# Patient Record
Sex: Female | Born: 1990 | Race: White | Hispanic: No | Marital: Single | Smoking: Former smoker
Health system: Southern US, Community
[De-identification: ages and names within clinical notes are randomized; demographics above are authoritative.]

## PROBLEM LIST (undated history)

## (undated) DIAGNOSIS — J189 Pneumonia, unspecified organism: Secondary | ICD-10-CM

## (undated) DIAGNOSIS — N390 Urinary tract infection, site not specified: Secondary | ICD-10-CM

## (undated) DIAGNOSIS — F419 Anxiety disorder, unspecified: Secondary | ICD-10-CM

## (undated) DIAGNOSIS — N200 Calculus of kidney: Secondary | ICD-10-CM

## (undated) DIAGNOSIS — D649 Anemia, unspecified: Secondary | ICD-10-CM

## (undated) DIAGNOSIS — J45909 Unspecified asthma, uncomplicated: Secondary | ICD-10-CM

## (undated) DIAGNOSIS — F32A Depression, unspecified: Secondary | ICD-10-CM

## (undated) DIAGNOSIS — G4730 Sleep apnea, unspecified: Secondary | ICD-10-CM

## (undated) DIAGNOSIS — F4310 Post-traumatic stress disorder, unspecified: Secondary | ICD-10-CM

## (undated) DIAGNOSIS — F329 Major depressive disorder, single episode, unspecified: Secondary | ICD-10-CM

## (undated) DIAGNOSIS — N189 Chronic kidney disease, unspecified: Secondary | ICD-10-CM

## (undated) DIAGNOSIS — F319 Bipolar disorder, unspecified: Secondary | ICD-10-CM

## (undated) HISTORY — PX: CARPAL TUNNEL RELEASE: SHX101

## (undated) HISTORY — PX: KNEE ARTHROSCOPY: SUR90

## (undated) HISTORY — DX: Calculus of kidney: N20.0

## (undated) HISTORY — DX: Urinary tract infection, site not specified: N39.0

## (undated) HISTORY — PX: BREAST SURGERY: SHX581

## (undated) HISTORY — PX: TONSILLECTOMY: SUR1361

---
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
## 2009-01-11 ENCOUNTER — Emergency Department (HOSPITAL_COMMUNITY): Admission: EM | Admit: 2009-01-11 | Discharge: 2009-01-11 | Payer: Self-pay | Admitting: Family Medicine
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
## 2010-09-26 NOTE — Op Note (Signed)
NAMEANALIZ, TVEDT             ACCOUNT NO.:  [id number]   MEDICAL RECORD NO.:  [id number]          PATIENT TYPE:  OIB   LOCATION:  6114                         FACILITY:  [facility]   PHYSICIAN:  Jefry H. Pollyann Darroch, MD     DATE OF BIRTH:  [birth year]   DATE OF PROCEDURE:  07/18/2004  DATE OF DISCHARGE:  07/18/2004                                 OPERATIVE REPORT   PREOPERATIVE DIAGNOSIS:  Chronic tonsillar and adenoid hypertrophy with  chronic tonsillitis.   POSTOPERATIVE DIAGNOSIS:  Chronic tonsillar and adenoid hypertrophy with  chronic tonsillitis.   PROCEDURES:  Tonsillectomy and adenoidectomy.   SURGEON:  Jefry H. Pollyann Knickerbocker, M.D.   ANESTHESIA:  General endotracheal anesthesia was used.   COMPLICATIONS:  None.   BLOOD LOSS:  Minimal.   FINDINGS:  Severe enlargement of the tonsils with cryptic spaces and  fibrosis and chronic inflammation surrounding the capsule.  Adenoids with  mild to moderate enlargement.   REFERRING PHYSICIAN:  [facility].   HISTORY:  A 20 year old with a history of drooling, difficulty swallowing,  snoring and chronic and recurring tonsillopharyngitis.  The risks, benefits  and complications of the procedure were explained to the mother, who seemed  to understand and agree to surgery.   DESCRIPTION OF PROCEDURE:  The patient was taken to the operating room and  placed on the operating table in the supine position.  Following induction  of general endotracheal anesthesia, the table was turned and the patient was  draped in a standard fashion.  The Crowe-Davis mouth gag was inserted into  the oral cavity and used to retract the tongue and mandible and attached to  the [facility] stand.  Inspection of the palate revealed no evidence of submucous  clots or shortening of the short palate.  A red rubber catheter was inserted  into the right side of the nose, withdrawn through the mouth and used to  retract the soft palate and uvula.  Indirect exam of the  nasopharynx was  performed and a large adenoid curet was used in a single mouth, removing the  majority of the adenoid tissue.  The nasopharynx was packed while the  tonsillectomy was performed.  Tonsillectomy was performed using  electrocautery dissection, carefully dissecting the avascular plane between  the capsular and constrictor muscles.  Spot cautery and suction cautery were  used as needed for completion of hemostasis.  The packing was removed from  the nasopharynx and suction cautery was used to provide hemostasis in the  nasopharynx as well.  The tonsils  and adenoid tissue were sent together for pathologic evaluation.  The  pharynx was suctioned of secretions, irrigated with saline solution and an  oral gastric tube was used to aspirate the contents of the stomach.  The  patient was then awakened, extubated and transferred to recovery in stable  condition.      JHR/MEDQ  D:  07/18/2004  T:  07/19/2004  Job:  [id number]   cc:   [facility]

## 2011-05-12 HISTORY — PX: BREAST SURGERY: SHX581

## 2013-02-01 ENCOUNTER — Encounter (HOSPITAL_BASED_OUTPATIENT_CLINIC_OR_DEPARTMENT_OTHER): Payer: Self-pay | Admitting: *Deleted

## 2013-02-01 ENCOUNTER — Emergency Department (HOSPITAL_BASED_OUTPATIENT_CLINIC_OR_DEPARTMENT_OTHER)
Admission: EM | Admit: 2013-02-01 | Discharge: 2013-02-01 | Disposition: A | Discharge status: Home / Self Care | Payer: Self-pay | Attending: Emergency Medicine | Admitting: Emergency Medicine

## 2013-02-01 DIAGNOSIS — Z3202 Encounter for pregnancy test, result negative: Secondary | ICD-10-CM | POA: Insufficient documentation

## 2013-02-01 DIAGNOSIS — R5381 Other malaise: Secondary | ICD-10-CM | POA: Insufficient documentation

## 2013-02-01 DIAGNOSIS — B349 Viral infection, unspecified: Secondary | ICD-10-CM

## 2013-02-01 DIAGNOSIS — E669 Obesity, unspecified: Secondary | ICD-10-CM | POA: Insufficient documentation

## 2013-02-01 DIAGNOSIS — F3289 Other specified depressive episodes: Secondary | ICD-10-CM | POA: Insufficient documentation

## 2013-02-01 DIAGNOSIS — F411 Generalized anxiety disorder: Secondary | ICD-10-CM | POA: Insufficient documentation

## 2013-02-01 DIAGNOSIS — F329 Major depressive disorder, single episode, unspecified: Secondary | ICD-10-CM | POA: Insufficient documentation

## 2013-02-01 DIAGNOSIS — K529 Noninfective gastroenteritis and colitis, unspecified: Secondary | ICD-10-CM

## 2013-02-01 DIAGNOSIS — A088 Other specified intestinal infections: Secondary | ICD-10-CM | POA: Insufficient documentation

## 2013-02-01 DIAGNOSIS — Z79899 Other long term (current) drug therapy: Secondary | ICD-10-CM | POA: Insufficient documentation

## 2013-02-01 HISTORY — DX: Anxiety disorder, unspecified: F41.9

## 2013-02-01 HISTORY — DX: Depression, unspecified: F32.A

## 2013-02-01 HISTORY — DX: Major depressive disorder, single episode, unspecified: F32.9

## 2013-02-01 LAB — URINE MICROSCOPIC-ADD ON

## 2013-02-01 LAB — CBC WITH DIFFERENTIAL/PLATELET
Eosinophils Absolute: 0.4 10*3/uL (ref 0.0–0.7)
Hemoglobin: 13.4 g/dL (ref 12.0–15.0)
Lymphs Abs: 3 10*3/uL (ref 0.7–4.0)
MCH: 29.5 pg (ref 26.0–34.0)
Monocytes Relative: 10 % (ref 3–12)
Neutro Abs: 5.9 10*3/uL (ref 1.7–7.7)
Neutrophils Relative %: 57 % (ref 43–77)
Platelets: 286 10*3/uL (ref 150–400)
RBC: 4.54 MIL/uL (ref 3.87–5.11)
WBC: 10.3 10*3/uL (ref 4.0–10.5)

## 2013-02-01 LAB — URINALYSIS, ROUTINE W REFLEX MICROSCOPIC
Glucose, UA: NEGATIVE mg/dL
Ketones, ur: NEGATIVE mg/dL
Nitrite: NEGATIVE
Specific Gravity, Urine: 1.015 (ref 1.005–1.030)
pH: 6 (ref 5.0–8.0)

## 2013-02-01 LAB — COMPREHENSIVE METABOLIC PANEL
ALT: 19 U/L (ref 0–35)
Albumin: 3.9 g/dL (ref 3.5–5.2)
Alkaline Phosphatase: 69 U/L (ref 39–117)
Chloride: 104 mEq/L (ref 96–112)
GFR calc Af Amer: >90 mL/min (ref >90)
Glucose, Bld: 106 mg/dL — ABNORMAL HIGH (ref 70–99)
Potassium: 4 mEq/L (ref 3.5–5.1)
Sodium: 137 mEq/L (ref 135–145)
Total Bilirubin: 0.1 mg/dL — ABNORMAL LOW (ref 0.3–1.2)
Total Protein: 6.7 g/dL (ref 6.0–8.3)

## 2013-02-01 LAB — PREGNANCY, URINE: Preg Test, Ur: NEGATIVE

## 2013-02-01 MED ORDER — PROMETHAZINE HCL 25 MG RE SUPP: 25.0000 mg | Freq: Four times a day (QID) | RECTAL | Status: DC | PRN

## 2013-02-01 MED ORDER — ONDANSETRON HCL 4 MG/2ML IJ SOLN
4.0000 mg | Freq: Once | INTRAMUSCULAR | Status: AC
  Administered 2013-02-01: 4 mg via INTRAVENOUS
  Filled 2013-02-01: qty 2

## 2013-02-01 MED ORDER — SODIUM CHLORIDE 0.9 % IV BOLUS (SEPSIS)
1000.0000 mL | Freq: Once | INTRAVENOUS | Status: AC
  Administered 2013-02-01: 1000 mL via INTRAVENOUS

## 2013-02-01 MED ORDER — KETOROLAC TROMETHAMINE 30 MG/ML IJ SOLN
30.0000 mg | Freq: Once | INTRAMUSCULAR | Status: AC
  Administered 2013-02-01: 30 mg via INTRAVENOUS
  Filled 2013-02-01: qty 1

## 2013-02-01 MED ORDER — PROMETHAZINE HCL 25 MG PO TABS: 25.0000 mg | ORAL_TABLET | Freq: Four times a day (QID) | ORAL | Status: DC | PRN

## 2013-02-01 NOTE — ED Notes (Signed)
Pt c/o diffuse abd pain n/v/d x 3 days

## 2013-02-01 NOTE — ED Provider Notes (Signed)
CSN: [id number]     Arrival date & time 02/01/13  1522 History   First MD Initiated Contact with Patient 02/01/13 1538     Chief Complaint  Patient presents with  . Abdominal Pain   (Consider location/radiation/quality/duration/timing/severity/associated sxs/prior Treatment) HPI Comments: Patient is a 22 year old female with past medical history significant for celiac disease. She presents today with a three-day history of worsening abdominal cramping with associated nausea, vomiting, and diarrhea. Does this has been nonbloody and she denies having any fevers. She denies vaginal discharge or bleeding. There is no dysuria she does report some lower back discomfort.  Patient is a 22 y.o. female presenting with abdominal pain. The history is provided by the patient.  Abdominal Pain Pain location:  Generalized Pain quality: cramping   Pain radiates to:  Does not radiate Pain severity:  Moderate Onset quality:  Gradual Duration:  3 days Timing:  Constant Progression:  Worsening Chronicity:  New Relieved by:  Nothing Worsened by:  Nothing tried Ineffective treatments:  None tried Associated symptoms: diarrhea, fatigue, nausea and vomiting   Associated symptoms: no chills, no dysuria and no fever     Past Medical History  Diagnosis Date  . Anxiety   . Depression    Past Surgical History  Procedure Laterality Date  . Breast surgery     History reviewed. No pertinent family history. History  Substance Use Topics  . Smoking status: Never Smoker   . Smokeless tobacco: Not on file  . Alcohol Use: No   OB History   Grav Para Term Preterm Abortions TAB SAB Ect Mult Living                 Review of Systems  Constitutional: Positive for fatigue. Negative for fever and chills.  Gastrointestinal: Positive for nausea, vomiting, abdominal pain and diarrhea.  Genitourinary: Negative for dysuria.  All other systems reviewed and are negative.    Allergies  Review of patient's  allergies indicates no known allergies.  Home Medications   Current Outpatient Rx  Name  Route  Sig  Dispense  Refill  . citalopram (CELEXA) 20 MG tablet   Oral   Take 20 mg by mouth daily.         . tadalafil (CIALIS) 10 MG tablet   Oral   Take 10 mg by mouth daily as needed for erectile dysfunction.         . traZODone (DESYREL) 100 MG tablet   Oral   Take 200 mg by mouth at bedtime.          BP 120/61  Pulse 84  Temp(Src) 98 F (36.7 C) (Oral)  Resp 20  Ht 5\' 6"  (1.676 m)  Wt 295 lb (133.811 kg)  BMI 47.64 kg/m2  SpO2 100% Physical Exam  Nursing note and vitals reviewed. Constitutional: She is oriented to person, place, and time. She appears well-developed and well-nourished.  The patient is an obese 22 year old female in no acute distress.  HENT:  Head: Normocephalic and atraumatic.  Neck: Normal range of motion. Neck supple.  Cardiovascular: Normal rate and regular rhythm.  Exam reveals no gallop and no friction rub.   No murmur heard. Pulmonary/Chest: Effort normal and breath sounds normal. No respiratory distress. She has no wheezes.  Abdominal: Soft. Bowel sounds are normal. She exhibits no distension. There is tenderness.  The abdomen is obese. There is tenderness to palpation across all 4 quadrants with no rebound or guarding. There is no rigidity of the abdominal  wall.  Musculoskeletal: Normal range of motion.  Neurological: She is alert and oriented to person, place, and time.  Skin: Skin is warm and dry. She is not diaphoretic.    ED Course  Procedures (including critical care time) Labs Review Labs Reviewed  PREGNANCY, URINE  URINALYSIS, ROUTINE W REFLEX MICROSCOPIC  CBC WITH DIFFERENTIAL  COMPREHENSIVE METABOLIC PANEL  LIPASE, BLOOD   Imaging Review No results found.  MDM  No diagnosis found. Patient presents here with a three-day history of generalized abdominal pain, nausea, vomiting, diarrhea. Is also complaining of cough and chest  congestion. She was given IV fluids and medication for pain and nausea. She is feeling better. Workup reveals no elevation of white count and electrolytes are otherwise unremarkable. I doubt there is an acute process in the abdomen such as appendicitis or acute cholecystitis and feels that she is stable for discharge with symptomatic treatment and fluids. She was given instructions for return and expresses an understanding of these.    Geoffery Lyons, MD 02/01/13 1800

## 2013-02-02 LAB — URINE CULTURE: Colony Count: 30000

## 2013-04-25 ENCOUNTER — Encounter (HOSPITAL_BASED_OUTPATIENT_CLINIC_OR_DEPARTMENT_OTHER): Payer: Self-pay | Admitting: Emergency Medicine

## 2013-04-25 ENCOUNTER — Emergency Department (HOSPITAL_BASED_OUTPATIENT_CLINIC_OR_DEPARTMENT_OTHER)
Admission: EM | Admit: 2013-04-25 | Discharge: 2013-04-25 | Disposition: A | Discharge status: Home / Self Care | Payer: Medicaid Other | Attending: Emergency Medicine | Admitting: Emergency Medicine

## 2013-04-25 DIAGNOSIS — H6692 Otitis media, unspecified, left ear: Secondary | ICD-10-CM

## 2013-04-25 DIAGNOSIS — J069 Acute upper respiratory infection, unspecified: Secondary | ICD-10-CM | POA: Insufficient documentation

## 2013-04-25 DIAGNOSIS — F329 Major depressive disorder, single episode, unspecified: Secondary | ICD-10-CM | POA: Insufficient documentation

## 2013-04-25 DIAGNOSIS — Z79899 Other long term (current) drug therapy: Secondary | ICD-10-CM | POA: Insufficient documentation

## 2013-04-25 DIAGNOSIS — F411 Generalized anxiety disorder: Secondary | ICD-10-CM | POA: Insufficient documentation

## 2013-04-25 DIAGNOSIS — F3289 Other specified depressive episodes: Secondary | ICD-10-CM | POA: Insufficient documentation

## 2013-04-25 DIAGNOSIS — H6690 Otitis media, unspecified, unspecified ear: Secondary | ICD-10-CM | POA: Insufficient documentation

## 2013-04-25 MED ORDER — GUAIFENESIN-CODEINE 100-10 MG/5ML PO SOLN: 10.0000 mL | Freq: Three times a day (TID) | ORAL | Status: DC | PRN

## 2013-04-25 MED ORDER — AMOXICILLIN 500 MG PO CAPS: 500.0000 mg | ORAL_CAPSULE | Freq: Three times a day (TID) | ORAL | Status: DC

## 2013-04-25 MED ORDER — AZITHROMYCIN 250 MG PO TABS: ORAL_TABLET | ORAL | Status: DC

## 2013-04-25 NOTE — ED Provider Notes (Signed)
CSN: [id number]     Arrival date & time 04/25/13  0815 History   First MD Initiated Contact with Patient 04/25/13 [phone]     Chief Complaint  Patient presents with  . Cough  . Nasal Congestion   (Consider location/radiation/quality/duration/timing/severity/associated sxs/prior Treatment) HPI Comments: Patient is a 22 year old female presents with complaints of a several day history of cough, congestion, and is now having left ear pain. She denies shortness of breath.  Patient is a 22 y.o. female presenting with cough. The history is provided by the patient.  Cough Cough characteristics:  Non-productive Severity:  Moderate Onset quality:  Gradual Duration:  2 days Timing:  Constant Progression:  Worsening Chronicity:  New Smoker: no   Relieved by:  Nothing Worsened by:  Nothing tried   Past Medical History  Diagnosis Date  . Anxiety   . Depression    Past Surgical History  Procedure Laterality Date  . Breast surgery     History reviewed. No pertinent family history. History  Substance Use Topics  . Smoking status: Never Smoker   . Smokeless tobacco: Not on file  . Alcohol Use: No   OB History   Grav Para Term Preterm Abortions TAB SAB Ect Mult Living                 Review of Systems  Respiratory: Positive for cough.   All other systems reviewed and are negative.    Allergies  Review of patient's allergies indicates no known allergies.  Home Medications   Current Outpatient Rx  Name  Route  Sig  Dispense  Refill  . citalopram (CELEXA) 20 MG tablet   Oral   Take 20 mg by mouth daily.         . promethazine (PHENERGAN) 25 MG tablet   Oral   Take 1 tablet (25 mg total) by mouth every 6 (six) hours as needed for nausea.   12 tablet   0   . tadalafil (CIALIS) 10 MG tablet   Oral   Take 10 mg by mouth daily as needed for erectile dysfunction.         . traZODone (DESYREL) 100 MG tablet   Oral   Take 200 mg by mouth at bedtime.          BP  133/64  Pulse 69  Temp(Src) 98.6 F (37 C) (Oral)  Resp 16  Ht 5\' 6"  (1.676 m)  Wt 250 lb (113.399 kg)  BMI 40.37 kg/m2  SpO2 99%  LMP 04/11/2013 Physical Exam  Nursing note and vitals reviewed. Constitutional: She is oriented to person, place, and time. She appears well-developed and well-nourished. No distress.  HENT:  Head: Normocephalic and atraumatic.  Mouth/Throat: Oropharynx is clear and moist.  The left tympanic membrane is erythematous and bulging.  Neck: Normal range of motion. Neck supple.  Cardiovascular: Normal rate and regular rhythm.  Exam reveals no gallop and no friction rub.   No murmur heard. Pulmonary/Chest: Effort normal and breath sounds normal. No respiratory distress. She has no wheezes.  Abdominal: Soft. Bowel sounds are normal. She exhibits no distension. There is no tenderness.  Musculoskeletal: Normal range of motion.  Lymphadenopathy:    She has no cervical adenopathy.  Neurological: She is alert and oriented to person, place, and time.  Skin: Skin is warm and dry. She is not diaphoretic.    ED Course  Procedures (including critical care time) Labs Review Labs Reviewed - No data to display Imaging Review No  results found.    MDM  No diagnosis found. This appears to be a viral upper respiratory infection with a secondary left otitis media. I will prescribe Zithromax and Robitussin with codeine. She is to followup as needed if she worsens.    Geoffery Lyons, MD 04/25/13 [phone]

## 2013-04-25 NOTE — ED Notes (Signed)
Pt amb to room 6 with quick steady gait in nad. Pt reports cough and nasal congestion x Monday.

## 2013-06-06 ENCOUNTER — Encounter (HOSPITAL_COMMUNITY): Payer: Self-pay | Admitting: Emergency Medicine

## 2013-06-06 ENCOUNTER — Emergency Department (HOSPITAL_COMMUNITY)
Admission: EM | Admit: 2013-06-06 | Discharge: 2013-06-06 | Disposition: A | Discharge status: Home / Self Care | Payer: Medicaid Other | Attending: Emergency Medicine | Admitting: Emergency Medicine

## 2013-06-06 ENCOUNTER — Emergency Department (HOSPITAL_COMMUNITY): Payer: Medicaid Other

## 2013-06-06 DIAGNOSIS — IMO0001 Reserved for inherently not codable concepts without codable children: Secondary | ICD-10-CM | POA: Insufficient documentation

## 2013-06-06 DIAGNOSIS — M2550 Pain in unspecified joint: Secondary | ICD-10-CM | POA: Insufficient documentation

## 2013-06-06 DIAGNOSIS — F3289 Other specified depressive episodes: Secondary | ICD-10-CM | POA: Insufficient documentation

## 2013-06-06 DIAGNOSIS — F329 Major depressive disorder, single episode, unspecified: Secondary | ICD-10-CM | POA: Insufficient documentation

## 2013-06-06 DIAGNOSIS — J069 Acute upper respiratory infection, unspecified: Secondary | ICD-10-CM

## 2013-06-06 DIAGNOSIS — F411 Generalized anxiety disorder: Secondary | ICD-10-CM | POA: Insufficient documentation

## 2013-06-06 DIAGNOSIS — Z79899 Other long term (current) drug therapy: Secondary | ICD-10-CM | POA: Insufficient documentation

## 2013-06-06 MED ORDER — PREDNISONE 10 MG PO TABS: ORAL_TABLET | ORAL | Status: DC

## 2013-06-06 MED ORDER — PREDNISONE 50 MG PO TABS
60.0000 mg | ORAL_TABLET | Freq: Once | ORAL | Status: AC
  Administered 2013-06-06: 60 mg via ORAL
  Filled 2013-06-06 (×2): qty 1

## 2013-06-06 MED ORDER — IBUPROFEN 800 MG PO TABS: 800.0000 mg | ORAL_TABLET | Freq: Three times a day (TID) | ORAL | Status: DC

## 2013-06-06 MED ORDER — IBUPROFEN 800 MG PO TABS
800.0000 mg | ORAL_TABLET | Freq: Once | ORAL | Status: AC
  Administered 2013-06-06: 800 mg via ORAL
  Filled 2013-06-06: qty 1

## 2013-06-06 MED ORDER — PROMETHAZINE-CODEINE 6.25-10 MG/5ML PO SYRP: 5.0000 mL | ORAL_SOLUTION | Freq: Four times a day (QID) | ORAL | Status: DC | PRN

## 2013-06-06 MED ORDER — PSEUDOEPHEDRINE HCL ER 120 MG PO TB12: 120.0000 mg | ORAL_TABLET | Freq: Two times a day (BID) | ORAL | Status: DC

## 2013-06-06 MED ORDER — HYDROCOD POLST-CHLORPHEN POLST 10-8 MG/5ML PO LQCR
5.0000 mL | Freq: Once | ORAL | Status: AC
  Administered 2013-06-06: 5 mL via ORAL
  Filled 2013-06-06: qty 5

## 2013-06-06 NOTE — ED Notes (Signed)
Reports seen for URI last month with recurrent infections.  States reports coughing up red mucous with sob and pain with deep breath x 2 wks.

## 2013-06-06 NOTE — Discharge Instructions (Signed)
Chest x-ray is negative for pneumonia or acute problems. Please increase fluids. Please wash hands frequently. Please use prednisone Sudafed and ibuprofen daily area use promethazine cough medication every 6  hours as needed for cough. This medication may cause drowsiness, please use with caution.  Upper Respiratory Infection, Adult An upper respiratory infection (URI) is also sometimes known as the common cold. The upper respiratory tract includes the nose, sinuses, throat, trachea, and bronchi. Bronchi are the airways leading to the lungs. Most people improve within 1 week, but symptoms can last up to 2 weeks. A residual cough may last even longer.  CAUSES Many different viruses can infect the tissues lining the upper respiratory tract. The tissues become irritated and inflamed and often become very moist. Mucus production is also common. A cold is contagious. You can easily spread the virus to others by oral contact. This includes kissing, sharing a glass, coughing, or sneezing. Touching your mouth or nose and then touching a surface, which is then touched by another person, can also spread the virus. SYMPTOMS  Symptoms typically develop 1 to 3 days after you come in contact with a cold virus. Symptoms vary from person to person. They may include:  Runny nose.  Sneezing.  Nasal congestion.  Sinus irritation.  Sore throat.  Loss of voice (laryngitis).  Cough.  Fatigue.  Muscle aches.  Loss of appetite.  Headache.  Low-grade fever. DIAGNOSIS  You might diagnose your own cold based on familiar symptoms, since most people get a cold 2 to 3 times a year. Your caregiver can confirm this based on your exam. Most importantly, your caregiver can check that your symptoms are not due to another disease such as strep throat, sinusitis, pneumonia, asthma, or epiglottitis. Blood tests, throat tests, and X-rays are not necessary to diagnose a common cold, but they may sometimes be helpful in  excluding other more serious diseases. Your caregiver will decide if any further tests are required. RISKS AND COMPLICATIONS  You may be at risk for a more severe case of the common cold if you smoke cigarettes, have chronic heart disease (such as heart failure) or lung disease (such as asthma), or if you have a weakened immune system. The very young and very old are also at risk for more serious infections. Bacterial sinusitis, middle ear infections, and bacterial pneumonia can complicate the common cold. The common cold can worsen asthma and chronic obstructive pulmonary disease (COPD). Sometimes, these complications can require emergency medical care and may be life-threatening. PREVENTION  The best way to protect against getting a cold is to practice good hygiene. Avoid oral or hand contact with people with cold symptoms. Wash your hands often if contact occurs. There is no clear evidence that vitamin C, vitamin E, echinacea, or exercise reduces the chance of developing a cold. However, it is always recommended to get plenty of rest and practice good nutrition. TREATMENT  Treatment is directed at relieving symptoms. There is no cure. Antibiotics are not effective, because the infection is caused by a virus, not by bacteria. Treatment may include:  Increased fluid intake. Sports drinks offer valuable electrolytes, sugars, and fluids.  Breathing heated mist or steam (vaporizer or shower).  Eating chicken soup or other clear broths, and maintaining good nutrition.  Getting plenty of rest.  Using gargles or lozenges for comfort.  Controlling fevers with ibuprofen or acetaminophen as directed by your caregiver.  Increasing usage of your inhaler if you have asthma. Zinc gel and zinc lozenges,  taken in the first 24 hours of the common cold, can shorten the duration and lessen the severity of symptoms. Pain medicines may help with fever, muscle aches, and throat pain. A variety of non-prescription  medicines are available to treat congestion and runny nose. Your caregiver can make recommendations and may suggest nasal or lung inhalers for other symptoms.  HOME CARE INSTRUCTIONS   Only take over-the-counter or prescription medicines for pain, discomfort, or fever as directed by your caregiver.  Use a warm mist humidifier or inhale steam from a shower to increase air moisture. This may keep secretions moist and make it easier to breathe.  Drink enough water and fluids to keep your urine clear or pale yellow.  Rest as needed.  Return to work when your temperature has returned to normal or as your caregiver advises. You may need to stay home longer to avoid infecting others. You can also use a face mask and careful hand washing to prevent spread of the virus. SEEK MEDICAL CARE IF:   After the first few days, you feel you are getting worse rather than better.  You need your caregiver's advice about medicines to control symptoms.  You develop chills, worsening shortness of breath, or brown or red sputum. These may be signs of pneumonia.  You develop yellow or brown nasal discharge or pain in the face, especially when you bend forward. These may be signs of sinusitis.  You develop a fever, swollen neck glands, pain with swallowing, or white areas in the back of your throat. These may be signs of strep throat. SEEK IMMEDIATE MEDICAL CARE IF:   You have a fever.  You develop severe or persistent headache, ear pain, sinus pain, or chest pain.  You develop wheezing, a prolonged cough, cough up blood, or have a change in your usual mucus (if you have chronic lung disease).  You develop sore muscles or a stiff neck. Document Released: 10/21/2000 Document Revised: 07/20/2011 Document Reviewed: 08/29/2010 [facility] Patient Information 2014 [place], [place].

## 2013-06-06 NOTE — ED Provider Notes (Signed)
CSN: [id number]     Arrival date & time 06/06/13  1214 History   First MD Initiated Contact with Patient 06/06/13 1419     Chief Complaint  Patient presents with  . URI   (Consider location/radiation/quality/duration/timing/severity/associated sxs/prior Treatment) Patient is a 23 y.o. female presenting with URI. The history is provided by the patient.  URI Presenting symptoms: congestion and cough   Severity:  Moderate Onset quality:  Gradual Duration:  2 weeks Timing:  Intermittent Progression:  Worsening Chronicity:  New Relieved by:  Nothing Worsened by:  Breathing and movement Associated symptoms: arthralgias, myalgias, sinus pain and wheezing   Associated symptoms: no neck pain   Associated symptoms comment:  Chills Risk factors: recent illness   Risk factors: no diabetes mellitus and no recent travel     Past Medical History  Diagnosis Date  . Anxiety   . Depression    Past Surgical History  Procedure Laterality Date  . Breast surgery     No family history on file. History  Substance Use Topics  . Smoking status: Never Smoker   . Smokeless tobacco: Not on file  . Alcohol Use: No   OB History   Grav Para Term Preterm Abortions TAB SAB Ect Mult Living                 Review of Systems  Constitutional: Negative for activity change.       All ROS Neg except as noted in HPI  HENT: Positive for congestion. Negative for nosebleeds.   Eyes: Negative for photophobia and discharge.  Respiratory: Positive for cough and wheezing. Negative for shortness of breath.   Cardiovascular: Negative for chest pain and palpitations.  Gastrointestinal: Negative for abdominal pain and blood in stool.  Genitourinary: Negative for dysuria, frequency and hematuria.  Musculoskeletal: Positive for arthralgias and myalgias. Negative for back pain and neck pain.  Skin: Negative.   Neurological: Negative for dizziness, seizures and speech difficulty.  Psychiatric/Behavioral: Negative  for hallucinations and confusion. The patient is nervous/anxious.     Allergies  Review of patient's allergies indicates no known allergies.  Home Medications   Current Outpatient Rx  Name  Route  Sig  Dispense  Refill  . albuterol (PROVENTIL HFA;VENTOLIN HFA) 108 (90 BASE) MCG/ACT inhaler   Inhalation   Inhale 2 puffs into the lungs every 6 (six) hours as needed for wheezing or shortness of breath.         . traZODone (DESYREL) 100 MG tablet   Oral   Take 200 mg by mouth at bedtime.         . citalopram (CELEXA) 20 MG tablet   Oral   Take 20 mg by mouth daily.          BP 100/60  Pulse 74  Temp(Src) 98.2 F (36.8 C) (Oral)  Resp 18  Ht 5\' 6"  (1.676 m)  Wt 250 lb (113.399 kg)  BMI 40.37 kg/m2  SpO2 100%  LMP 04/24/2013 Physical Exam  Nursing note and vitals reviewed. Constitutional: She is oriented to person, place, and time. She appears well-developed and well-nourished.  Non-toxic appearance. She appears ill.  HENT:  Head: Normocephalic.  Right Ear: Tympanic membrane and external ear normal.  Left Ear: Tympanic membrane and external ear normal.  Nasal congestion present.  Eyes: EOM and lids are normal. Pupils are equal, round, and reactive to light.  Neck: Normal range of motion. Neck supple. Carotid bruit is not present.  Cardiovascular: Normal rate, regular rhythm,  normal heart sounds, intact distal pulses and normal pulses.   Pulmonary/Chest: Breath sounds normal. No respiratory distress. She has no wheezes. She exhibits tenderness.  Chaperone present during examination.  Abdominal: Soft. Bowel sounds are normal. There is no tenderness. There is no guarding.  Musculoskeletal: Normal range of motion.  Lymphadenopathy:       Head (right side): No submandibular adenopathy present.       Head (left side): No submandibular adenopathy present.    She has no cervical adenopathy.  Neurological: She is alert and oriented to person, place, and time. She has normal  strength. No cranial nerve deficit or sensory deficit. She exhibits normal muscle tone. Coordination normal.  Patient is ambulatory without problem.  Skin: Skin is warm and dry.  Psychiatric: She has a normal mood and affect. Her speech is normal.    ED Course  Procedures (including critical care time) Labs Review Labs Reviewed - No data to display Imaging Review Dg Chest 2 View  06/06/2013   CLINICAL DATA:  Recurrent infection cough  EXAM: CHEST  2 VIEW  COMPARISON:  None.  FINDINGS: Cardiomediastinal silhouette is unremarkable. No acute infiltrate or pleural effusion. No pulmonary edema.  IMPRESSION: No active cardiopulmonary disease.   Electronically Signed   By: Natasha Mead M.D.   On: 06/06/2013 13:17    EKG Interpretation   None       MDM  No diagnosis found. *I have reviewed nursing notes, vital signs, and all appropriate lab and imaging results for this patient.**  Chest x-ray is negative for acute changes. Pulse oximetry is 100% on room air. Within normal limits by my interpretation. Suspect patient has an upper respiratory infection. Patient will be treated with ibuprofen 800 mg, prednisone taper, promethazine-codeine cough medication, and Sudafed. Patient is to see her primary physician if any changes or problems.  Kathie Dike, PA-C 06/06/13 1500

## 2013-06-07 NOTE — ED Provider Notes (Signed)
Medical screening examination/treatment/procedure(s) were performed by non-physician practitioner and as supervising physician I was immediately available for consultation/collaboration.  EKG Interpretation   None         Laray AngerKathleen M Jamareon Shimel, DO 06/07/13 1738

## 2013-07-23 ENCOUNTER — Encounter (HOSPITAL_COMMUNITY): Payer: Self-pay | Admitting: Emergency Medicine

## 2013-07-23 ENCOUNTER — Emergency Department (HOSPITAL_COMMUNITY)
Admission: EM | Admit: 2013-07-23 | Discharge: 2013-07-25 | Disposition: A | Discharge status: Other Acute Inpatient Hospital | Payer: Medicaid Other | Attending: Emergency Medicine | Admitting: Emergency Medicine

## 2013-07-23 DIAGNOSIS — F3289 Other specified depressive episodes: Secondary | ICD-10-CM | POA: Insufficient documentation

## 2013-07-23 DIAGNOSIS — R45851 Suicidal ideations: Secondary | ICD-10-CM

## 2013-07-23 DIAGNOSIS — F4310 Post-traumatic stress disorder, unspecified: Secondary | ICD-10-CM | POA: Insufficient documentation

## 2013-07-23 DIAGNOSIS — Z3202 Encounter for pregnancy test, result negative: Secondary | ICD-10-CM | POA: Insufficient documentation

## 2013-07-23 DIAGNOSIS — F411 Generalized anxiety disorder: Secondary | ICD-10-CM | POA: Insufficient documentation

## 2013-07-23 DIAGNOSIS — Z79899 Other long term (current) drug therapy: Secondary | ICD-10-CM | POA: Insufficient documentation

## 2013-07-23 DIAGNOSIS — F329 Major depressive disorder, single episode, unspecified: Secondary | ICD-10-CM | POA: Insufficient documentation

## 2013-07-23 DIAGNOSIS — F32A Depression, unspecified: Secondary | ICD-10-CM

## 2013-07-23 HISTORY — DX: Post-traumatic stress disorder, unspecified: F43.10

## 2013-07-23 LAB — URINALYSIS, ROUTINE W REFLEX MICROSCOPIC
Bilirubin Urine: NEGATIVE
Glucose, UA: NEGATIVE mg/dL
Ketones, ur: NEGATIVE mg/dL
Leukocytes, UA: NEGATIVE
Nitrite: NEGATIVE
PROTEIN: NEGATIVE mg/dL
Specific Gravity, Urine: <1.005 — ABNORMAL LOW (ref 1.005–1.030)
Urobilinogen, UA: 0.2 mg/dL (ref 0.0–1.0)
pH: 6 (ref 5.0–8.0)

## 2013-07-23 LAB — ACETAMINOPHEN LEVEL: Acetaminophen (Tylenol), Serum: <15 ug/mL (ref 10–30)

## 2013-07-23 LAB — COMPREHENSIVE METABOLIC PANEL
ALT: 19 U/L (ref 0–35)
AST: 16 U/L (ref 0–37)
Albumin: 3.7 g/dL (ref 3.5–5.2)
Alkaline Phosphatase: 85 U/L (ref 39–117)
BUN: 12 mg/dL (ref 6–23)
CALCIUM: 9.3 mg/dL (ref 8.4–10.5)
CO2: 25 mEq/L (ref 19–32)
Chloride: 103 mEq/L (ref 96–112)
Creatinine, Ser: 0.7 mg/dL (ref 0.50–1.10)
GFR calc Af Amer: >90 mL/min (ref >90)
GLUCOSE: 114 mg/dL — AB (ref 70–99)
Potassium: 3.9 mEq/L (ref 3.7–5.3)
SODIUM: 139 meq/L (ref 137–147)
Total Bilirubin: 0.2 mg/dL — ABNORMAL LOW (ref 0.3–1.2)
Total Protein: 7.3 g/dL (ref 6.0–8.3)

## 2013-07-23 LAB — RAPID URINE DRUG SCREEN, HOSP PERFORMED
Amphetamines: NOT DETECTED
Barbiturates: NOT DETECTED
Benzodiazepines: NOT DETECTED
Cocaine: NOT DETECTED
OPIATES: NOT DETECTED
Tetrahydrocannabinol: NOT DETECTED

## 2013-07-23 LAB — CBC WITH DIFFERENTIAL/PLATELET
Basophils Absolute: 0 10*3/uL (ref 0.0–0.1)
Basophils Relative: 0 % (ref 0–1)
EOS PCT: 5 % (ref 0–5)
Eosinophils Absolute: 0.6 10*3/uL (ref 0.0–0.7)
HCT: 38.6 % (ref 36.0–46.0)
HEMOGLOBIN: 13.2 g/dL (ref 12.0–15.0)
LYMPHS ABS: 3 10*3/uL (ref 0.7–4.0)
LYMPHS PCT: 26 % (ref 12–46)
MCH: 30.6 pg (ref 26.0–34.0)
MCHC: 34.2 g/dL (ref 30.0–36.0)
MCV: 89.4 fL (ref 78.0–100.0)
MONOS PCT: 9 % (ref 3–12)
Monocytes Absolute: 1 10*3/uL (ref 0.1–1.0)
Neutro Abs: 6.8 10*3/uL (ref 1.7–7.7)
Neutrophils Relative %: 60 % (ref 43–77)
Platelets: 285 10*3/uL (ref 150–400)
RBC: 4.32 MIL/uL (ref 3.87–5.11)
RDW: 12.5 % (ref 11.5–15.5)
WBC: 11.4 10*3/uL — AB (ref 4.0–10.5)

## 2013-07-23 LAB — ETHANOL: Alcohol, Ethyl (B): <11 mg/dL (ref 0–11)

## 2013-07-23 LAB — POC URINE PREG, ED: PREG TEST UR: NEGATIVE

## 2013-07-23 LAB — URINE MICROSCOPIC-ADD ON

## 2013-07-23 LAB — SALICYLATE LEVEL: SALICYLATE LVL: 0.4 mg/dL — AB (ref 2.8–20.0)

## 2013-07-23 MED ORDER — LORAZEPAM 1 MG PO TABS
1.0000 mg | ORAL_TABLET | Freq: Three times a day (TID) | ORAL | Status: DC | PRN
Start: 2013-07-23 — End: 2013-07-25
  Administered 2013-07-24: 1 mg via ORAL
  Filled 2013-07-23: qty 1

## 2013-07-23 MED ORDER — IBUPROFEN 800 MG PO TABS
800.0000 mg | ORAL_TABLET | Freq: Once | ORAL | Status: AC
  Administered 2013-07-23: 800 mg via ORAL
  Filled 2013-07-23: qty 1
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
## 2013-07-23 NOTE — ED Notes (Signed)
Spoke with Ala DachFord from TSS. States they are recommending inpatient placement, [facility] is full at present but they will send out referrals.
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
## 2013-07-23 NOTE — ED Notes (Signed)
TSS consult underway. 

## 2013-07-23 NOTE — BH Assessment (Signed)
Received call for assessment. Spoke with Kaitlyn Munchobert Lockwood, MD who said Pt is suicidal and appears to have psychotic symptoms. Tele-assessment will be initiated.  Harlin RainFord Ellis Ria CommentWarrick Jr, LPC, [facility] Triage Specialist
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
## 2013-07-24 MED ORDER — IBUPROFEN 800 MG PO TABS
800.0000 mg | ORAL_TABLET | Freq: Once | ORAL | Status: AC
  Administered 2013-07-24: 800 mg via ORAL
  Filled 2013-07-24: qty 1

## 2013-07-24 MED ORDER — CITALOPRAM HYDROBROMIDE 20 MG PO TABS
20.0000 mg | ORAL_TABLET | Freq: Every day | ORAL | Status: DC
  Administered 2013-07-24 – 2013-07-25 (×2): 20 mg via ORAL
  Filled 2013-07-24 (×4): qty 1

## 2013-07-24 MED ORDER — TRAZODONE HCL 50 MG PO TABS
150.0000 mg | ORAL_TABLET | Freq: Every day | ORAL | Status: DC
  Administered 2013-07-24 (×2): 150 mg via ORAL
  Filled 2013-07-24 (×2): qty 3

## 2013-07-24 NOTE — ED Notes (Signed)
Pt to shower with sitter.  Pt cooperative.  nad noted

## 2013-07-24 NOTE — Progress Notes (Signed)
MHT initiated inpatient placement at the following hospitals:  1)[facility]-faxed referral 2)[facility]-no beds 3)[facility]-faxed referral 4)[facility]-no beds 5)[facility]-faxed referral 6)[facility] beds 7)[facility] beds 8)[facility]no beds 9)[facility]-no beds 10)[facility]-no beds 11)[facility]-faxed referral 12)[facility]-faxed referral 13)[facility]-faxed referral 14)[facility]-faxed referral 15)[facility]-faxed referral  MHT spoke with RN to communicate placement search is still taking place.  Donnella ShamMichelle L Chao Blazejewski,MHT/NS

## 2013-07-24 NOTE — ED Notes (Addendum)
Patient states that she has low BP. Patient alert at this time. Sitting up in bed talking and eating peanut butter and graham. crackers.

## 2013-07-25 ENCOUNTER — Encounter (HOSPITAL_COMMUNITY): Payer: Self-pay | Admitting: Intensive Care

## 2013-07-25 ENCOUNTER — Inpatient Hospital Stay (HOSPITAL_COMMUNITY)
Admission: AD | Admit: 2013-07-25 | Discharge: 2013-07-28 | DRG: 885 | Disposition: A | Discharge status: Home / Self Care | Payer: MEDICAID | Source: Intra-hospital | Attending: Psychiatry | Admitting: Psychiatry

## 2013-07-25 DIAGNOSIS — F4310 Post-traumatic stress disorder, unspecified: Secondary | ICD-10-CM | POA: Diagnosis present

## 2013-07-25 DIAGNOSIS — F410 Panic disorder [episodic paroxysmal anxiety] without agoraphobia: Secondary | ICD-10-CM | POA: Diagnosis present

## 2013-07-25 DIAGNOSIS — G4700 Insomnia, unspecified: Secondary | ICD-10-CM | POA: Diagnosis present

## 2013-07-25 DIAGNOSIS — Z5987 Material hardship due to limited financial resources, not elsewhere classified: Secondary | ICD-10-CM

## 2013-07-25 DIAGNOSIS — F319 Bipolar disorder, unspecified: Secondary | ICD-10-CM | POA: Diagnosis present

## 2013-07-25 DIAGNOSIS — Z598 Other problems related to housing and economic circumstances: Secondary | ICD-10-CM

## 2013-07-25 DIAGNOSIS — R45851 Suicidal ideations: Secondary | ICD-10-CM

## 2013-07-25 DIAGNOSIS — R45850 Homicidal ideations: Secondary | ICD-10-CM

## 2013-07-25 DIAGNOSIS — F332 Major depressive disorder, recurrent severe without psychotic features: Principal | ICD-10-CM | POA: Diagnosis present

## 2013-07-25 DIAGNOSIS — F411 Generalized anxiety disorder: Secondary | ICD-10-CM | POA: Diagnosis present

## 2013-07-25 MED ORDER — ALUM & MAG HYDROXIDE-SIMETH 200-200-20 MG/5ML PO SUSP: 30.0000 mL | ORAL | Status: DC | PRN

## 2013-07-25 MED ORDER — ACETAMINOPHEN 325 MG PO TABS: 650.0000 mg | ORAL_TABLET | Freq: Four times a day (QID) | ORAL | Status: DC | PRN

## 2013-07-25 MED ORDER — MAGNESIUM HYDROXIDE 400 MG/5ML PO SUSP: 30.0000 mL | Freq: Every day | ORAL | Status: DC | PRN

## 2013-07-25 MED ORDER — TRAZODONE HCL 150 MG PO TABS
150.0000 mg | ORAL_TABLET | Freq: Every evening | ORAL | Status: DC | PRN
  Administered 2013-07-25 – 2013-07-26 (×2): 150 mg via ORAL
  Filled 2013-07-25 (×2): qty 1

## 2013-07-25 MED ORDER — HYDROXYZINE HCL 25 MG PO TABS
25.0000 mg | ORAL_TABLET | Freq: Three times a day (TID) | ORAL | Status: DC | PRN
  Administered 2013-07-25: 25 mg via ORAL
  Filled 2013-07-25: qty 1

## 2013-07-25 MED ORDER — ACETAMINOPHEN 325 MG PO TABS
650.0000 mg | ORAL_TABLET | Freq: Once | ORAL | Status: DC
Start: 2013-07-25 — End: 2013-07-25
  Filled 2013-07-25: qty 2

## 2013-07-25 MED ORDER — TRAZODONE HCL 50 MG PO TABS
50.0000 mg | ORAL_TABLET | Freq: Every evening | ORAL | Status: DC | PRN
  Filled 2013-07-25: qty 1

## 2013-07-25 NOTE — Tx Team (Signed)
Initial Interdisciplinary Treatment Plan  PATIENT STRENGTHS: (choose at least two) Ability for insight Active sense of humor Average or above average intelligence Capable of independent living Communication skills Motivation for treatment/growth  PATIENT STRESSORS: Financial difficulties Marital or family conflict Medication change or noncompliance Occupational concerns   PROBLEM LIST: Problem List/Patient Goals Date to be addressed Date deferred Reason deferred Estimated date of resolution  Depression 07/25/13                                                      DISCHARGE CRITERIA:  Ability to meet basic life and health needs Adequate post-discharge living arrangements Improved stabilization in mood, thinking, and/or behavior Medical problems require only outpatient monitoring  PRELIMINARY DISCHARGE PLAN: Attend aftercare/continuing care group Outpatient therapy  PATIENT/FAMIILY INVOLVEMENT: This treatment plan has been presented to and reviewed with the patient, Kaitlyn Luna.  Nestor RampDUNCAN, Cosandra Plouffe MCCOLLUM 07/25/2013, 2:50 PM

## 2013-07-25 NOTE — Progress Notes (Signed)
Recreation Therapy Notes  Animal-Assisted Activity/Therapy (AAA/T) Program Checklist/Progress Notes Patient Eligibility Criteria Checklist & Daily Group note for Rec Tx Intervention  Date: 03.17.2015 Time: 2:45am [address]   AAA/T Program Assumption of Risk Form signed by Patient/ or Parent Legal Guardian yes  Patient is free of allergies or sever asthma yes  Patient reports no fear of animals yes  Patient reports no history of cruelty to animals yes   Patient understands his/her participation is voluntary yes  Patient washes hands before animal contact yes  Patient washes hands after animal contact yes  Behavioral Response: Engaged, Appropriate   Education: Charity fundraiserHand Washing, Appropriate Animal Interaction   Education Outcome: Acknowledges understanding   Clinical Observations/Feedback: Patient interacted appropriately with therapy dog team.   Jearl Klinefelterenise L Myrlene Riera, LRT/CTRS  Aarionna Germer L 07/25/2013 4:08 PM

## 2013-07-25 NOTE — ED Notes (Signed)
Spoke with [facility] through conference call and was told she will be  considered for a 500 hall bed if they have any discharges but would be looking at other facilities.

## 2013-07-25 NOTE — ED Notes (Signed)
Pt talking with staff, states she's tired of being here, wants to get admitted. Unrealistic expectations of process. Explained to pt the reasons for delay. "I know, I know. I'm just tired of waiting around". Discussed with pt plan of medication schedule. States she wants something to help her relax now and something for a headache. Will give ativan now and ibuprofen with pm meds. Pt agreeable

## 2013-07-25 NOTE — Progress Notes (Signed)
Patient tried to call her dad to bring clothes to her and could not reach her dad.  Denied SI and HI.  Denied A/V hallucinations.  Patient went to recreation this afternoon and dining room for dinner.  Patient has been cooperative and pleasant.

## 2013-07-25 NOTE — ED Provider Notes (Signed)
BP 104/49  Pulse 66  Temp(Src) 97.8 F (36.6 C) (Oral)  Resp 16  Ht 5\' 6"  (1.676 m)  Wt 293 lb 8 oz (133.131 kg)  BMI 47.39 kg/m2  SpO2 98% Pt currently stable, awaiting placement Vitals/labs reviewed   Joya Gaskinsonald W Saba Gomm, MD 07/25/13 [phone]

## 2013-07-26 DIAGNOSIS — F332 Major depressive disorder, recurrent severe without psychotic features: Secondary | ICD-10-CM | POA: Diagnosis present

## 2013-07-26 DIAGNOSIS — R45850 Homicidal ideations: Secondary | ICD-10-CM

## 2013-07-26 DIAGNOSIS — R45851 Suicidal ideations: Secondary | ICD-10-CM

## 2013-07-26 DIAGNOSIS — F4310 Post-traumatic stress disorder, unspecified: Secondary | ICD-10-CM | POA: Diagnosis present

## 2013-07-26 MED ORDER — ALUM & MAG HYDROXIDE-SIMETH 200-200-20 MG/5ML PO SUSP
30.0000 mL | ORAL | Status: DC | PRN
  Administered 2013-07-27: 30 mL via ORAL

## 2013-07-26 MED ORDER — MAGNESIUM HYDROXIDE 400 MG/5ML PO SUSP: 30.0000 mL | Freq: Every day | ORAL | Status: DC | PRN

## 2013-07-26 MED ORDER — CIPROFLOXACIN HCL 500 MG PO TABS
500.0000 mg | ORAL_TABLET | Freq: Two times a day (BID) | ORAL | Status: DC
  Administered 2013-07-26 – 2013-07-28 (×4): 500 mg via ORAL
  Filled 2013-07-26 (×2): qty 1
  Filled 2013-07-26: qty 5
  Filled 2013-07-26: qty 1
  Filled 2013-07-26: qty 2
  Filled 2013-07-26 (×2): qty 1
  Filled 2013-07-26: qty 5
  Filled 2013-07-26: qty 1

## 2013-07-26 MED ORDER — CIPROFLOXACIN HCL 500 MG PO TABS: 500.0000 mg | ORAL_TABLET | Freq: Two times a day (BID) | ORAL | Status: DC

## 2013-07-26 MED ORDER — ACETAMINOPHEN 325 MG PO TABS
650.0000 mg | ORAL_TABLET | Freq: Four times a day (QID) | ORAL | Status: DC | PRN
  Administered 2013-07-26 – 2013-07-27 (×2): 650 mg via ORAL
  Filled 2013-07-26 (×2): qty 2

## 2013-07-26 MED ORDER — CITALOPRAM HYDROBROMIDE 20 MG PO TABS
20.0000 mg | ORAL_TABLET | Freq: Every day | ORAL | Status: DC
  Administered 2013-07-26 – 2013-07-27 (×2): 20 mg via ORAL
  Filled 2013-07-26: qty 1
  Filled 2013-07-26: qty 3
  Filled 2013-07-26 (×2): qty 1

## 2013-07-26 NOTE — Progress Notes (Signed)
Adult Psychoeducational Group Note  Date:  07/26/2013 Time:  9:37 PM  Group Topic/Focus:  Wrap-Up Group:   The focus of this group is to help patients review their daily goal of treatment and discuss progress on daily workbooks.  Participation Level:  Active  Participation Quality:  Appropriate  Affect:  Appropriate  Cognitive:  Appropriate  Insight: Appropriate  Engagement in Group:  Engaged  Modes of Intervention:  Discussion  Additional Comments:  The patient expressed that she had a mental breakdown today.The patient said she was upset with staff and that made her day bad.  Octavio Mannshigpen, Deangleo Passage Lee 07/26/2013, 9:37 PM

## 2013-07-26 NOTE — [facility] Suicide Risk Assessment (Signed)
Suicide Risk Assessment  Admission Assessment     Nursing information obtained from:    Demographic factors:    Current Mental Status:    Loss Factors:    Historical Factors:    Risk Reduction Factors:    Total Time spent with patient: 45 minutes  CLINICAL FACTORS:   Severe Anxiety and/or Agitation Depression:   Aggression Hopelessness Impulsivity Insomnia Recent sense of peace/wellbeing Severe Unstable or Poor Therapeutic Relationship Previous Psychiatric Diagnoses and Treatments Medical Diagnoses and Treatments/Surgeries  Psychiatric Specialty Exam:     Blood pressure 108/70, pulse 103, temperature 98.2 F (36.8 C), temperature source Oral, resp. rate 18, height 5\' 6"  (1.676 m), weight 132.904 kg (293 lb), SpO2 98.00%.Body mass index is 47.31 kg/(m^2).  General Appearance: Fairly Groomed  Patent attorneyye Contact::  Good  Speech:  Clear and Coherent and Slow  Volume:  Decreased  Mood:  Angry, Anxious, Depressed, Hopeless and Worthless  Affect:  Congruent and Depressed  Thought Process:  Goal Directed and Intact  Orientation:  Full (Time, Place, and Person)  Thought Content:  Rumination  Suicidal Thoughts:  Yes.  with intent/plan  Homicidal Thoughts:  Yes.  with intent/plan  Memory:  Immediate;   Fair  Judgement:  Fair  Insight:  Fair  Psychomotor Activity:  Decreased and Psychomotor Retardation  Concentration:  Fair  Recall:  Fis[employer]Fair  Fund of Knowledge:Fair  Language: Fair  Akathisia:  NA  Handed:  Right  AIMS (if indicated):     Assets:  Communication Skills Desire for Improvement Housing Leisure Time Physical Health Resilience Social Support Transportation  Sleep:  Number of Hours: 6.75   Musculoskeletal: Strength & Muscle Tone: within normal limits Gait & Station: normal Patient leans: N/A  COGNITIVE FEATURES THAT CONTRIBUTE TO RISK:  Closed-mindedness Loss of executive function Polarized thinking Thought constriction (tunnel vision)    SUICIDE RISK:   Moderate:  Frequent suicidal ideation with limited intensity, and duration, some specificity in terms of plans, no associated intent, good self-control, limited dysphoria/symptomatology, some risk factors present, and identifiable protective factors, including available and accessible social support.  PLAN OF CARE: Admit for depression and PTSD with suicidal and homicidal ideation.  I certify that inpatient services furnished can reasonably be expected to improve the patient's condition.   Richrd Kuzniar,JANARDHAHA R. 07/26/2013, 11:31 AM

## 2013-07-26 NOTE — [facility] Group Notes (Signed)
[facility]   07/26/2013 8:45 AM  Participation Quality:  Alert, Appropriate and Oriented  Mood/Affect:  Flat and Depressed  Depression Rating:  "12"  Anxiety Rating:  "12"  Thoughts of Suicide:  Pt endorses SI and HI towards father  Will you contract for safety?   Yes  Current AVH:  Pt denies  Plan for Discharge/Comments:  Pt attended discharge planning group and actively participated in group.  CSW provided pt with today's workbook.  Pt reports feeling very bad today.  Pt states that she is now homeless and doesn't have follow up or discharge plans.  CSW will assess for appropriate referrals. No further needs voiced by pt at this time.    Transportation Means: Pt denies transportation, stating her car was repossessed   Supports: No supports mentioned at this time  Reyes IvanChelsea Horton, LCSW 07/26/2013 10:14 AM

## 2013-07-26 NOTE — Clinical Social Work Note (Signed)
CSW left a voicemail message for Cathleen FearsBrenda Gregory, homeless liaison [phone]), inquiring for help with placement for pt.    Reyes IvanChelsea Horton, LCSW 07/26/2013  3:41 PM

## 2013-07-26 NOTE — Progress Notes (Signed)
D:  Pt +ve HI towards father. Pt denies SI/AVH. Pt is  and cooperative. Pt has no insight, pt blames everyone and everything for her misfortune. Pt obsessed that no one will help her in her situation. every time a possible solution to a question she asks, she already has the answer to why it will not work. Pt concerned that she takes her Celexa at night and got irritated that she was not going to take it tonight and was informed that she already had a dose earlier in the day.   A: Pt was offered support and encouragement. Pt was given scheduled medications. Pt was encourage to attend groups. Q 15 minute checks were done for safety.   R: Pt is taking medication. Pt receptive to treatment and safety maintained on unit.

## 2013-07-26 NOTE — Progress Notes (Signed)
Adult Psychoeducational Group Note  Date:  07/26/2013 Time:  10:00am Group Topic/Focus:  Primary and Secondary Emotions:   The focus of this group is to discuss the difference between primary and secondary emotions.  Participation Level:  Active  Participation Quality:  Appropriate and Attentive  Affect:  Appropriate  Cognitive:  Alert and Appropriate  Insight: Appropriate  Engagement in Group:  Engaged  Modes of Intervention:  Discussion and Education  Additional Comments:  Pt attended and participated in group. Discussion was on personal development. Pt stated personal development means learning more about self doing things properly.  Shelly BombardGarner, Hao Dion D 07/26/2013, 6:39 PM
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
## 2013-07-26 NOTE — Progress Notes (Signed)
Patient ID: Kaitlyn Luna, female   DOB: [birth year], 23 y.o.   MRN: [id number]  D: Pt. Denies SI/HI and A/V Hallucinations. Patient rates her depression at 6/10 and her hopelessness at 10/10 for the day. Patient reports mild pain in her left knee but does not request any intervention.  A: Support and encouragement provided to the patient. Scheduled medications given to patient per physician's orders.   R: Patient is receptive and cooperative but minimal. Patient is seen in the milieu and is attending some groups. Q15 minute checks are maintained for safety.

## 2013-07-26 NOTE — Tx Team (Signed)
Interdisciplinary Treatment Plan Update (Adult)  Date: 07/26/2013  Time Reviewed:  9:45 AM  Progress in Treatment: Attending groups: Yes Participating in groups:  Yes Taking medication as prescribed:  Yes Tolerating medication:  Yes Family/Significant othe contact made: CSW assessing  Patient understands diagnosis:  Yes Discussing patient identified problems/goals with staff:  Yes Medical problems stabilized or resolved:  Yes Denies suicidal/homicidal ideation: Yes Issues/concerns per patient self-inventory:  Yes Other:  New problem(s) identified: N/A  Discharge Plan or Barriers: CSW assessing for appropriate referrals.  Reason for Continuation of Hospitalization: Anxiety Depression Medication Stabilization  Comments: N/A  Estimated length of stay: 3-5 days  For review of initial/current patient goals, please see plan of care.  Attendees: Patient:    Family:     Physician:  Dr. Johnalagadda 07/26/2013 10:41 AM   Nursing:   Christa Dopson, RN 07/26/2013 10:41 AM   Clinical Social Worker:  Delicia Berens Horton, LCSW 07/26/2013 10:41 AM   Other: Conrad Withrow, PA 07/26/2013 10:41 AM   Other:  Valerie Noch, care coordination 07/26/2013 10:41 AM   Other:  Quylle Hodnett, LCSW 07/26/2013 10:41 AM   Other:  Britney Tyson, RN 07/26/2013 10:41 AM   Other:    Other:    Other:    Other:    Other:      Scribe for Treatment Team:   Horton, Soriyah Osberg Nicole, 07/26/2013 , 10:41 AM    

## 2013-07-26 NOTE — [facility] Group Notes (Signed)
[facility] LCSW Group Therapy  07/26/2013  1:15 PM   Type of Therapy:  Group Therapy  Participation Level:  Active  Participation Quality:  Attentive, Sharing and Supportive  Affect:  Depressed and Flat  Cognitive:  Alert and Oriented  Insight:  Developing/Improving and Engaged  Engagement in Therapy:  Developing/Improving and Engaged  Modes of Intervention:  Clarification, Confrontation, Discussion, Education, Exploration, Limit-setting, Orientation, Problem-solving, Rapport Building, Dance movement psychotherapisteality Testing, Socialization and Support  Summary of Progress/Problems: The topic for group today was emotional regulation.  This group focused on both positive and negative emotion identification and allowed group members to process ways to identify feelings, regulate negative emotions, and find healthy ways to manage internal/external emotions. Group members were asked to reflect on a time when their reaction to an emotion led to a negative outcome and explored how alternative responses using emotion regulation would have benefited them. Group members were also asked to discuss a time when emotion regulation was utilized when a negative emotion was experienced.  Pt discussed acting on anger, such as throwing a space heater at her father.  Pt states that she has a hard time expressing her emotions and lashes out after awhile.  Pt states that she likes to take a drive to leave the situation, when she is upset or angry.  Pt actively participated and was engaged in group discussion.    Kaitlyn IvanChelsea Horton, LCSW 07/26/2013  3:26 PM
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
## 2013-07-27 DIAGNOSIS — F332 Major depressive disorder, recurrent severe without psychotic features: Principal | ICD-10-CM

## 2013-07-27 DIAGNOSIS — F4310 Post-traumatic stress disorder, unspecified: Secondary | ICD-10-CM

## 2013-07-27 LAB — TSH: TSH: 1.848 u[IU]/mL (ref 0.350–4.500)

## 2013-07-27 MED ORDER — LOPERAMIDE HCL 2 MG PO CAPS: 2.0000 mg | ORAL_CAPSULE | Freq: Four times a day (QID) | ORAL | Status: DC | PRN

## 2013-07-27 MED ORDER — LOPERAMIDE HCL 2 MG PO CAPS
4.0000 mg | ORAL_CAPSULE | Freq: Once | ORAL | Status: AC
  Administered 2013-07-27: 4 mg via ORAL
  Filled 2013-07-27 (×2): qty 2

## 2013-07-27 MED ORDER — BIOTENE DRY MOUTH MT LIQD
15.0000 mL | OROMUCOSAL | Status: DC | PRN
  Administered 2013-07-27 – 2013-07-28 (×2): 15 mL via OROMUCOSAL
  Filled 2013-07-27: qty 15

## 2013-07-27 MED ORDER — TRAZODONE HCL 50 MG PO TABS
50.0000 mg | ORAL_TABLET | Freq: Every evening | ORAL | Status: DC | PRN
  Administered 2013-07-27 (×2): 50 mg via ORAL
  Filled 2013-07-27 (×2): qty 1
  Filled 2013-07-27 (×2): qty 6
  Filled 2013-07-27 (×2): qty 1

## 2013-07-27 NOTE — [facility] Suicide Risk Assessment (Signed)
[facility] Family/Significant Other Suicide Prevention Education  Suicide Prevention Education:   Patient Refusal for Family/Significant Other Suicide Prevention Education: The patient has refused to provide written consent for family/significant other to be provided Family/Significant Other Suicide Prevention Education during admission and/or prior to discharge.  Physician notified.  CSW provided suicide prevention information with patient.    The suicide prevention education provided includes the following:  Suicide risk factors  Suicide prevention and interventions  National Suicide Hotline telephone number  [facility] assessment telephone number  [facility] Emergency Assistance 911  [facility] and/or Residential Mobile Crisis Unit telephone number   Reyes IvanChelsea Horton, [place]CS[place] 07/27/2013 10:17 AM

## 2013-07-27 NOTE — Progress Notes (Signed)
Patient ID: Kaitlyn Luna, female   DOB: 03/13/1991, 23 y.o.   MRN: [id number]  D: Pt. Denies SI/HI and A/V Hallucinations. Patient does not report any pain or discomfort at this time. Patient reports having a better day today than yesterday. Patient's affect brightens when speaking about her brother visiting. Patient is rating her depression and hopelessness at 3/10 for the day. Patient reports her appetite is improving and so is her ability to pay attention.  A: Support and encouragement provided to the patient to go to groups and speak to writer about any questions or concerns. Scheduled medications are given to patient per physician's orders.   R: Patient is receptive and cooperative. Patient is seen in the milieu and is attending some groups. Q15 minute checks are maintained for safety.
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
## 2013-07-27 NOTE — Progress Notes (Signed)
D: Pt denies SI/HI/AVH. Pt is  cooperative. Pt continues to be sad and very negative about everything mentioned. Pt does interact with other pts in the mileu.   A: Pt was offered support and encouragement. Pt was given scheduled medications. Pt was encourage to attend groups. Q 15 minute checks were done for safety.   R:Pt attends groups and interacts well with peers and staff. Pt is taking medication.Pt receptive to treatment and safety maintained on unit.

## 2013-07-27 NOTE — Progress Notes (Signed)
Recreation Therapy Notes  Animal-Assisted Activity/Therapy (AAA/T) Program Checklist/Progress Notes Patient Eligibility Criteria Checklist & Daily Group note for Rec Tx Intervention  Date: 03.19.2015 Time: 2:45pm Location: [address] Dayroom   AAA/T Program Assumption of Risk Form signed by Patient/ or Parent Legal Guardian yes  Patient is free of allergies or sever asthma yes  Patient reports no fear of animals yes  Patient reports no history of cruelty to animals yes   Patient understands his/her participation is voluntary yes  Patient washes hands before animal contact yes  Patient washes hands after animal contact yes  Behavioral Response: Appropriate   Education: Hand Washing, Appropriate Animal Interaction   Education Outcome: Acknowledges understanding   Clinical Observations/Feedback: Patient pet therapy dog appropriately and interacted with peers appropriately during session.   Driana Dazey L Murle Otting, LRT/CTRS  [employer] 07/27/2013 4:31 PM 

## 2013-07-27 NOTE — Progress Notes (Signed)
Patient ID: Kaitlyn Luna, female   DOB: 26-Sep-1990, 23 y.o.   MRN: [id number]  Morning Wellness Group 9:30 AM  The focus of this group is to educate the patient on the purpose and policies of crisis stabilization and provide a format to answer questions about their admission.  The group details unit policies and expectations of patients while admitted.  Patient did not attend group.

## 2013-07-27 NOTE — Progress Notes (Signed)
Patient on the hallway during this assessment. She appeared anxious and very excited. Patient stated that her brother visited and notified her that she's got a new job with a [employer]. She requested for 72 hour form and said she must be discharged on Monday so she could start the new job. Writer encouraged and supported patient. Gave patient 72hour release form to complete, and witnessed form with patient. Patient receptive to encouragement and support. Q 15 minute check continues as ordered to maintain safety.

## 2013-07-27 NOTE — [facility] Group Notes (Signed)
[facility] LCSW Group Therapy  07/27/2013  1:15 PM   Type of Therapy:  Group Therapy  Participation Level:  Active  Participation Quality:  Attentive, Sharing and Supportive  Affect:  Depressed and Flat  Cognitive:  Alert and Oriented  Insight:  Developing/Improving and Engaged  Engagement in Therapy:  Developing/Improving and Engaged  Modes of Intervention:  Clarification, Confrontation, Discussion, Education, Exploration, Limit-setting, Orientation, Problem-solving, Rapport Building, Dance movement psychotherapisteality Testing, Socialization and Support  Summary of Progress/Problems: The topic for group was balance in life.  Today's group focused on defining balance in one's own words, identifying things that can knock one off balance, and exploring healthy ways to maintain balance in life. Group members were asked to provide an example of a time when they felt off balance, describe how they handled that situation,and process healthier ways to regain balance in the future. Group members were asked to share the most important tool for maintaining balance that they learned [facility] and how they plan to apply this method after discharge.  Pt states that her life became unbalanced when she lost her job. Pt explained that because of losing her job, she lost her support group, her relationship, her car and had to move to her father's house, where she has had conflict.  Pt states that she feels her life has spiraled out of control since the loss of her job.  Pt discussed starting over and getting back on her feet, in order to get balance back in her life.  Pt actively participated and was engaged in group discussion.     Nilson Tabora Horton, LCSW 07/27/2013  3:00 PM

## 2013-07-28 ENCOUNTER — Encounter (HOSPITAL_COMMUNITY): Payer: Self-pay | Admitting: Emergency Medicine

## 2013-07-28 DIAGNOSIS — Z3202 Encounter for pregnancy test, result negative: Secondary | ICD-10-CM | POA: Insufficient documentation

## 2013-07-28 DIAGNOSIS — Z79899 Other long term (current) drug therapy: Secondary | ICD-10-CM | POA: Insufficient documentation

## 2013-07-28 DIAGNOSIS — N289 Disorder of kidney and ureter, unspecified: Secondary | ICD-10-CM | POA: Insufficient documentation

## 2013-07-28 DIAGNOSIS — F3289 Other specified depressive episodes: Secondary | ICD-10-CM | POA: Insufficient documentation

## 2013-07-28 DIAGNOSIS — Z792 Long term (current) use of antibiotics: Secondary | ICD-10-CM | POA: Insufficient documentation

## 2013-07-28 DIAGNOSIS — F329 Major depressive disorder, single episode, unspecified: Secondary | ICD-10-CM | POA: Insufficient documentation

## 2013-07-28 DIAGNOSIS — N83209 Unspecified ovarian cyst, unspecified side: Secondary | ICD-10-CM | POA: Insufficient documentation

## 2013-07-28 DIAGNOSIS — N390 Urinary tract infection, site not specified: Secondary | ICD-10-CM | POA: Insufficient documentation

## 2013-07-28 DIAGNOSIS — F411 Generalized anxiety disorder: Secondary | ICD-10-CM | POA: Insufficient documentation

## 2013-07-28 LAB — CBC WITH DIFFERENTIAL/PLATELET
BASOS ABS: 0 10*3/uL (ref 0.0–0.1)
Basophils Relative: 0 % (ref 0–1)
Eosinophils Absolute: 0.4 10*3/uL (ref 0.0–0.7)
Eosinophils Relative: 3 % (ref 0–5)
HEMATOCRIT: 42.5 % (ref 36.0–46.0)
Hemoglobin: 14.6 g/dL (ref 12.0–15.0)
LYMPHS PCT: 9 % — AB (ref 12–46)
Lymphs Abs: 1.2 10*3/uL (ref 0.7–4.0)
MCH: 30.8 pg (ref 26.0–34.0)
MCHC: 34.4 g/dL (ref 30.0–36.0)
MCV: 89.7 fL (ref 78.0–100.0)
MONO ABS: 0.8 10*3/uL (ref 0.1–1.0)
MONOS PCT: 6 % (ref 3–12)
Neutro Abs: 11.3 10*3/uL — ABNORMAL HIGH (ref 1.7–7.7)
Neutrophils Relative %: 82 % — ABNORMAL HIGH (ref 43–77)
Platelets: 255 10*3/uL (ref 150–400)
RBC: 4.74 MIL/uL (ref 3.87–5.11)
RDW: 12.6 % (ref 11.5–15.5)
WBC: 13.7 10*3/uL — AB (ref 4.0–10.5)

## 2013-07-28 LAB — COMPREHENSIVE METABOLIC PANEL
ALT: 20 U/L (ref 0–35)
AST: 18 U/L (ref 0–37)
Albumin: 4.3 g/dL (ref 3.5–5.2)
Alkaline Phosphatase: 93 U/L (ref 39–117)
BUN: 10 mg/dL (ref 6–23)
CALCIUM: 9.6 mg/dL (ref 8.4–10.5)
CO2: 22 meq/L (ref 19–32)
CREATININE: 0.65 mg/dL (ref 0.50–1.10)
Chloride: 101 mEq/L (ref 96–112)
GFR calc Af Amer: >90 mL/min (ref >90)
GFR calc non Af Amer: >90 mL/min (ref >90)
Glucose, Bld: 95 mg/dL (ref 70–99)
Potassium: 4 mEq/L (ref 3.7–5.3)
Sodium: 139 mEq/L (ref 137–147)
Total Bilirubin: 0.5 mg/dL (ref 0.3–1.2)
Total Protein: 7.9 g/dL (ref 6.0–8.3)

## 2013-07-28 LAB — URINALYSIS, ROUTINE W REFLEX MICROSCOPIC
Glucose, UA: NEGATIVE mg/dL
KETONES UR: 15 mg/dL — AB
Nitrite: POSITIVE — AB
PROTEIN: 100 mg/dL — AB
Specific Gravity, Urine: 1.027 (ref 1.005–1.030)
UROBILINOGEN UA: 1 mg/dL (ref 0.0–1.0)
pH: 6 (ref 5.0–8.0)

## 2013-07-28 LAB — URINE MICROSCOPIC-ADD ON

## 2013-07-28 LAB — POC URINE PREG, ED: PREG TEST UR: NEGATIVE

## 2013-07-28 MED ORDER — CIPROFLOXACIN HCL 500 MG PO TABS: 500.0000 mg | ORAL_TABLET | Freq: Two times a day (BID) | ORAL | Status: DC

## 2013-07-28 MED ORDER — TRAZODONE HCL 50 MG PO TABS: 50.0000 mg | ORAL_TABLET | Freq: Every evening | ORAL | Status: DC | PRN

## 2013-07-28 MED ORDER — CITALOPRAM HYDROBROMIDE 20 MG PO TABS: 20.0000 mg | ORAL_TABLET | Freq: Every day | ORAL | Status: DC
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
## 2013-07-28 NOTE — Progress Notes (Signed)
Adult Psychoeducational Group Note  Date:  07/28/2013 Time:  10:00AM  Group Topic/Focus:  Early Warning Signs:   The focus of this group is to help patients identify signs or symptoms they exhibit before slipping into an unhealthy state or crisis.  Participation Level:  Active  Participation Quality:  Appropriate and Attentive  Affect:  Appropriate  Cognitive:  Appropriate  Insight: Appropriate  Engagement in Group:  Engaged and Supportive  Modes of Intervention:  Discussion, Socialization and Support  Additional Comments:  Pts identified early warning signs that they notice about themselves when they begin to start spiraling down towards relapse. Pts identified which warning sign is most relevant to them and how they can cope or find other ways to deal with stress and triggers before they relapse. Pt identified being sad and crying and her warning sign. Pt stated she needs to stay active to help deal with this.  Caswell CorwinOwen, Azlyn Wingler C 07/28/2013, 2:14 PM

## 2013-07-28 NOTE — ED Notes (Signed)
C/o bilateral flank pain, nausea, and vomiting since 5pm.  States she was seen on Sunday and [facility]  Reports she is taking antibiotic because WBC was elevated.

## 2013-07-28 NOTE — [facility] Suicide Risk Assessment (Signed)
   Demographic Factors:  Adolescent or young adult, Caucasian and Low socioeconomic status  Total Time spent with patient: 30 minutes  Psychiatric Specialty Exam: Physical Exam  ROS  Blood pressure 116/83, pulse 99, temperature 98.1 F (36.7 C), temperature source Oral, resp. rate 18, height 5\' 6"  (1.676 m), weight 132.904 kg (293 lb), SpO2 98.00%.Body mass index is 47.31 kg/(m^2).  General Appearance: Casual and Fairly Groomed  Eye Contact::  Good  Speech:  Clear and Coherent  Volume:  Normal  Mood:  Euthymic  Affect:  Appropriate and Congruent  Thought Process:  Coherent and Goal Directed  Orientation:  Full (Time, Place, and Person)  Thought Content:  WDL  Suicidal Thoughts:  No  Homicidal Thoughts:  No  Memory:  NA  Judgement:  Good  Insight:  Good  Psychomotor Activity:  Normal  Concentration:  Good  Recall:  Good  Fund of Knowledge:Good  Language: Good  Akathisia:  NA  Handed:  Right  AIMS (if indicated):     Assets:  Communication Skills Desire for Improvement Financial Resources/Insurance Housing Intimacy Physical Health Resilience Social Support Talents/Skills Transportation  Sleep:  Number of Hours: 6.75    Musculoskeletal: Strength & Muscle Tone: within normal limits Gait & Station: normal Patient leans: N/A   Mental Status Per Nursing Assessment::   On Admission:     Current Mental Status by Physician: NA  Loss Factors: Financial problems/change in socioeconomic status  Historical Factors: NA  Risk Reduction Factors:   Sense of responsibility to family, Religious beliefs about death, Employed, Living with another person, especially a relative, Positive social support, Positive therapeutic relationship and Positive coping skills or problem solving skills  Continued Clinical Symptoms:  Depression:   Recent sense of peace/wellbeing Unstable or Poor Therapeutic Relationship Previous Psychiatric Diagnoses and Treatments  Cognitive  Features That Contribute To Risk:  Polarized thinking    Suicide Risk:  Minimal: No identifiable suicidal ideation.  Patients presenting with no risk factors but with morbid ruminations; may be classified as minimal risk based on the severity of the depressive symptoms  Discharge Diagnoses:   AXIS I:  Major Depression, Recurrent severe and Post Traumatic Stress Disorder AXIS II:  Deferred AXIS III:   Past Medical History  Diagnosis Date  . Anxiety   . Depression   . PTSD (post-traumatic stress disorder)    AXIS IV:  other psychosocial or environmental problems, problems related to social environment and problems with primary support group AXIS V:  61-70 mild symptoms  Plan Of Care/Follow-up recommendations:  Activity:  As tolerated Diet:  Regular  Is patient on multiple antipsychotic therapies at discharge:  No   Has Patient had three or more failed trials of antipsychotic monotherapy by history:  No  Recommended Plan for Multiple Antipsychotic Therapies: NA    Kaitlyn Luna,JANARDHAHA R. 07/28/2013, 11:48 AM

## 2013-07-28 NOTE — [facility] Group Notes (Signed)
[facility] L[facility] Discharge Planning Group Note   07/28/2013 10:50 AM    Participation Quality:  Appropraite  Mood/Affect:  Appropriate  Depression Rating:    Anxiety Rating:    Thoughts of Suicide:  No  Will you contract for safety?   NA  Current AVH:  No  Plan for Discharge/Comments:  Patient attended discharge planning group and actively participated in group.  Patient advised of wanting to discharge to the shelter as [place].  She will need assistance with medications.  [facility].   CSW provided all participants with daily workbook.   Transportation Means: Patient to be assisted with transportation.   Supports:  Patient does not have support system.   Kaitlyn Luna, Kaitlyn Luna

## 2013-07-28 NOTE — Tx Team (Signed)
Interdisciplinary Treatment Plan Update   Date Reviewed:  07/28/2013  Time Reviewed:  9:54 AM  Progress in Treatment:   Attending groups: Yes Participating in groups: Yes Taking medication as prescribed: Yes  Tolerating medication: Yes Family/Significant other contact made:  No, patient declined collateral contact Patient understands diagnosis: Yes  Discussing patient identified problems/goals with staff: Yes Medical problems stabilized or resolved: Yes Denies suicidal/homicidal ideation: Yes Patient has not harmed self or others: Yes  For review of initial/current patient goals, please see plan of care.  Estimated Length of Stay:  Discharge Today  Reasons for Continued Hospitalization:   New Problems/Goals identified:    Discharge Plan or Barriers:   Home with [facility]   Additional Comments:   Attendees:  Patient:  07/28/2013 9:54 AM   Signature: Mervyn GayJ. Jonnalagadda, MD 07/28/2013 9:54 AM  Signature:   07/28/2013 9:54 AM  Signature:   07/28/2013 9:54 AM  Signature:Beverly Terrilee CroakKnight, RN 07/28/2013 9:54 AM  Signature:   07/28/2013 9:54 AM  Signature:  Juline PatchQuylle Malisha Mabey, LCSW 07/28/2013 9:54 AM  Signature:  Mordecai RasmussenHannah Coble, LCSW - Lead Social Worker 07/28/2013 9:54 AM  Signature:  Leisa LenzValerie Enoch, Care Coordinator [facility] 07/28/2013 9:54 AM  Signature:   07/28/2013 9:54 AM  Signature: Leighton ParodyBritney Tyson, RN 07/28/2013  9:54 AM  Signature:   Onnie BoerJennifer Clark, RN [facility] 07/28/2013  9:54 AM  Signature:  Harold Barbanonecia Byrd, RN 07/28/2013  9:54 AM    Scribe for Treatment Team:   Juline PatchQuylle Davius Goudeau,  07/28/2013 9:54 AM

## 2013-07-28 NOTE — Progress Notes (Signed)
[facility] Discharge Plan :  Will you be returning to the same living situation after discharge: No.  Patient [facility] At discharge, do you have transportation home?:No.  Patient assisted with bus passes. Do you have the ability to pay for your medications:No.  No, patient will be assisted with indigent medications. Patient reports she has no way of paying for medications.  Release of information consent forms completed and in the chart;  Patient's signature needed at discharge.  Patient to Follow [facility] at:  [facility]   [phone]  Please go to [facility]'s walk in clinic on Monday, July 31, 2013 or any weekday between 8AM - 3PM for medication management and counseling.     [facility] is located at [address]     [place], [place]  [id number]    Patient denies SI/HI:  Patient no longer endorsing SI/HI or other thoughts of self harm.   Safety Planning and Suicide Prevention discussed: .Reviewed with all patients during discharge planning group   Abie Cheek, Joesph JulyQuylle Hairston 07/28/2013, 10:45 AM

## 2013-07-29 ENCOUNTER — Emergency Department (HOSPITAL_COMMUNITY): Payer: Medicaid Other

## 2013-07-29 ENCOUNTER — Emergency Department (HOSPITAL_COMMUNITY)
Admission: EM | Admit: 2013-07-29 | Discharge: 2013-07-29 | Disposition: A | Discharge status: Home / Self Care | Payer: Medicaid Other | Attending: Emergency Medicine | Admitting: Emergency Medicine

## 2013-07-29 ENCOUNTER — Encounter (HOSPITAL_COMMUNITY): Payer: Self-pay | Admitting: *Deleted

## 2013-07-29 DIAGNOSIS — N83209 Unspecified ovarian cyst, unspecified side: Secondary | ICD-10-CM

## 2013-07-29 DIAGNOSIS — Q6210 Congenital occlusion of ureter, unspecified: Secondary | ICD-10-CM

## 2013-07-29 DIAGNOSIS — N390 Urinary tract infection, site not specified: Secondary | ICD-10-CM

## 2013-07-29 MED ORDER — DEXTROSE 5 % IV SOLN
1.0000 g | Freq: Once | INTRAVENOUS | Status: AC
  Administered 2013-07-29: 1 g via INTRAVENOUS
  Filled 2013-07-29: qty 10

## 2013-07-29 MED ORDER — TRAMADOL HCL 50 MG PO TABS: 50.0000 mg | ORAL_TABLET | Freq: Four times a day (QID) | ORAL | Status: DC | PRN

## 2013-07-29 MED ORDER — CEFTRIAXONE SODIUM 1 G IJ SOLR: 1.0000 g | Freq: Once | INTRAMUSCULAR | Status: DC

## 2013-07-29 MED ORDER — CEPHALEXIN 500 MG PO CAPS: 500.0000 mg | ORAL_CAPSULE | Freq: Four times a day (QID) | ORAL | Status: DC

## 2013-07-29 MED ORDER — ONDANSETRON HCL 4 MG/2ML IJ SOLN
4.0000 mg | Freq: Once | INTRAMUSCULAR | Status: AC
  Administered 2013-07-29: 4 mg via INTRAVENOUS
  Filled 2013-07-29: qty 2

## 2013-07-29 MED ORDER — ONDANSETRON HCL 4 MG PO TABS: 4.0000 mg | ORAL_TABLET | Freq: Four times a day (QID) | ORAL | Status: DC

## 2013-07-29 MED ORDER — SODIUM CHLORIDE 0.9 % IV BOLUS (SEPSIS)
1000.0000 mL | Freq: Once | INTRAVENOUS | Status: AC
  Administered 2013-07-29: 1000 mL via INTRAVENOUS

## 2013-07-29 MED ORDER — FENTANYL CITRATE 0.05 MG/ML IJ SOLN
50.0000 ug | INTRAMUSCULAR | Status: DC | PRN
  Administered 2013-07-29: 50 ug via INTRAVENOUS
  Filled 2013-07-29: qty 2

## 2013-07-29 NOTE — ED Provider Notes (Signed)
CSN: [id number]     Arrival date & time 07/28/13  2038 History   First MD Initiated Contact with Patient 07/29/13 0013     Chief Complaint  Patient presents with  . Flank Pain     (Consider location/radiation/quality/duration/timing/severity/associated sxs/prior Treatment) HPI History provided by patient. Today was discharged from behavioral health unit to a homeless shelter. Patient states she has not had her Cipro today - this was prescribed for presumed urinary tract infection.  She states that tonight she developed left greater than right flank pain over her "kidneys". She is worried that she has a kidney infection. She complains of chills. She has nausea but no vomiting. Symptoms moderate in severity. No dysuria or hematuria.  Past Medical History  Diagnosis Date  . Anxiety   . Depression   . PTSD (post-traumatic stress disorder)    Past Surgical History  Procedure Laterality Date  . Breast surgery     No family history on file. History  Substance Use Topics  . Smoking status: Never Smoker   . Smokeless tobacco: Not on file  . Alcohol Use: No   OB History   Grav Para Term Preterm Abortions TAB SAB Ect Mult Living                 Review of Systems  Constitutional: Negative for fever and chills.  Respiratory: Negative for shortness of breath.   Cardiovascular: Negative for chest pain.  Gastrointestinal: Positive for nausea. Negative for abdominal pain and blood in stool.  Genitourinary: Positive for flank pain.  Musculoskeletal: Negative for back pain, neck pain and neck stiffness.  Skin: Negative for rash.  Neurological: Negative for headaches.  All other systems reviewed and are negative.      Allergies  Review of patient's allergies indicates no known allergies.  Home Medications   Current Outpatient Rx  Name  Route  Sig  Dispense  Refill  . ciprofloxacin (CIPRO) 500 MG tablet   Oral   Take 1 tablet (500 mg total) by mouth 2 (two) times daily. For  infection.         . citalopram (CELEXA) 20 MG tablet   Oral   Take 1 tablet (20 mg total) by mouth daily. For depression.   30 tablet   0   . traZODone (DESYREL) 50 MG tablet   Oral   Take 1 tablet (50 mg total) by mouth at bedtime and may repeat dose one time if needed. For insomnia.   30 tablet   0    BP 117/51  Pulse 101  Temp(Src) 99.5 F (37.5 C) (Oral)  Resp 20  SpO2 99%  LMP 07/26/2013 Physical Exam  Constitutional: She is oriented to person, place, and time. She appears well-developed and well-nourished.  HENT:  Head: Normocephalic and atraumatic.  Eyes: EOM are normal. Pupils are equal, round, and reactive to light.  Neck: Neck supple.  Cardiovascular: Regular rhythm and intact distal pulses.   Pulmonary/Chest: Effort normal. No respiratory distress.  Abdominal: Soft. Bowel sounds are normal. She exhibits no distension. There is no tenderness. There is no rebound and no guarding.  Bilateral CVA tenderness, patient jumps with even light palpation  Musculoskeletal: Normal range of motion. She exhibits no edema.  Neurological: She is alert and oriented to person, place, and time.  Skin: Skin is warm and dry.    ED Course  Procedures (including critical care time) Labs Review Labs Reviewed  CBC WITH DIFFERENTIAL - Abnormal; Notable for the following:  WBC 13.7 (*)    Neutrophils Relative % 82 (*)    Neutro Abs 11.3 (*)    Lymphocytes Relative 9 (*)    All other components within normal limits  URINALYSIS, ROUTINE W REFLEX MICROSCOPIC - Abnormal; Notable for the following:    Color, Urine RED (*)    APPearance TURBID (*)    Hgb urine dipstick LARGE (*)    Bilirubin Urine MODERATE (*)    Ketones, ur 15 (*)    Protein, ur 100 (*)    Nitrite POSITIVE (*)    Leukocytes, UA MODERATE (*)    All other components within normal limits  URINE MICROSCOPIC-ADD ON - Abnormal; Notable for the following:    Squamous Epithelial / LPF FEW (*)    Bacteria, UA FEW (*)     All other components within normal limits  COMPREHENSIVE METABOLIC PANEL  POC URINE PREG, ED   Imaging Review Ct Abdomen Pelvis Wo Contrast  07/29/2013   CLINICAL DATA:  Bilateral flank pain. Nausea and vomiting. Acute onset approximately 7 hr ago.  EXAM: CT ABDOMEN AND PELVIS WITHOUT CONTRAST  TECHNIQUE: Multidetector CT imaging of the abdomen and pelvis was performed following the standard protocol without intravenous contrast.  COMPARISON:  None.  FINDINGS: No evidence of urinary tract calculi or obstruction on either side. 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 Mild hydronephrosis involving the lower pole collecting system of the left kidney, though the ureter is diminutive. Within the limits of the unenhanced technique, no focal parenchymal abnormality involving either kidney.  Normal unenhanced appearance of the liver, spleen, pancreas, adrenal glands, and gallbladder. No biliary ductal dilation. No visible atherosclerosis. Numerous upper normal size to slightly enlarged lymph nodes throughout the mesentery. Mild enlargement of retroperitoneal lymph nodes, the largest node approximating 3.4 x 1.3 cm (coronal image 47).  Stomach decompressed and unremarkable. Normal-appearing small bowel. Moderate stool burden throughout normal appearing colon. Normal gas-filled appendix in the right mid pelvis. No ascites.  Normal-appearing uterus and left ovary. Approximate 3.0 x 3.3 x 3.7 cm cyst involving the right ovary. No free pelvic fluid to suggest cyst rupture. Urinary bladder decompressed and unremarkable.  Bone window images unremarkable. Visualized lung bases clear. Heart size normal.  IMPRESSION: 1. No evidence of urinary tract calculi or obstruction on either side. 2. 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 3. 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 4. Shotty lymphadenopathy involving the mesenteries and the retroperitoneum. 5. 3-4 cm cyst involving the right ovary. S this appears to be a simple cyst, no further imaging follow-up is felt necessary. This recommendation follows ACR consensus guidelines: White Paper of the ACR Incidental Findings Committee II on Adnexal Findings. J Am Coll Radiol [phone].   Electronically Signed   By: Hulan Saas M.D.   On: 07/29/2013 02:54   IV fluids. IV fentanyl pain control. IV Zofran for nausea.  On recheck pain improved, no emesis in the ER. She tolerates oral fluids.   Plan discharge home with prescription for Keflex. Plan stop Cipro. Urine culture pending. Ultram as needed. Term precautions provided and verbalized as understood. Outpatient referral provided.  MDM   Diagnosis: UTI, incidental finding UPJ stenosis, incidental finding right ovarian cyst  Evaluated with labs, urinalysis and imaging reviewed as above Improved with medications provided Vital signs and nursing  notes reviewed and considered.  Kaitlyn NielsenBrian Amaree Leeper, MD 07/29/13 [phone]

## 2013-07-29 NOTE — ED Notes (Signed)
Gave Pt Ginger ale

## 2013-07-29 NOTE — Discharge Instructions (Signed)
Urinary Tract Infection  Urinary tract infections (UTIs) can develop anywhere along your urinary tract. Your urinary tract is your body's drainage system for removing wastes and extra water. Your urinary tract includes two kidneys, two ureters, a bladder, and a urethra. Your kidneys are a pair of bean-shaped organs. Each kidney is about the size of your fist. They are located below your ribs, one on each side of your spine.  CAUSES  Infections are caused by microbes, which are microscopic organisms, including fungi, viruses, and bacteria. These organisms are so small that they can only be seen through a microscope. Bacteria are the microbes that most commonly cause UTIs.  SYMPTOMS   Symptoms of UTIs may vary by age and gender of the patient and by the location of the infection. Symptoms in young women typically include a frequent and intense urge to urinate and a painful, burning feeling in the bladder or urethra during urination. Older women and men are more likely to be tired, shaky, and weak and have muscle aches and abdominal pain. A fever may mean the infection is in your kidneys. Other symptoms of a kidney infection include pain in your back or sides below the ribs, nausea, and vomiting.  DIAGNOSIS  To diagnose a UTI, your caregiver will ask you about your symptoms. Your caregiver also will ask to provide a urine sample. The urine sample will be tested for bacteria and white blood cells. White blood cells are made by your body to help fight infection.  TREATMENT   Typically, UTIs can be treated with medication. Because most UTIs are caused by a bacterial infection, they usually can be treated with the use of antibiotics. The choice of antibiotic and length of treatment depend on your symptoms and the type of bacteria causing your infection.  HOME CARE INSTRUCTIONS   If you were prescribed antibiotics, take them exactly as your caregiver instructs you. Finish the medication even if you feel better after you  have only taken some of the medication.   Drink enough water and fluids to keep your urine clear or pale yellow.   Avoid caffeine, tea, and carbonated beverages. They tend to irritate your bladder.   Empty your bladder often. Avoid holding urine for long periods of time.   Empty your bladder before and after sexual intercourse.   After a bowel movement, women should cleanse from front to back. Use each tissue only once.  SEEK MEDICAL CARE IF:    You have back pain.   You develop a fever.   Your symptoms do not begin to resolve within 3 days.  SEEK IMMEDIATE MEDICAL CARE IF:    You have severe back pain or lower abdominal pain.   You develop chills.   You have nausea or vomiting.   You have continued burning or discomfort with urination.  MAKE SURE YOU:    Understand these instructions.   Will watch your condition.   Will get help right away if you are not doing well or get worse.  Document Released: 02/04/2005 Document Revised: 10/27/2011 Document Reviewed: 06/05/2011  ExitCare Patient Information 2014 [employer].

## 2013-07-30 LAB — URINE CULTURE

## 2013-08-02 NOTE — Progress Notes (Signed)
Patient Discharge Instructions:  After Visit Summary (AVS):   Faxed to:  08/02/13 Psychiatric Admission Assessment Note:   Faxed to:  08/02/13 Suicide Risk Assessment - Discharge Assessment:   Faxed to:  08/02/13 Faxed/Sent to the Next Level Care provider:  08/02/13 [facility]axed to [facility] @ [phone]  Jerelene ReddenSheena E [place], 08/02/2013, 3:38 PM

## 2013-08-24 ENCOUNTER — Other Ambulatory Visit: Payer: Self-pay | Admitting: Obstetrics and Gynecology
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
## 2013-12-27 ENCOUNTER — Encounter (HOSPITAL_COMMUNITY): Payer: Self-pay | Admitting: Emergency Medicine

## 2013-12-27 ENCOUNTER — Emergency Department (INDEPENDENT_AMBULATORY_CARE_PROVIDER_SITE_OTHER)
Admission: EM | Admit: 2013-12-27 | Discharge: 2013-12-27 | Disposition: A | Discharge status: Home / Self Care | Payer: Medicaid Other | Source: Home / Self Care | Attending: Emergency Medicine | Admitting: Emergency Medicine

## 2013-12-27 DIAGNOSIS — S99929A Unspecified injury of unspecified foot, initial encounter: Secondary | ICD-10-CM

## 2013-12-27 DIAGNOSIS — S8992XA Unspecified injury of left lower leg, initial encounter: Secondary | ICD-10-CM

## 2013-12-27 DIAGNOSIS — S8990XA Unspecified injury of unspecified lower leg, initial encounter: Secondary | ICD-10-CM

## 2013-12-27 DIAGNOSIS — S99919A Unspecified injury of unspecified ankle, initial encounter: Secondary | ICD-10-CM

## 2013-12-27 MED ORDER — HYDROCODONE-ACETAMINOPHEN 5-325 MG PO TABS: 1.0000 | ORAL_TABLET | Freq: Four times a day (QID) | ORAL | Status: DC | PRN

## 2013-12-27 NOTE — Discharge Instructions (Signed)

## 2013-12-27 NOTE — ED Notes (Signed)
Left knee pain one week.  Reports a fall 2 weeks ago and a fall today, twisting of knee.  History of knee surgery involving this knee.  Patient reports pain with movement and weightbearing.  Patient stepped out of a truck into a gulley area, ground softer and gulley deeper than thought.

## 2013-12-27 NOTE — ED Provider Notes (Signed)
CSN: [id number]     Arrival date & time 12/27/13  1823 History   First MD Initiated Contact with Patient 12/27/13 1931     Chief Complaint  Patient presents with  . Knee Pain   (Consider location/radiation/quality/duration/timing/severity/associated sxs/prior Treatment) HPI Comments: 23 year old female with history of left medial meniscal tear requiring arthroscopic partial meniscectomy presents for evaluation of left knee pain. She has had continued pain since her surgery one year ago but it has never been that bad, more like a nagging pain 2 weeks ago, she twisted her knee and reinjured it she believes, this led to moderately increased pain. Today she was getting out of the car when she stepped into a hole and twisted her knee again, now she is having difficulty walking on it. She tried to call her orthopedic surgeon, Dr. Eulah Pont, but she was told that she needs a new referral from her primary care provider so she came here. She did not know that a primary care provider is her normal doctor. No numbness in the leg. No swelling distal to the knee. No effusion or swelling in the knee itself, only pain. She is able to walk without a limp but she says it hurts. Over-the-counter medications are not helping significantly with the pain.   Past Medical History  Diagnosis Date  . Anxiety   . Depression   . PTSD (post-traumatic stress disorder)    Past Surgical History  Procedure Laterality Date  . Breast surgery     No family history on file. History  Substance Use Topics  . Smoking status: Never Smoker   . Smokeless tobacco: Not on file  . Alcohol Use: No   OB History   Grav Para Term Preterm Abortions TAB SAB Ect Mult Living                 Review of Systems  Musculoskeletal: Positive for arthralgias (see history of present illness).  All other systems reviewed and are negative.   Allergies  Review of patient's allergies indicates no known allergies.  Home Medications   Prior to  Admission medications   Medication Sig Start Date End Date Taking? Authorizing Provider  cephALEXin (KEFLEX) 500 MG capsule Take 1 capsule (500 mg total) by mouth 4 (four) times daily. 07/29/13   Sunnie Nielsen, MD  ciprofloxacin (CIPRO) 500 MG tablet Take 1 tablet (500 mg total) by mouth 2 (two) times daily. For infection. 07/28/13   Verne Spurr, PA-C  citalopram (CELEXA) 20 MG tablet Take 1 tablet (20 mg total) by mouth daily. For depression. 07/28/13   Verne Spurr, PA-C  HYDROcodone-acetaminophen (NORCO) 5-325 MG per tablet Take 1 tablet by mouth every 6 (six) hours as needed for moderate pain. 12/27/13   Adrian Blackwater Yitzchak Kothari, PA-C  ondansetron (ZOFRAN) 4 MG tablet Take 1 tablet (4 mg total) by mouth every 6 (six) hours. 07/29/13   Sunnie Nielsen, MD  traMADol (ULTRAM) 50 MG tablet Take 1 tablet (50 mg total) by mouth every 6 (six) hours as needed. 07/29/13   Sunnie Nielsen, MD  traZODone (DESYREL) 50 MG tablet Take 1 tablet (50 mg total) by mouth at bedtime and may repeat dose one time if needed. For insomnia. 07/28/13   Verne Spurr, PA-C   BP 112/69  Pulse 73  Temp(Src) 98.6 F (37 C) (Oral)  Resp 20  SpO2 97%  LMP 12/27/2013 Physical Exam  Nursing note and vitals reviewed. Constitutional: She is oriented to person, place, and time. Vital signs are normal.  She appears well-developed and well-nourished. No distress.  Obese body habitus  HENT:  Head: Normocephalic and atraumatic.  Pulmonary/Chest: Effort normal. No respiratory distress.  Musculoskeletal:       Left knee: She exhibits decreased range of motion. She exhibits no swelling, no effusion, no deformity, no erythema, normal alignment, no LCL laxity, normal patellar mobility, no bony tenderness and no MCL laxity. Abnormal meniscus: difficult to assessdue to the patient's level of pain currently. Tenderness found. Medial joint line and MCL tenderness noted. No lateral joint line, no LCL and no patellar tendon tenderness noted.  Neurological: She  is alert and oriented to person, place, and time. She has normal strength. Coordination normal.  Skin: Skin is warm and dry. No rash noted. She is not diaphoretic.  Psychiatric: She has a normal mood and affect. Judgment normal.    ED Course  Procedures (including critical care time) Labs Review Labs Reviewed - No data to display  Imaging Review No results found.   MDM   1. Knee injury, left, initial encounter    She has been instructed to followup with her primary care provider for orthopedic referral, she has Medicaid so this will be her only recourse. We'll provide pain medication to use as needed until then.  Also advised ice and elevation   Meds ordered this encounter  Medications  . HYDROcodone-acetaminophen (NORCO) 5-325 MG per tablet    Sig: Take 1 tablet by mouth every 6 (six) hours as needed for moderate pain.    Dispense:  30 tablet    Refill:  0    Order Specific Question:  Supervising Provider    Answer:  Lorenz CoasterKELLER, DAVID C [[id number]]     Graylon GoodZachary H Chloe Miyoshi, PA-C 12/28/13 1214

## 2013-12-27 NOTE — ED Notes (Signed)
When asked if a workers comp case, responded no, not workers comp

## 2013-12-29 NOTE — ED Provider Notes (Signed)
Medical screening examination/treatment/procedure(s) were performed by resident physician or non-physician practitioner and as supervising physician I was immediately available for consultation/collaboration.  Randal BubaErin Alexie Lanni, MD     Charm RingsErin J Trever Streater, MD 12/29/13 [phone]

## 2014-11-02 ENCOUNTER — Encounter (HOSPITAL_COMMUNITY): Payer: Self-pay

## 2014-11-02 ENCOUNTER — Emergency Department (HOSPITAL_COMMUNITY)
Admission: EM | Admit: 2014-11-02 | Discharge: 2014-11-02 | Disposition: A | Discharge status: Home / Self Care | Payer: Medicaid Other | Attending: Emergency Medicine | Admitting: Emergency Medicine

## 2014-11-02 DIAGNOSIS — N390 Urinary tract infection, site not specified: Secondary | ICD-10-CM | POA: Diagnosis not present

## 2014-11-02 DIAGNOSIS — F3130 Bipolar disorder, current episode depressed, mild or moderate severity, unspecified: Secondary | ICD-10-CM | POA: Diagnosis not present

## 2014-11-02 DIAGNOSIS — Z79899 Other long term (current) drug therapy: Secondary | ICD-10-CM | POA: Diagnosis not present

## 2014-11-02 DIAGNOSIS — R109 Unspecified abdominal pain: Secondary | ICD-10-CM | POA: Diagnosis present

## 2014-11-02 DIAGNOSIS — F419 Anxiety disorder, unspecified: Secondary | ICD-10-CM | POA: Insufficient documentation

## 2014-11-02 DIAGNOSIS — E669 Obesity, unspecified: Secondary | ICD-10-CM | POA: Diagnosis not present

## 2014-11-02 DIAGNOSIS — J45909 Unspecified asthma, uncomplicated: Secondary | ICD-10-CM | POA: Diagnosis not present

## 2014-11-02 DIAGNOSIS — Z3202 Encounter for pregnancy test, result negative: Secondary | ICD-10-CM | POA: Diagnosis not present

## 2014-11-02 DIAGNOSIS — R110 Nausea: Secondary | ICD-10-CM

## 2014-11-02 HISTORY — DX: Bipolar disorder, unspecified: F31.9

## 2014-11-02 HISTORY — DX: Unspecified asthma, uncomplicated: J45.909

## 2014-11-02 LAB — URINALYSIS, ROUTINE W REFLEX MICROSCOPIC
Bilirubin Urine: NEGATIVE
Glucose, UA: NEGATIVE mg/dL
Hgb urine dipstick: NEGATIVE
Ketones, ur: NEGATIVE mg/dL
Nitrite: NEGATIVE
Protein, ur: NEGATIVE mg/dL
Specific Gravity, Urine: 1.027 (ref 1.005–1.030)
Urobilinogen, UA: 0.2 mg/dL (ref 0.0–1.0)
pH: 6 (ref 5.0–8.0)

## 2014-11-02 LAB — I-STAT CHEM 8, ED
BUN: 7 mg/dL (ref 6–20)
Calcium, Ion: 1.17 mmol/L (ref 1.12–1.23)
Chloride: 108 mmol/L (ref 101–111)
Creatinine, Ser: 0.6 mg/dL (ref 0.44–1.00)
Glucose, Bld: 112 mg/dL — ABNORMAL HIGH (ref 65–99)
HCT: 40 % (ref 36.0–46.0)
Hemoglobin: 13.6 g/dL (ref 12.0–15.0)
Potassium: 3.6 mmol/L (ref 3.5–5.1)
Sodium: 138 mmol/L (ref 135–145)
TCO2: 20 mmol/L (ref 0–100)

## 2014-11-02 LAB — URINE MICROSCOPIC-ADD ON

## 2014-11-02 LAB — PREGNANCY, URINE: Preg Test, Ur: NEGATIVE

## 2014-11-02 MED ORDER — CEPHALEXIN 500 MG PO CAPS: 500.0000 mg | ORAL_CAPSULE | Freq: Two times a day (BID) | ORAL | Status: DC

## 2014-11-02 MED ORDER — PHENAZOPYRIDINE HCL 200 MG PO TABS: 200.0000 mg | ORAL_TABLET | Freq: Three times a day (TID) | ORAL | Status: DC

## 2014-11-02 MED ORDER — SODIUM CHLORIDE 0.9 % IV BOLUS (SEPSIS)
1000.0000 mL | Freq: Once | INTRAVENOUS | Status: AC
  Administered 2014-11-02: 1000 mL via INTRAVENOUS

## 2014-11-02 MED ORDER — KETOROLAC TROMETHAMINE 30 MG/ML IJ SOLN
30.0000 mg | Freq: Once | INTRAMUSCULAR | Status: AC
  Administered 2014-11-02: 30 mg via INTRAVENOUS
  Filled 2014-11-02: qty 1

## 2014-11-02 MED ORDER — ONDANSETRON HCL 4 MG PO TABS: 4.0000 mg | ORAL_TABLET | Freq: Four times a day (QID) | ORAL | Status: DC

## 2014-11-02 MED ORDER — ONDANSETRON HCL 4 MG/2ML IJ SOLN
4.0000 mg | Freq: Once | INTRAMUSCULAR | Status: AC
  Administered 2014-11-02: 4 mg via INTRAVENOUS
  Filled 2014-11-02: qty 2

## 2014-11-02 NOTE — ED Notes (Signed)
Pt c/o dysuria x 2-3 days and bilateral flank pain x 1 days.  Pain score 6/10.  Pt reports frequent UTIs.  Sts "I was drinking cranberry juice, but it isn't helping."

## 2014-11-02 NOTE — ED Provider Notes (Signed)
CSN: [id number]     Arrival date & time 11/02/14  1123 History   First MD Initiated Contact with Patient 11/02/14 1129     Chief Complaint  Patient presents with  . Flank Pain  . Dysuria     (Consider location/radiation/quality/duration/timing/severity/associated sxs/prior Treatment) HPI Pt is a 24yo female with hx of anxiety, depression, PTSD, bipolar depression, and asthma, presenting to ED with c/o 1 week hx of UTI symptoms. Pt reports urinary frequency, urgency, malodorous cloudy urine for 1 week, then developed burning with urination 2-3 days ago with bilateral flank pain. Pain is constant, aching and sore, waxes and wanes, 6/10 at worst. No medications tried at home.  Pt states she has tried drinking cranberry juice w/o relief in symptoms.  Pt states he has had several UTIs in the past and symptoms feel similar to prior UTIs.  Last UTI was 1 year ago.  Reports swimming in "stangant water" prior to symptom onset. Pt also reports vaginal itching and burning but states she is not concerned for yeast infection or UTI. Does not want a pelvic exam. Pt does report hx of renal stones but states current symptoms are more similar to her last UTI. Denies fever or chills. Reports nausea and vomiting yesterday but just nausea today. No diarrhea. LMP: 09/12/14. Not concerned for pregnancy.   Past Medical History  Diagnosis Date  . Anxiety   . Depression   . PTSD (post-traumatic stress disorder)   . Bipolar depression   . Asthma    Past Surgical History  Procedure Laterality Date  . Breast surgery    . Knee arthroscopy Left   . Tonsillectomy     History reviewed. No pertinent family history. History  Substance Use Topics  . Smoking status: Never Smoker   . Smokeless tobacco: Not on file  . Alcohol Use: No   OB History    No data available     Review of Systems  Constitutional: Negative for fever, chills, diaphoresis, appetite change and fatigue.  Gastrointestinal: Positive for nausea  and vomiting. Negative for abdominal pain and diarrhea.  Genitourinary: Positive for dysuria, urgency, frequency, hematuria ( "dark and cloudy"), flank pain ( bilateral) and vaginal pain ( itching and burning). Negative for decreased urine volume, vaginal bleeding, vaginal discharge and pelvic pain.  Musculoskeletal: Positive for back pain ( bilateral lower). Negative for myalgias, neck pain and neck stiffness.  Skin: Negative for rash and wound.  All other systems reviewed and are negative.     Allergies  Review of patient's allergies indicates no known allergies.  Home Medications   Prior to Admission medications   Medication Sig Start Date End Date Taking? Authorizing Provider  albuterol (PROVENTIL HFA;VENTOLIN HFA) 108 (90 BASE) MCG/ACT inhaler Inhale 2 puffs into the lungs every 6 (six) hours as needed for wheezing or shortness of breath.   Yes Historical Provider, MD  citalopram (CELEXA) 40 MG tablet Take 40 mg by mouth daily.   Yes Historical Provider, MD  Etonogestrel (IMPLANON [place]) Inject into the skin once.   Yes Historical Provider, MD  hydrOXYzine (ATARAX/VISTARIL) 50 MG tablet Take 100 mg by mouth at bedtime.   Yes Historical Provider, MD  cephALEXin (KEFLEX) 500 MG capsule Take 1 capsule (500 mg total) by mouth 4 (four) times daily. Patient not taking: Reported on 11/02/2014 07/29/13   Sunnie Nielsen, MD  cephALEXin (KEFLEX) 500 MG capsule Take 1 capsule (500 mg total) by mouth 2 (two) times daily. For 7 days 11/02/14  Junius Finner, PA-C  ciprofloxacin (CIPRO) 500 MG tablet Take 1 tablet (500 mg total) by mouth 2 (two) times daily. For infection. Patient not taking: Reported on 11/02/2014 07/28/13   Tamala Julian, PA-C  citalopram (CELEXA) 20 MG tablet Take 1 tablet (20 mg total) by mouth daily. For depression. Patient not taking: Reported on 11/02/2014 07/28/13   Tamala Julian, PA-C  HYDROcodone-acetaminophen (NORCO) 5-325 MG per tablet Take 1 tablet by mouth every 6 (six) hours  as needed for moderate pain. Patient not taking: Reported on 11/02/2014 12/27/13   Graylon Good, PA-C  ondansetron (ZOFRAN) 4 MG tablet Take 1 tablet (4 mg total) by mouth every 6 (six) hours. Patient not taking: Reported on 11/02/2014 07/29/13   Sunnie Nielsen, MD  ondansetron (ZOFRAN) 4 MG tablet Take 1 tablet (4 mg total) by mouth every 6 (six) hours. 11/02/14   Junius Finner, PA-C  phenazopyridine (PYRIDIUM) 200 MG tablet Take 1 tablet (200 mg total) by mouth 3 (three) times daily. 11/02/14   Junius Finner, PA-C  traMADol (ULTRAM) 50 MG tablet Take 1 tablet (50 mg total) by mouth every 6 (six) hours as needed. Patient not taking: Reported on 11/02/2014 07/29/13   Sunnie Nielsen, MD  traZODone (DESYREL) 50 MG tablet Take 1 tablet (50 mg total) by mouth at bedtime and may repeat dose one time if needed. For insomnia. Patient not taking: Reported on 11/02/2014 07/28/13   Rona Ravens Mashburn, PA-C   BP 94/51 mmHg  Pulse 72  Temp(Src) 98 F (36.7 C) (Oral)  Resp 20  SpO2 99% Physical Exam  Constitutional: She appears well-developed and well-nourished. No distress.  Obese female sitting with legs in criss-cross position on exam bed, NAD. Non-toxic appearing.  HENT:  Head: Normocephalic and atraumatic.  Eyes: Conjunctivae are normal. No scleral icterus.  Neck: Normal range of motion. Neck supple.  Cardiovascular: Normal rate, regular rhythm and normal heart sounds.   Pulmonary/Chest: Effort normal and breath sounds normal. No respiratory distress. She has no wheezes. She has no rales. She exhibits no tenderness.  Abdominal: Soft. Bowel sounds are normal. She exhibits no distension and no mass. There is tenderness ( suprapubic). There is no rebound, no guarding and no CVA tenderness.  Obese abdomen, soft, tenderness in suprapubic region, no rebound or guarding. No CVAT  Genitourinary:  Pt declined  Musculoskeletal: Normal range of motion.  Neurological: She is alert.  Skin: Skin is warm and dry. She is  not diaphoretic.  Nursing note and vitals reviewed.   ED Course  Procedures (including critical care time) Labs Review Labs Reviewed  URINALYSIS, ROUTINE W REFLEX MICROSCOPIC (NOT AT [facility]) - Abnormal; Notable for the following:    APPearance CLOUDY (*)    Leukocytes, UA SMALL (*)    All other components within normal limits  URINE MICROSCOPIC-ADD ON - Abnormal; Notable for the following:    Squamous Epithelial / LPF MANY (*)    Bacteria, UA MANY (*)    All other components within normal limits  I-STAT CHEM 8, ED - Abnormal; Notable for the following:    Glucose, Bld 112 (*)    All other components within normal limits  PREGNANCY, URINE    Imaging Review No results found.   EKG Interpretation None      MDM   Final diagnoses:  UTI (lower urinary tract infection)  Nausea    Pt is 23yo female with hx of UTIs presenting to ED with c/o symptoms c/w prior UTIs. Pt appears  well, non-toxic. Pt did mention vaginal itching and burning but declined pelvic exam to check of yeast infection or STDs.   i-stat chem 8: unremarkable.  UA: many bacteria with 7-10 WBC.  Pt is symptomatic, will tx with pyridium and keflex. Home care instructions provided. F/u with PCP next week if symptoms not improving. Return precautions provided. Pt verbalized understanding and agreement with tx plan.   Junius Finner, PA-C 11/02/14 1557  Junius Finner, PA-C 11/02/14 1557  Kristen N Ward, DO 11/02/14 1610

## 2015-01-24 ENCOUNTER — Encounter (HOSPITAL_COMMUNITY): Payer: Self-pay | Admitting: *Deleted

## 2015-01-24 ENCOUNTER — Emergency Department (INDEPENDENT_AMBULATORY_CARE_PROVIDER_SITE_OTHER)
Admission: EM | Admit: 2015-01-24 | Discharge: 2015-01-24 | Disposition: A | Discharge status: Home / Self Care | Payer: Self-pay | Source: Home / Self Care | Attending: Family Medicine | Admitting: Family Medicine

## 2015-01-24 DIAGNOSIS — J0101 Acute recurrent maxillary sinusitis: Secondary | ICD-10-CM

## 2015-01-24 LAB — POCT RAPID STREP A: Streptococcus, Group A Screen (Direct): NEGATIVE

## 2015-01-24 MED ORDER — DOXYCYCLINE HYCLATE 100 MG PO CAPS: 100.0000 mg | ORAL_CAPSULE | Freq: Two times a day (BID) | ORAL | Status: DC

## 2015-01-24 MED ORDER — IPRATROPIUM BROMIDE 0.06 % NA SOLN
2.0000 | Freq: Four times a day (QID) | NASAL | Status: DC
Start: 2015-01-24 — End: 2017-05-14

## 2015-01-24 NOTE — ED Provider Notes (Signed)
CSN: [id number]     Arrival date & time 01/24/15  4098 History   First MD Initiated Contact with Patient 01/24/15 2020     Chief Complaint  Patient presents with  . Sore Throat   (Consider location/radiation/quality/duration/timing/severity/associated sxs/prior Treatment) Patient is a 24 y.o. female presenting with URI. The history is provided by the patient.  URI Presenting symptoms: congestion, cough, rhinorrhea and sore throat   Presenting symptoms: no fever   Severity:  Moderate Onset quality:  Gradual Duration:  10 days Progression:  Worsening Chronicity:  New Relieved by:  None tried Worsened by:  Nothing tried Ineffective treatments:  None tried   Past Medical History  Diagnosis Date  . Anxiety   . Depression   . PTSD (post-traumatic stress disorder)   . Bipolar depression   . Asthma    Past Surgical History  Procedure Laterality Date  . Breast surgery    . Knee arthroscopy Left   . Tonsillectomy     History reviewed. No pertinent family history. Social History  Substance Use Topics  . Smoking status: Never Smoker   . Smokeless tobacco: None  . Alcohol Use: No   OB History    No data available     Review of Systems  Constitutional: Negative for fever.  HENT: Positive for congestion, postnasal drip, rhinorrhea and sore throat.   Respiratory: Positive for cough.   Cardiovascular: Negative.   Gastrointestinal: Negative.     Allergies  Review of patient's allergies indicates no known allergies.  Home Medications   Prior to Admission medications   Medication Sig Start Date End Date Taking? Authorizing Provider  albuterol (PROVENTIL HFA;VENTOLIN HFA) 108 (90 BASE) MCG/ACT inhaler Inhale 2 puffs into the lungs every 6 (six) hours as needed for wheezing or shortness of breath.    Historical Provider, MD  cephALEXin (KEFLEX) 500 MG capsule Take 1 capsule (500 mg total) by mouth 4 (four) times daily. Patient not taking: Reported on 11/02/2014 07/29/13    Sunnie Nielsen, MD  cephALEXin (KEFLEX) 500 MG capsule Take 1 capsule (500 mg total) by mouth 2 (two) times daily. For 7 days 11/02/14   Junius Finner, PA-C  ciprofloxacin (CIPRO) 500 MG tablet Take 1 tablet (500 mg total) by mouth 2 (two) times daily. For infection. Patient not taking: Reported on 11/02/2014 07/28/13   Tamala Julian, PA-C  citalopram (CELEXA) 20 MG tablet Take 1 tablet (20 mg total) by mouth daily. For depression. Patient not taking: Reported on 11/02/2014 07/28/13   Tamala Julian, PA-C  citalopram (CELEXA) 40 MG tablet Take 40 mg by mouth daily.    Historical Provider, MD  doxycycline (VIBRAMYCIN) 100 MG capsule Take 1 capsule (100 mg total) by mouth 2 (two) times daily. 01/24/15   Linna Hoff, MD  Etonogestrel [facility]) Inject into the skin once.    Historical Provider, MD  HYDROcodone-acetaminophen (NORCO) 5-325 MG per tablet Take 1 tablet by mouth every 6 (six) hours as needed for moderate pain. Patient not taking: Reported on 11/02/2014 12/27/13   Graylon Good, PA-C  hydrOXYzine (ATARAX/VISTARIL) 50 MG tablet Take 100 mg by mouth at bedtime.    Historical Provider, MD  ipratropium (ATROVENT) 0.06 % nasal spray Place 2 sprays into both nostrils 4 (four) times daily. 01/24/15   Linna Hoff, MD  ondansetron (ZOFRAN) 4 MG tablet Take 1 tablet (4 mg total) by mouth every 6 (six) hours. Patient not taking: Reported on 11/02/2014 07/29/13   Sunnie Nielsen, MD  ondansetron (ZOFRAN) 4 MG tablet Take 1 tablet (4 mg total) by mouth every 6 (six) hours. 11/02/14   Junius Finner, PA-C  phenazopyridine (PYRIDIUM) 200 MG tablet Take 1 tablet (200 mg total) by mouth 3 (three) times daily. 11/02/14   Junius Finner, PA-C  traMADol (ULTRAM) 50 MG tablet Take 1 tablet (50 mg total) by mouth every 6 (six) hours as needed. Patient not taking: Reported on 11/02/2014 07/29/13   Sunnie Nielsen, MD  traZODone (DESYREL) 50 MG tablet Take 1 tablet (50 mg total) by mouth at bedtime and may repeat dose one time  if needed. For insomnia. Patient not taking: Reported on 11/02/2014 07/28/13   Tamala Julian, PA-C   Meds Ordered and Administered this Visit  Medications - No data to display  BP 101/76 mmHg  Pulse 59  Temp(Src) 98.5 F (36.9 C) (Oral)  Resp 16  SpO2 95% No data found.   Physical Exam  Constitutional: She is oriented to person, place, and time. She appears well-developed and well-nourished. No distress.  HENT:  Right Ear: External ear normal.  Left Ear: External ear normal.  Mouth/Throat: Oropharyngeal exudate present.  Eyes: Pupils are equal, round, and reactive to light.  Cardiovascular: Regular rhythm, normal heart sounds and intact distal pulses.   Pulmonary/Chest: Effort normal and breath sounds normal.  Lymphadenopathy:    She has no cervical adenopathy.  Neurological: She is alert and oriented to person, place, and time.  Skin: Skin is warm and dry.  Nursing note and vitals reviewed.   ED Course  Procedures (including critical care time)  Labs Review Labs Reviewed  CULTURE, GROUP A STREP  POCT RAPID STREP A   Strep neg. Imaging Review No results found.   Visual Acuity Review  Right Eye Distance:   Left Eye Distance:   Bilateral Distance:    Right Eye Near:   Left Eye Near:    Bilateral Near:         MDM   1. Acute recurrent maxillary sinusitis        Linna Hoff, MD 01/25/15 2006

## 2015-01-24 NOTE — ED Notes (Signed)
Pt  Reports symptoms  Of a  sorethroat  As   Well  As   Some  Cough  /  cngetion  With  Body  Aches  As   Well        Onset of  Symptoms      X   1  Week     Pt  Sitting upright on  The  Exam table  Speaking in   Complete  sentances  Appearing in no  Acute  Severe  Distress

## 2015-01-27 LAB — CULTURE, GROUP A STREP: Strep A Culture: NEGATIVE

## 2015-02-27 ENCOUNTER — Emergency Department (HOSPITAL_COMMUNITY)
Admission: EM | Admit: 2015-02-27 | Discharge: 2015-02-27 | Disposition: A | Discharge status: Home / Self Care | Payer: Medicaid Other | Attending: Emergency Medicine | Admitting: Emergency Medicine

## 2015-02-27 ENCOUNTER — Encounter (HOSPITAL_COMMUNITY): Payer: Self-pay

## 2015-02-27 DIAGNOSIS — H6121 Impacted cerumen, right ear: Secondary | ICD-10-CM | POA: Insufficient documentation

## 2015-02-27 DIAGNOSIS — R42 Dizziness and giddiness: Secondary | ICD-10-CM | POA: Insufficient documentation

## 2015-02-27 DIAGNOSIS — J45909 Unspecified asthma, uncomplicated: Secondary | ICD-10-CM | POA: Insufficient documentation

## 2015-02-27 DIAGNOSIS — R51 Headache: Secondary | ICD-10-CM | POA: Insufficient documentation

## 2015-02-27 DIAGNOSIS — H9201 Otalgia, right ear: Secondary | ICD-10-CM

## 2015-02-27 DIAGNOSIS — F319 Bipolar disorder, unspecified: Secondary | ICD-10-CM | POA: Insufficient documentation

## 2015-02-27 DIAGNOSIS — Z79899 Other long term (current) drug therapy: Secondary | ICD-10-CM | POA: Insufficient documentation

## 2015-02-27 DIAGNOSIS — Z792 Long term (current) use of antibiotics: Secondary | ICD-10-CM | POA: Insufficient documentation

## 2015-02-27 MED ORDER — CIPROFLOXACIN-DEXAMETHASONE 0.3-0.1 % OT SUSP: 4.0000 [drp] | Freq: Two times a day (BID) | OTIC | Status: DC

## 2015-02-27 MED ORDER — PSEUDOEPHEDRINE HCL 60 MG PO TABS: 60.0000 mg | ORAL_TABLET | Freq: Four times a day (QID) | ORAL | Status: DC | PRN

## 2015-02-27 MED ORDER — NAPROXEN 500 MG PO TABS: 500.0000 mg | ORAL_TABLET | Freq: Two times a day (BID) | ORAL | Status: DC

## 2015-02-27 NOTE — ED Notes (Signed)
Per pt, was seen 2 weeks ago with earache and tested for strep.  Pt has had continued pain. Was given a script for antibiotic but could not afford med.  Has not done any treatment since then.  Pain continues in ear and radiating down face

## 2015-02-27 NOTE — Discharge Instructions (Signed)
Please read and follow all provided instructions.  Your diagnoses today include:  1. Ear pain, right   2. Cerumen impaction, right     Tests performed today include:  Vital signs. See below for your results today.   Medications prescribed:  Ciprodex - ear drops for ear canal infection Naproxen - anti-inflammatory pain medication Do not exceed 500mg  naproxen every 12 hours, take with food  You have been prescribed an anti-inflammatory medication or NSAID. Take with food. Take smallest effective dose for the shortest duration needed for your pain. Stop taking if you experience stomach pain or vomiting.   Take any prescribed medications only as directed.  Home care instructions:  Follow any educational materials contained in this packet.  BE VERY CAREFUL not to take multiple medicines containing Tylenol (also called acetaminophen). Doing so can lead to an overdose which can damage your liver and cause liver failure and possibly death.   Follow-up instructions: Please follow-up with your primary care provider in the next 3 days for further evaluation of your symptoms.   Return instructions:   Please return to the Emergency Department if you experience worsening symptoms.   Please return if you have any other emergent concerns.  Additional Information:  Your vital signs today were: BP 113/91 mmHg   Pulse 78   Temp(Src) 98.7 F (37.1 C) (Oral)   Resp 18   SpO2 98% If your blood pressure (BP) was elevated above 135/85 this visit, please have this repeated by your doctor within one month. --------------

## 2015-02-27 NOTE — ED Provider Notes (Signed)
CSN: [id number]     Arrival date & time 02/27/15  1052 History   First MD Initiated Contact with Patient 02/27/15 1101     Chief Complaint  Patient presents with  . Otalgia     (Consider location/radiation/quality/duration/timing/severity/associated sxs/prior Treatment) HPI Comments: Patient presents with complaints of right ear pain, muffled hearing, headache, dizziness after recent URI infection. Patient states that she was tested for strep but did not hear back about results. She took doxycycline which she had left over from a previous illness. Pain gradually worsened after the URI symptoms improved. Pain radiates from her ear down her neck. She denies any recent fevers. No other treatments. She has some residual nasal congestion. Onset of symptoms acute. Course is constant.  Patient is a 24 y.o. female presenting with ear pain. The history is provided by the patient.  Otalgia Associated symptoms: congestion, headaches and hearing loss   Associated symptoms: no abdominal pain, no cough, no diarrhea, no fever, no rash, no rhinorrhea, no sore throat and no vomiting     Past Medical History  Diagnosis Date  . Anxiety   . Depression   . PTSD (post-traumatic stress disorder)   . Bipolar depression (HCC)   . Asthma    Past Surgical History  Procedure Laterality Date  . Breast surgery    . Knee arthroscopy Left   . Tonsillectomy     History reviewed. No pertinent family history. Social History  Substance Use Topics  . Smoking status: Never Smoker   . Smokeless tobacco: None  . Alcohol Use: No   OB History    No data available     Review of Systems  Constitutional: Negative for fever.  HENT: Positive for congestion, ear pain and hearing loss. Negative for rhinorrhea and sore throat.   Eyes: Negative for redness.  Respiratory: Negative for cough.   Cardiovascular: Negative for chest pain.  Gastrointestinal: Negative for nausea, vomiting, abdominal pain and diarrhea.   Genitourinary: Negative for dysuria.  Musculoskeletal: Negative for myalgias.  Skin: Negative for rash.  Neurological: Positive for dizziness and headaches.      Allergies  Review of patient's allergies indicates no known allergies.  Home Medications   Prior to Admission medications   Medication Sig Start Date End Date Taking? Authorizing Provider  albuterol (PROVENTIL HFA;VENTOLIN HFA) 108 (90 BASE) MCG/ACT inhaler Inhale 2 puffs into the lungs every 6 (six) hours as needed for wheezing or shortness of breath.    Historical Provider, MD  cephALEXin (KEFLEX) 500 MG capsule Take 1 capsule (500 mg total) by mouth 4 (four) times daily. Patient not taking: Reported on 11/02/2014 07/29/13   Sunnie NielsenBrian Opitz, MD  cephALEXin (KEFLEX) 500 MG capsule Take 1 capsule (500 mg total) by mouth 2 (two) times daily. For 7 days 11/02/14   Junius FinnerErin O'Malley, PA-C  ciprofloxacin (CIPRO) 500 MG tablet Take 1 tablet (500 mg total) by mouth 2 (two) times daily. For infection. Patient not taking: Reported on 11/02/2014 07/28/13   Tamala JulianNeil T Mashburn, PA-C  citalopram (CELEXA) 20 MG tablet Take 1 tablet (20 mg total) by mouth daily. For depression. Patient not taking: Reported on 11/02/2014 07/28/13   Tamala JulianNeil T Mashburn, PA-C  citalopram (CELEXA) 40 MG tablet Take 40 mg by mouth daily.    Historical Provider, MD  doxycycline (VIBRAMYCIN) 100 MG capsule Take 1 capsule (100 mg total) by mouth 2 (two) times daily. 01/24/15   Linna HoffJames D Kindl, MD  Etonogestrel [facility](IM[facility] [place]) Inject into the skin once.  Historical Provider, MD  HYDROcodone-acetaminophen (NORCO) 5-325 MG per tablet Take 1 tablet by mouth every 6 (six) hours as needed for moderate pain. Patient not taking: Reported on 11/02/2014 12/27/13   Graylon Good, PA-C  hydrOXYzine (ATARAX/VISTARIL) 50 MG tablet Take 100 mg by mouth at bedtime.    Historical Provider, MD  ipratropium (ATROVENT) 0.06 % nasal spray Place 2 sprays into both nostrils 4 (four) times daily. 01/24/15   Linna Hoff, MD  ondansetron (ZOFRAN) 4 MG tablet Take 1 tablet (4 mg total) by mouth every 6 (six) hours. Patient not taking: Reported on 11/02/2014 07/29/13   Sunnie Nielsen, MD  ondansetron (ZOFRAN) 4 MG tablet Take 1 tablet (4 mg total) by mouth every 6 (six) hours. 11/02/14   Junius Finner, PA-C  phenazopyridine (PYRIDIUM) 200 MG tablet Take 1 tablet (200 mg total) by mouth 3 (three) times daily. 11/02/14   Junius Finner, PA-C  traMADol (ULTRAM) 50 MG tablet Take 1 tablet (50 mg total) by mouth every 6 (six) hours as needed. Patient not taking: Reported on 11/02/2014 07/29/13   Sunnie Nielsen, MD  traZODone (DESYREL) 50 MG tablet Take 1 tablet (50 mg total) by mouth at bedtime and may repeat dose one time if needed. For insomnia. Patient not taking: Reported on 11/02/2014 07/28/13   Rona Ravens Mashburn, PA-C   BP 113/91 mmHg  Pulse 78  Temp(Src) 98.7 F (37.1 C) (Oral)  Resp 18  SpO2 98%   Physical Exam  Constitutional: She appears well-developed and well-nourished.  HENT:  Head: Normocephalic and atraumatic.  Right Ear: Tympanic membrane, external ear and ear canal normal. No drainage, swelling or tenderness. No foreign bodies. No mastoid tenderness. Tympanic membrane is not injected, not scarred, not perforated, not erythematous and not retracted. No hemotympanum. Decreased hearing is noted.  Left Ear: Tympanic membrane, external ear and ear canal normal.  Nose: Nose normal. No mucosal edema or rhinorrhea.  Mouth/Throat: Uvula is midline, oropharynx is clear and moist and mucous membranes are normal. Mucous membranes are not dry. No oral lesions. No trismus in the jaw. No uvula swelling. No oropharyngeal exudate, posterior oropharyngeal edema, posterior oropharyngeal erythema or tonsillar abscesses.  Eyes: Conjunctivae are normal. Right eye exhibits no discharge. Left eye exhibits no discharge.  Neck: Normal range of motion. Neck supple.  Cardiovascular: Normal rate, regular rhythm and normal heart  sounds.   Pulmonary/Chest: Effort normal and breath sounds normal. No respiratory distress. She has no wheezes. She has no rales.  Abdominal: Soft. There is no tenderness.  Lymphadenopathy:    She has no cervical adenopathy.  Neurological: She is alert.  Skin: Skin is warm and dry.  Psychiatric: She has a normal mood and affect.  Nursing note and vitals reviewed.   ED Course  Procedures (including critical care time) Labs Review Labs Reviewed - No data to display  Imaging Review No results found. I have personally reviewed and evaluated these images and lab results as part of my medical decision-making.   EKG Interpretation None       11:24 AM Patient seen and examined. Mild cerumen impaction. Suspect eustachian tube dysfunction given recent URI. Patient unable to tolerate cerumen removal with loop. Will need irrigation with normal saline.    Vital signs reviewed and are as follows: BP 113/91 mmHg  Pulse 78  Temp(Src) 98.7 F (37.1 C) (Oral)  Resp 18  SpO2 98%  12:10 PM irrigation performed by nurse. There is some remaining wax over TM appears normal.  Canal appears inflamed.  Will discharge to home with prescription for Ciprodex eardrops, NSAID, decongestant.  Referral for ENT if not improved in 5-7 days.  Encouraged to return to emergency department with worsening pain, swelling, redness, or fever.   MDM   Final diagnoses:  Ear pain, right  Cerumen impaction, right   Patient with ear pain after URI. Patient has cerumen infection which was cleared. Also irritation of the canal. Will treat for otitis externa. Eustachian tube dysfunction is also a concern so patient given decongestants as well as NSAIDs for pain. She appears well, nontoxic. No fever. No systemic symptoms of illness or neuro compromise. Follow-up as above.    Renne Crigler, PA-C 02/27/15 1212  Arby Barrette, MD 03/02/15 [phone]

## 2015-03-08 ENCOUNTER — Inpatient Hospital Stay: Payer: Self-pay | Admitting: Family Medicine

## 2015-04-02 ENCOUNTER — Encounter (HOSPITAL_COMMUNITY): Payer: Self-pay | Admitting: Emergency Medicine

## 2015-04-02 ENCOUNTER — Emergency Department (HOSPITAL_COMMUNITY)
Admission: EM | Admit: 2015-04-02 | Discharge: 2015-04-02 | Disposition: A | Discharge status: Home / Self Care | Payer: Medicaid Other | Attending: Emergency Medicine | Admitting: Emergency Medicine

## 2015-04-02 DIAGNOSIS — R059 Cough, unspecified: Secondary | ICD-10-CM

## 2015-04-02 DIAGNOSIS — J45909 Unspecified asthma, uncomplicated: Secondary | ICD-10-CM | POA: Insufficient documentation

## 2015-04-02 DIAGNOSIS — Z9889 Other specified postprocedural states: Secondary | ICD-10-CM | POA: Insufficient documentation

## 2015-04-02 DIAGNOSIS — J029 Acute pharyngitis, unspecified: Secondary | ICD-10-CM | POA: Insufficient documentation

## 2015-04-02 DIAGNOSIS — R05 Cough: Secondary | ICD-10-CM

## 2015-04-02 DIAGNOSIS — J069 Acute upper respiratory infection, unspecified: Secondary | ICD-10-CM | POA: Insufficient documentation

## 2015-04-02 DIAGNOSIS — F419 Anxiety disorder, unspecified: Secondary | ICD-10-CM | POA: Insufficient documentation

## 2015-04-02 DIAGNOSIS — Z792 Long term (current) use of antibiotics: Secondary | ICD-10-CM | POA: Insufficient documentation

## 2015-04-02 DIAGNOSIS — Z79899 Other long term (current) drug therapy: Secondary | ICD-10-CM | POA: Insufficient documentation

## 2015-04-02 DIAGNOSIS — Z791 Long term (current) use of non-steroidal anti-inflammatories (NSAID): Secondary | ICD-10-CM | POA: Insufficient documentation

## 2015-04-02 DIAGNOSIS — J3489 Other specified disorders of nose and nasal sinuses: Secondary | ICD-10-CM

## 2015-04-02 DIAGNOSIS — H9203 Otalgia, bilateral: Secondary | ICD-10-CM | POA: Insufficient documentation

## 2015-04-02 LAB — RAPID STREP SCREEN (MED CTR MEBANE ONLY): Streptococcus, Group A Screen (Direct): NEGATIVE

## 2015-04-02 MED ORDER — ACETAMINOPHEN 325 MG PO TABS
ORAL_TABLET | ORAL | Status: AC
  Filled 2015-04-02: qty 2

## 2015-04-02 MED ORDER — ACETAMINOPHEN 325 MG PO TABS
650.0000 mg | ORAL_TABLET | Freq: Once | ORAL | Status: AC | PRN
  Administered 2015-04-02: 650 mg via ORAL

## 2015-04-02 NOTE — Discharge Instructions (Signed)
Continue to stay well-hydrated. Gargle warm salt water and spit it out. Use chloraseptic spray and throat lozenges as needed for sore throat. Continue to alternate between Tylenol and Ibuprofen for pain or fever. Use Mucinex for cough suppression/expectoration of mucus. Use netipot and flonase to help with nasal congestion. May consider over-the-counter Benadryl or other antihistamine to decrease secretions and for watery itchy eyes. Followup with [facility] in 5-7 days for recheck of ongoing symptoms. Return to emergency department for emergent changing or worsening of symptoms.   Cough, Adult A cough helps to clear your throat and lungs. A cough may last only 2-3 weeks (acute), or it may last longer than 8 weeks (chronic). Many different things can cause a cough. A cough may be a sign of an illness or another medical condition. HOME CARE  Pay attention to any changes in your cough.  Take medicines only as told by your doctor.  If you were prescribed an antibiotic medicine, take it as told by your doctor. Do not stop taking it even if you start to feel better.  Talk with your doctor before you try using a cough medicine.  Drink enough fluid to keep your pee (urine) clear or pale yellow.  If the air is dry, use a cold steam vaporizer or humidifier in your home.  Stay away from things that make you cough at work or at home.  If your cough is worse at night, try using extra pillows to raise your head up higher while you sleep.  Do not smoke, and try not to be around smoke. If you need help quitting, ask your doctor.  Do not have caffeine.  Do not drink alcohol.  Rest as needed. GET HELP IF:  You have new problems (symptoms).  You cough up yellow fluid (pus).  Your cough does not get better after 2-3 weeks, or your cough gets worse.  Medicine does not help your cough and you are not sleeping well.  You have pain that gets worse or pain that is not helped with  medicine.  You have a fever.  You are losing weight and you do not know why.  You have night sweats. GET HELP RIGHT AWAY IF:  You cough up blood.  You have trouble breathing.  Your heartbeat is very fast.   This information is not intended to replace advice given to you by your health care provider. Make sure you discuss any questions you have with your health care provider.   Document Released: 01/08/2011 Document Revised: 01/16/2015 Document Reviewed: 07/04/2014 Elsevier Interactive Patient Education 2016 [employer]  Sore Throat A sore throat is a painful, burning, sore, or scratchy feeling of the throat. There may be pain or tenderness when swallowing or talking. You may have other symptoms with a sore throat. These include coughing, sneezing, fever, or a swollen neck. A sore throat is often the first sign of another sickness. These sicknesses may include a cold, flu, strep throat, or an infection called mono. Most sore throats go away without medical treatment.  HOME CARE   Only take medicine as told by your doctor.  Drink enough fluids to keep your pee (urine) clear or pale yellow.  Rest as needed.  Try using throat sprays, lozenges, or suck on hard candy (if older than 4 years or as told).  Sip warm liquids, such as broth, herbal tea, or warm water with honey. Try sucking on frozen ice pops or drinking cold liquids.  Rinse  the mouth (gargle) with salt water. Mix 1 teaspoon salt with 8 ounces of water.  Do not smoke. Avoid being around others when they are smoking.  Put a humidifier in your bedroom at night to moisten the air. You can also turn on a hot shower and sit in the bathroom for 5-10 minutes. Be sure the bathroom door is closed. GET HELP RIGHT AWAY IF:   You have trouble breathing.  You cannot swallow fluids, soft foods, or your spit (saliva).  You have more puffiness (swelling) in the throat.  Your sore throat does not get better in 7 days.  You  feel sick to your stomach (nauseous) and throw up (vomit).  You have a fever or lasting symptoms for more than 2-3 days.  You have a fever and your symptoms suddenly get worse. MAKE SURE YOU:   Understand these instructions.  Will watch your condition.  Will get help right away if you are not doing well or get worse.   This information is not intended to replace advice given to you by your health care provider. Make sure you discuss any questions you have with your health care provider.   Document Released: 02/04/2008 Document Revised: 01/20/2012 Document Reviewed: 01/03/2012 Elsevier Interactive Patient Education 2016 [employer] Inc[employer]  Viral Infections A virus is a type of germ. Viruses can cause:  Minor sore throats.  Aches and pains.  Headaches.  Runny nose.  Rashes.  Watery eyes.  Tiredness.  Coughs.  Loss of appetite.  Feeling sick to your stomach (nausea).  Throwing up (vomiting).  Watery poop (diarrhea). HOME CARE   Only take medicines as told by your doctor.  Drink enough water and fluids to keep your pee (urine) clear or pale yellow. Sports drinks are a good choice.  Get plenty of rest and eat healthy. Soups and broths with crackers or rice are fine. GET HELP RIGHT AWAY IF:   You have a very bad headache.  You have shortness of breath.  You have chest pain or neck pain.  You have an unusual rash.  You cannot stop throwing up.  You have watery poop that does not stop.  You cannot keep fluids down.  You or your child has a temperature by mouth above 102 F (38.9 C), not controlled by medicine.  Your baby is older than 3 months with a rectal temperature of 102 F (38.9 C) or higher.  Your baby is 783 months old or younger with a rectal temperature of 100.4 F (38 C) or higher. MAKE SURE YOU:   Understand these instructions.  Will watch this condition.  Will get help right away if you are not doing well or get worse.   This  information is not intended to replace advice given to you by your health care provider. Make sure you discuss any questions you have with your health care provider.   Document Released: 04/09/2008 Document Revised: 07/20/2011 Document Reviewed: 10/03/2014 Elsevier Interactive Patient Education [employer]2016 [employer]

## 2015-04-02 NOTE — ED Provider Notes (Signed)
CSN: [id number]     Arrival date & time 04/02/15  1502 History  By signing my name below, I, Lyndel Safe, attest that this documentation has been prepared under the direction and in the presence of Roda Lauture Camprubi-Soms, PA-C. Electronically Signed: Lyndel Safe, ED Scribe. 04/02/2015. 5:05 PM.  Chief Complaint  Patient presents with  . Generalized Body Aches  . Sore Throat   Patient is a 24 y.o. female presenting with pharyngitis. The history is provided by the patient. No language interpreter was used.  Sore Throat This is a new problem. The current episode started yesterday. The problem occurs constantly. The problem has been rapidly worsening. Pertinent negatives include no chest pain, no abdominal pain and no shortness of breath. The symptoms are aggravated by swallowing. Nothing relieves the symptoms. Treatments tried: Mucinex. The treatment provided no relief.   HPI Comments: Kaitlyn Luna is a 24 y.o. female, with a PMHx of asthma and PSHx of tonsillectomy, who presents to the Emergency Department complaining of a worsening, constant, sharp, 8/10 sore throat onset 1 day ago that radiates to bilateral ears, and that is worse with swallowing but pt is able to tolerate secretions, and unrelieved by Mucinex. She associates a fever with a Tmax of 100.55F yesterday, chills, a productive cough with yellow and occasional blood streaked sputum, nasal and chest congestion, clear rhinorrhea, nausea, and generalized myalgias. Pt is positive for sick contacts. She denies drooling, trismus, bilateral ear drainage, wheezing, CP, SOB,  vomiting, diarrhea, constipation, abdominal pain, arthralgias, hematuria, dysuria, numbness, tingling or weakness. No recent travel abroad. She is not followed by a PCP.    Past Medical History  Diagnosis Date  . Anxiety   . Depression   . PTSD (post-traumatic stress disorder)   . Bipolar depression (HCC)   . Asthma    Past Surgical History  Procedure  Laterality Date  . Breast surgery    . Knee arthroscopy Left   . Tonsillectomy     No family history on file. Social History  Substance Use Topics  . Smoking status: Never Smoker   . Smokeless tobacco: None  . Alcohol Use: No   OB History    No data available     Review of Systems  Constitutional: Positive for fever (Tmax 100.1) and chills.  HENT: Positive for congestion ( nasal and chest), ear pain (bilateral), rhinorrhea ( clear) and sore throat. Negative for drooling, ear discharge, hearing loss and trouble swallowing.   Respiratory: Positive for cough. Negative for shortness of breath, wheezing and stridor.   Cardiovascular: Negative for chest pain.  Gastrointestinal: Negative for nausea, vomiting, abdominal pain, diarrhea and constipation.  Genitourinary: Negative for dysuria and hematuria.  Musculoskeletal: Positive for myalgias ( generalized). Negative for arthralgias.  Skin: Negative for rash.  Allergic/Immunologic: Negative for immunocompromised state.  Neurological: Negative for syncope, weakness and numbness.  A complete 10 system review of systems was obtained and is otherwise negative except at noted in the HPI and PMH.   Allergies  Review of patient's allergies indicates no known allergies.  Home Medications   Prior to Admission medications   Medication Sig Start Date End Date Taking? Authorizing Provider  albuterol (PROVENTIL HFA;VENTOLIN HFA) 108 (90 BASE) MCG/ACT inhaler Inhale 2 puffs into the lungs every 6 (six) hours as needed for wheezing or shortness of breath.    Historical Provider, MD  ciprofloxacin-dexamethasone (CIPRODEX) otic suspension Place 4 drops into the right ear 2 (two) times daily. 02/27/15   Renne Crigler, PA-C  doxycycline (VIBRAMYCIN) 100 MG capsule Take 1 capsule (100 mg total) by mouth 2 (two) times daily. 01/24/15   Linna HoffJames D Kindl, MD  Etonogestrel [facility](IMPLANON [place]) Inject into the skin once.    Historical Provider, MD  hydrOXYzine  (ATARAX/VISTARIL) 50 MG tablet Take 100 mg by mouth at bedtime.    Historical Provider, MD  ipratropium (ATROVENT) 0.06 % nasal spray Place 2 sprays into both nostrils 4 (four) times daily. 01/24/15   Linna HoffJames D Kindl, MD  naproxen (NAPROSYN) 500 MG tablet Take 1 tablet (500 mg total) by mouth 2 (two) times daily. 02/27/15   Renne CriglerJoshua Geiple, PA-C  phenazopyridine (PYRIDIUM) 200 MG tablet Take 1 tablet (200 mg total) by mouth 3 (three) times daily. 11/02/14   Junius FinnerErin O'Malley, PA-C  pseudoephedrine (SUDAFED) 60 MG tablet Take 1 tablet (60 mg total) by mouth every 6 (six) hours as needed for congestion. 02/27/15   Renne CriglerJoshua Geiple, PA-C   BP 120/58 mmHg  Pulse 111  Temp(Src) 98.7 F (37.1 C) (Oral)  Resp 22  SpO2 98% Physical Exam  Constitutional: She is oriented to person, place, and time. Vital signs are normal. She appears well-developed and well-nourished.  Non-toxic appearance. No distress.  Afebrile, nontoxic, NAD  HENT:  Head: Normocephalic and atraumatic.  Right Ear: Hearing, external ear and ear canal normal.  Left Ear: Hearing, external ear and ear canal normal.  Nose: Mucosal edema and rhinorrhea present.  Mouth/Throat: Uvula is midline and mucous membranes are normal. No trismus in the jaw. No uvula swelling. Posterior oropharyngeal erythema present. No oropharyngeal exudate, posterior oropharyngeal edema or tonsillar abscesses.  Ears with cerumen impaction bilaterally. Nose with mucosal edema and rhinorrhea. Oropharynx mildly injected, without uvular swelling or deviation, no trismus or drooling, no tonsils present, no exudates.    Eyes: Conjunctivae and EOM are normal. Right eye exhibits no discharge. Left eye exhibits no discharge.  Neck: Normal range of motion. Neck supple.  Cardiovascular: Normal rate, regular rhythm, normal heart sounds and intact distal pulses.  Exam reveals no gallop and no friction rub.   No murmur heard. Initially tachycardic which resolved during exam   Pulmonary/Chest: Effort normal and breath sounds normal. No respiratory distress. She has no decreased breath sounds. She has no wheezes. She has no rhonchi. She has no rales.  CTAB in all lung fields, no w/r/r, no hypoxia or increased WOB, speaking in full sentences, SpO2 98% on RA.    Abdominal: Soft. Normal appearance and bowel sounds are normal. She exhibits no distension. There is no tenderness. There is no rigidity, no rebound, no guarding, no CVA tenderness, no tenderness at McBurney's point and negative Murphy's sign.  Musculoskeletal: Normal range of motion.  Lymphadenopathy:    She has cervical adenopathy.  Shotty cervical LAD bilaterally which is non TTP.   Neurological: She is alert and oriented to person, place, and time. She has normal strength. No sensory deficit.  Skin: Skin is warm, dry and intact. No rash noted.  Psychiatric: She has a normal mood and affect.  Nursing note and vitals reviewed.   ED Course  Procedures  DIAGNOSTIC STUDIES: Oxygen Saturation is 98% on RA, normal by my interpretation.    COORDINATION OF CARE: 4:58 PM Discussed treatment plan with pt at bedside which includes to advise pt she can use Chloraseptic spray and provide pt with a work note. Pt agreed to plan.  Labs Review Labs Reviewed  RAPID STREP SCREEN (NOT AT [facility])  CULTURE, GROUP A STREP  I have  personally reviewed and evaluated these lab results as part of my medical decision-making.  MDM   Final diagnoses:  URI (upper respiratory infection)  Cough  Pharyngitis  Rhinorrhea    24 y.o. female here with URI symptoms. Pt is afebrile with a clear lung exam. Mild rhinorrhea. No tonsils but RST obtained in triage was neg. Likely viral URI. Pt is agreeable to symptomatic treatment with close follow up with [facility] to establish care and f/up as needed but spoke at length about emergent changing or worsening of symptoms that should prompt return to ER. Pt voices understanding and is agreeable  to plan. Stable at time of discharge.   I personally performed the services described in this documentation, which was scribed in my presence. The recorded information has been reviewed and is accurate.  BP 124/76 mmHg  Pulse 96  Temp(Src) 99.5 F (37.5 C) (Oral)  Resp 22  SpO2 97%  Meds ordered this encounter  Medications  . acetaminophen (TYLENOL) tablet 650 mg    Sig:   . acetaminophen (TYLENOL) 325 MG tablet    Sig:     Spinks, Emelie   : cabinet override      [employer], PA-C 04/02/15 1708  Mirian Mo, MD 04/05/15 [id number]

## 2015-04-02 NOTE — ED Notes (Signed)
Pt reports body aches, sore throat, productive cough since yesterday. Pt reports fever of 101 yesterday. sts she has been unable to drink fluids d/t sore throat.

## 2015-04-05 LAB — CULTURE, GROUP A STREP

## 2015-05-12 DIAGNOSIS — J189 Pneumonia, unspecified organism: Secondary | ICD-10-CM

## 2015-05-12 HISTORY — DX: Pneumonia, unspecified organism: J18.9

## 2015-05-27 ENCOUNTER — Emergency Department (INDEPENDENT_AMBULATORY_CARE_PROVIDER_SITE_OTHER)
Admission: EM | Admit: 2015-05-27 | Discharge: 2015-05-27 | Disposition: A | Discharge status: Home / Self Care | Payer: Self-pay | Source: Home / Self Care | Attending: Family Medicine | Admitting: Family Medicine

## 2015-05-27 ENCOUNTER — Encounter (HOSPITAL_COMMUNITY): Payer: Self-pay | Admitting: Emergency Medicine

## 2015-05-27 DIAGNOSIS — IMO0002 Reserved for concepts with insufficient information to code with codable children: Secondary | ICD-10-CM

## 2015-05-27 DIAGNOSIS — M7711 Lateral epicondylitis, right elbow: Secondary | ICD-10-CM

## 2015-05-27 DIAGNOSIS — G5601 Carpal tunnel syndrome, right upper limb: Secondary | ICD-10-CM

## 2015-05-27 DIAGNOSIS — M659 Synovitis and tenosynovitis, unspecified: Secondary | ICD-10-CM

## 2015-05-27 MED ORDER — NAPROXEN 375 MG PO TABS: 375.0000 mg | ORAL_TABLET | Freq: Two times a day (BID) | ORAL | Status: DC

## 2015-05-27 NOTE — ED Notes (Signed)
Patient reports a two week history of swelling in right hand and pain radiating from mid-forearm and into right hand.  Pain and numbness/tingling is intermittent.  Patient has had carpel tunnel in left hand.  Patient able to move right hand, brisk cap refill, palpable pulses.  Pain wakes patient at night.  Patient is left handed.  Patient works in a Surveyor, miningkitchen.  Patient had no injury at onset of these complaints 2 weeks ago, but had a fall 1 week ago on ice striking right elbow per patient.

## 2015-05-27 NOTE — Discharge Instructions (Signed)
Carpal Tunnel Syndrome Carpal tunnel syndrome is a condition that causes pain in your hand and arm. The carpal tunnel is a narrow area located on the palm side of your wrist. Repeated wrist motion or certain diseases may cause swelling within the tunnel. This swelling pinches the main nerve in the wrist (median nerve). CAUSES  This condition may be caused by:   Repeated wrist motions.  Wrist injuries.  Arthritis.  A cyst or tumor in the carpal tunnel.  Fluid buildup during pregnancy. Sometimes the cause of this condition is not known.  RISK FACTORS This condition is more likely to develop in:   People who have jobs that cause them to repeatedly move their wrists in the same motion, such as butchers and cashiers.  Women.  People with certain conditions, such as:  Diabetes.  Obesity.  An underactive thyroid (hypothyroidism).  Kidney failure. SYMPTOMS  Symptoms of this condition include:   A tingling feeling in your fingers, especially in your thumb, index, and middle fingers.  Tingling or numbness in your hand.  An aching feeling in your entire arm, especially when your wrist and elbow are bent for long periods of time.  Wrist pain that goes up your arm to your shoulder.  Pain that goes down into your palm or fingers.  A weak feeling in your hands. You may have trouble grabbing and holding items. Your symptoms may feel worse during the night.  DIAGNOSIS  This condition is diagnosed with a medical history and physical exam. You may also have tests, including:   An electromyogram (EMG). This test measures electrical signals sent by your nerves into the muscles.  X-rays. TREATMENT  Treatment for this condition includes:  Lifestyle changes. It is important to stop doing or modify the activity that caused your condition.  Physical or occupational therapy.  Medicines for pain and inflammation. This may include medicine that is injected into your wrist.  A wrist  splint.  Surgery. HOME CARE INSTRUCTIONS  If You Have a Splint:  Wear it as told by your health care provider. Remove it only as told by your health care provider.  Loosen the splint if your fingers become numb and tingle, or if they turn cold and blue.  Keep the splint clean and dry. General Instructions  Take over-the-counter and prescription medicines only as told by your health care provider.  Rest your wrist from any activity that may be causing your pain. If your condition is work related, talk to your employer about changes that can be made, such as getting a wrist pad to use while typing.  If directed, apply ice to the painful area:  Put ice in a plastic bag.  Place a towel between your skin and the bag.  Leave the ice on for 20 minutes, 2-3 times per day.  Keep all follow-up visits as told by your health care provider. This is important.  Do any exercises as told by your health care provider, physical therapist, or occupational therapist. SEEK MEDICAL CARE IF:   You have new symptoms.  Your pain is not controlled with medicines.  Your symptoms get worse.   This information is not intended to replace advice given to you by your health care provider. Make sure you discuss any questions you have with your health care provider.   Document Released: 04/24/2000 Document Revised: 01/16/2015 Document Reviewed: 09/12/2014 Elsevier Interactive Patient Education 2016 Elsevier Inc.  Tendinitis Tendinitis is swelling and inflammation of the tendons. Tendons are band-like  tissues that connect muscle to bone. Tendinitis commonly occurs in the:   Shoulders (rotator cuff).  Heels (Achilles tendon).  Elbows (triceps tendon). CAUSES Tendinitis is usually caused by overusing the tendon, muscles, and joints involved. When the tissue surrounding a tendon (synovium) becomes inflamed, it is called tenosynovitis. Tendinitis commonly develops in people whose jobs require repetitive  motions. SYMPTOMS  Pain.  Tenderness.  Mild swelling. DIAGNOSIS Tendinitis is usually diagnosed by physical exam. Your health care provider may also order X-rays or other imaging tests. TREATMENT Your health care provider may recommend certain medicines or exercises for your treatment. HOME CARE INSTRUCTIONS   Use a sling or splint for as long as directed by your health care provider until the pain decreases.  Put ice on the injured area.  Put ice in a plastic bag.  Place a towel between your skin and the bag.  Leave the ice on for 15-20 minutes, 3-4 times a day, or as directed by your health care provider.  Avoid using the limb while the tendon is painful. Perform gentle range of motion exercises only as directed by your health care provider. Stop exercises if pain or discomfort increase, unless directed otherwise by your health care provider.  Only take over-the-counter or prescription medicines for pain, discomfort, or fever as directed by your health care provider. SEEK MEDICAL CARE IF:   Your pain and swelling increase.  You develop new, unexplained symptoms, especially increased numbness in the hands. MAKE SURE YOU:   Understand these instructions.  Will watch your condition.  Will get help right away if you are not doing well or get worse.   This information is not intended to replace advice given to you by your health care provider. Make sure you discuss any questions you have with your health care provider.   Document Released: 04/24/2000 Document Revised: 05/18/2014 Document Reviewed: 07/14/2010 Elsevier Interactive Patient Education 2016 [employer].  Tennis Elbow Tennis elbow (lateral epicondylitis) is inflammation of the outer tendons of your forearm close to your elbow. Your tendons attach your muscles to your bones. The outer tendons of your forearm are used to extend your wrist, and they attach on the outside part of your elbow. Tennis elbow is often  found in people who play tennis, but anyone may get the condition from repeatedly extending the wrist or turning the forearm. CAUSES This condition is caused by repeatedly extending your wrist and using your hands. It can result from sports or work that requires repetitive forearm movements. Tennis elbow may also be caused by an injury. RISK FACTORS You have a higher risk of developing tennis elbow if you play tennis or another racquet sport. You also have a higher risk if you frequently use your hands for work. This condition is also more likely to develop in:  Musicians.  Carpenters, painters, and plumbers.  Cooks.  Cashiers.  People who work in [employer].  Holiday representative workers.  Butchers.  People who use computers. SYMPTOMS Symptoms of this condition include:  Pain and tenderness in your forearm and the outer part of your elbow. You may only feel the pain when you use your arm, or you may feel it even when you are not using your arm.  A burning feeling that runs from your elbow through your arm.  Weak grip in your hands. DIAGNOSIS  This condition may be diagnosed by medical history and physical exam. You may also have other tests, including:  X-rays.  MRI. TREATMENT Your health care provider  will recommend lifestyle adjustments, such as resting and icing your arm. Treatment may also include:  Medicines for inflammation. This may include shots of cortisone if your pain continues.  Physical therapy. This may include massage or exercises.  An elbow brace. Surgery may eventually be recommended if your pain does not go away with treatment. HOME CARE INSTRUCTIONS Activity  Rest your elbow and wrist as directed by your health care provider. Try to avoid any activities that caused the problem until your health care provider says that you can do them again.  If a physical therapist teaches you exercises, do all of them as directed.  If you lift an object, lift it with  your palm facing upward. This lowers the stress on your elbow. Lifestyle  If your tennis elbow is caused by sports, check your equipment and make sure that:  You are using it correctly.  It is the best fit for you.  If your tennis elbow is caused by work, take breaks frequently, if you are able. Talk with your manager about how to best perform tasks in a way that is safe.  If your tennis elbow is caused by computer use, talk with your manager about any changes that can be made to your work environment. General Instructions  If directed, apply ice to the painful area:  Put ice in a plastic bag.  Place a towel between your skin and the bag.  Leave the ice on for 20 minutes, 2-3 times per day.  Take medicines only as directed by your health care provider.  If you were given a brace, wear it as directed by your health care provider.  Keep all follow-up visits as directed by your health care provider. This is important. SEEK MEDICAL CARE IF:  Your pain does not get better with treatment.  Your pain gets worse.  You have numbness or weakness in your forearm, hand, or fingers.   This information is not intended to replace advice given to you by your health care provider. Make sure you discuss any questions you have with your health care provider.   Document Released: 04/27/2005 Document Revised: 09/11/2014 Document Reviewed: 04/23/2014 Elsevier Interactive Patient Education [employer]2016 Elsevier Inc.

## 2015-05-27 NOTE — ED Provider Notes (Signed)
CSN: [id number]     Arrival date & time 05/27/15  1302 History   First MD Initiated Contact with Patient 05/27/15 1326     Chief Complaint  Patient presents with  . Arm Pain   (Consider location/radiation/quality/duration/timing/severity/associated sxs/prior Treatment) HPI Comments: 25 year old female complaining of pain, numbness and weakness to the right wrist and thumb occurring intermittently for approximately one week. She uses her right hand predominantly due to previous left wrist carpal tunnel syndrome and surgery. She is now experiencing some of the same symptoms to her right forearm and wrist. She states her grasp is weak. Pain with flexion and extension of the wrist and intermittent tingling and numbness to the digits. No known injury. No blunt trauma. 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   Past Medical History  Diagnosis Date  . Anxiety   . Depression   . PTSD (post-traumatic stress disorder)   . Bipolar depression (HCC)   . Asthma    Past Surgical History  Procedure Laterality Date  . Breast surgery    . Knee arthroscopy Left   . Tonsillectomy     No family history on file. Social History  Substance Use Topics  . Smoking status: Never Smoker   . Smokeless tobacco: None  . Alcohol Use: No   OB History    No data available     Review of Systems  Constitutional: Positive for activity change. Negative for fever, chills and fatigue.  HENT: Negative.   Respiratory: Negative.   Cardiovascular: Negative.   Gastrointestinal: Negative.   Musculoskeletal: Negative for back pain.       As per HPI  Skin: Negative for color change, pallor and rash.  Neurological: Positive for numbness.    Allergies  Review of patient's allergies indicates no known allergies.  Home Medications   Prior to Admission medications   Medication Sig Start Date End Date Taking? Authorizing Provider  albuterol (PROVENTIL HFA;VENTOLIN HFA)  108 (90 BASE) MCG/ACT inhaler Inhale 2 puffs into the lungs every 6 (six) hours as needed for wheezing or shortness of breath.    Historical Provider, MD  ciprofloxacin-dexamethasone (CIPRODEX) otic suspension Place 4 drops into the right ear 2 (two) times daily. 02/27/15   Renne Crigler, PA-C  doxycycline (VIBRAMYCIN) 100 MG capsule Take 1 capsule (100 mg total) by mouth 2 (two) times daily. 01/24/15   Linna Hoff, MD  Etonogestrel [facility]) Inject into the skin once.    Historical Provider, MD  hydrOXYzine (ATARAX/VISTARIL) 50 MG tablet Take 100 mg by mouth at bedtime.    Historical Provider, MD  ipratropium (ATROVENT) 0.06 % nasal spray Place 2 sprays into both nostrils 4 (four) times daily. 01/24/15   Linna Hoff, MD  naproxen (NAPROSYN) 375 MG tablet Take 1 tablet (375 mg total) by mouth 2 (two) times daily. 05/27/15   Hayden Rasmussen, NP  phenazopyridine (PYRIDIUM) 200 MG tablet Take 1 tablet (200 mg total) by mouth 3 (three) times daily. 11/02/14   Junius Finner, PA-C  pseudoephedrine (SUDAFED) 60 MG tablet Take 1 tablet (60 mg total) by mouth every 6 (six) hours as needed for congestion. 02/27/15   Renne Crigler, PA-C   Meds Ordered and Administered this Visit  Medications - No data to display  BP 109/79 mmHg  Pulse 79  Temp(Src) 97.7 F (36.5 C) (Oral)  Resp 16  SpO2 98% No data found.   Physical Exam  Constitutional: She appears well-developed and  well-nourished. No distress.  Eyes: EOM are normal.  Neck: Normal range of motion. Neck supple.  Cardiovascular: Normal rate.   Pulmonary/Chest: Effort normal. No respiratory distress.  Musculoskeletal: She exhibits tenderness. She exhibits no edema.  There is tenderness over the volar wrist. Positive Tinel's and positive Phalen's. Grasp is weak. Distal vascular and motor is intact. Tenderness over the right lateral epicondyles. Radial pulse 2+. Capillary refill brisk.  Neurological: She is alert. No cranial nerve deficit. She  exhibits normal muscle tone.  Skin: Skin is warm and dry.  Psychiatric: She has a normal mood and affect.  Nursing note and vitals reviewed.   ED Course  Procedures (including critical care time)  Labs Review Labs Reviewed - No data to display  Imaging Review No results found.   Visual Acuity Review  Right Eye Distance:   Left Eye Distance:   Bilateral Distance:    Right Eye Near:   Left Eye Near:    Bilateral Near:         MDM   1. Carpal tunnel syndrome of right wrist   2. Tendinitis of elbow or forearm   3. Lateral epicondylitis (tennis elbow), right    Right wrist splint Ice, rest right arm, wrist Naprosyn for pain F/u with ortho    Hayden Rasmussenavid Lamere Lightner, NP 05/27/15 1351  Hayden Rasmussenavid Shirley Bolle, NP 05/27/15 1352

## 2015-06-05 ENCOUNTER — Ambulatory Visit: Payer: Self-pay

## 2015-06-17 ENCOUNTER — Institutional Professional Consult (permissible substitution): Payer: Self-pay | Admitting: Internal Medicine

## 2015-07-23 ENCOUNTER — Inpatient Hospital Stay (HOSPITAL_COMMUNITY)
Admission: AD | Admit: 2015-07-23 | Discharge: 2015-07-23 | Disposition: A | Discharge status: Home / Self Care | Payer: Self-pay | Source: Ambulatory Visit | Attending: Obstetrics & Gynecology | Admitting: Obstetrics & Gynecology

## 2015-07-23 ENCOUNTER — Encounter (HOSPITAL_COMMUNITY): Payer: Self-pay | Admitting: *Deleted

## 2015-07-23 DIAGNOSIS — J45909 Unspecified asthma, uncomplicated: Secondary | ICD-10-CM | POA: Insufficient documentation

## 2015-07-23 DIAGNOSIS — N939 Abnormal uterine and vaginal bleeding, unspecified: Secondary | ICD-10-CM | POA: Insufficient documentation

## 2015-07-23 DIAGNOSIS — F4310 Post-traumatic stress disorder, unspecified: Secondary | ICD-10-CM | POA: Insufficient documentation

## 2015-07-23 DIAGNOSIS — N921 Excessive and frequent menstruation with irregular cycle: Secondary | ICD-10-CM

## 2015-07-23 DIAGNOSIS — Z87891 Personal history of nicotine dependence: Secondary | ICD-10-CM | POA: Insufficient documentation

## 2015-07-23 DIAGNOSIS — F419 Anxiety disorder, unspecified: Secondary | ICD-10-CM | POA: Insufficient documentation

## 2015-07-23 DIAGNOSIS — M549 Dorsalgia, unspecified: Secondary | ICD-10-CM | POA: Insufficient documentation

## 2015-07-23 DIAGNOSIS — F319 Bipolar disorder, unspecified: Secondary | ICD-10-CM | POA: Insufficient documentation

## 2015-07-23 LAB — URINALYSIS, ROUTINE W REFLEX MICROSCOPIC
Bilirubin Urine: NEGATIVE
GLUCOSE, UA: NEGATIVE mg/dL
KETONES UR: NEGATIVE mg/dL
Leukocytes, UA: NEGATIVE
Nitrite: NEGATIVE
PH: 5.5 (ref 5.0–8.0)
Protein, ur: NEGATIVE mg/dL
Specific Gravity, Urine: 1.025 (ref 1.005–1.030)

## 2015-07-23 LAB — WET PREP, GENITAL
SPERM: NONE SEEN
TRICH WET PREP: NONE SEEN
YEAST WET PREP: NONE SEEN

## 2015-07-23 LAB — URINE MICROSCOPIC-ADD ON: WBC UA: NONE SEEN WBC/hpf (ref 0–5)

## 2015-07-23 LAB — POCT PREGNANCY, URINE: Preg Test, Ur: NEGATIVE

## 2015-07-23 MED ORDER — IBUPROFEN 800 MG PO TABS
800.0000 mg | ORAL_TABLET | Freq: Once | ORAL | Status: AC
  Administered 2015-07-23: 800 mg via ORAL
  Filled 2015-07-23: qty 1

## 2015-07-23 MED ORDER — IBUPROFEN 800 MG PO TABS: 800.0000 mg | ORAL_TABLET | Freq: Four times a day (QID) | ORAL | Status: AC | PRN

## 2015-07-23 NOTE — Discharge Instructions (Signed)
Abnormal Uterine Bleeding °Abnormal uterine bleeding means bleeding from the vagina that is not your normal menstrual period. This can be: °· Bleeding or spotting between periods. °· Bleeding after sex (sexual intercourse). °· Bleeding that is heavier or more than normal. °· Periods that last longer than usual. °· Bleeding after menopause. °There are many problems that may cause this. Treatment will depend on the cause of the bleeding. Any kind of bleeding that is not normal should be reviewed by your doctor.  °HOME CARE °Watch your condition for any changes. These actions may lessen any discomfort you are having: °· Do not use tampons or douches as told by your doctor. °· Change your pads often. °You should get regular pelvic exams and Pap tests. Keep all appointments for tests as told by your doctor. °GET HELP IF: °· You are bleeding for more than 1 week. °· You feel dizzy at times. °GET HELP RIGHT AWAY IF:  °· You pass out. °· You have to change pads every 15 to 30 minutes. °· You have belly pain. °· You have a fever. °· You become sweaty or weak. °· You are passing large blood clots from the vagina. °· You feel sick to your stomach (nauseous) and throw up (vomit). °MAKE SURE YOU: °· Understand these instructions. °· Will watch your condition. °· Will get help right away if you are not doing well or get worse. °  °This information is not intended to replace advice given to you by your health care provider. Make sure you discuss any questions you have with your health care provider. °  °Document Released: 02/22/2009 Document Revised: 05/02/2013 Document Reviewed: 11/24/2012 °Elsevier Interactive Patient Education ©2016 [employer] ° °

## 2015-07-23 NOTE — MAU Provider Note (Signed)
History    CSN: [id number] Arrival date and time: 07/23/15 1015 First Provider Initiated Contact with Patient 07/23/15 1110    Chief Complaint  Patient presents with  . Vaginal Bleeding  . Back Pain   HPI Maxwell Martorano is a 25 y.o. presenting with prolonged vaginal bleeding and back pain.  Reports normal menses 3/3-3/8 but then starting bleeding heavily at 1200 yesterday 3/13, reports using 2 pads per hour since that time. Reports HA for 1 week and dizziness/lightheadedness today. She has a nexplanon for birth control. This is her 3rd Nexplanon One partner, sexually active  Reports associated vaginal odor- "very bad" Reports pain in her back, tried pamparin last night which did not help.   Pertinent Gynecological History: Menses: every 3 months typically  Bleeding: intermenstrual bleeding Contraception: Nexplanon DES exposure: denies Blood transfusions: none Sexually transmitted diseases:  PID 7 years ago Previous GYN Procedures: none  Last pap: abnormal: unsure of what they said Date: July 2016   Past Medical History  Diagnosis Date  . Anxiety   . Depression   . PTSD (post-traumatic stress disorder)   . Bipolar depression (HCC)   . Asthma     Past Surgical History  Procedure Laterality Date  . Breast surgery    . Knee arthroscopy Left   . Tonsillectomy      History reviewed. No pertinent family history.  Social History  Substance Use Topics  . Smoking status: Former Smoker    Types: Cigarettes  . Smokeless tobacco: None  . Alcohol Use: Yes     Comment: occasional beer    Allergies: No Known Allergies  Prescriptions prior to admission  Medication Sig Dispense Refill Last Dose  . albuterol (PROVENTIL HFA;VENTOLIN HFA) 108 (90 BASE) MCG/ACT inhaler Inhale 2 puffs into the lungs every 6 (six) hours as needed for wheezing or shortness of breath.   rescue  . Etonogestrel (IMPLANON [place]) Inject into the skin once.   continuous  . Melatonin 5 MG TABS Take 1  tablet by mouth at bedtime.   07/22/2015 at Unknown time  . naproxen sodium (ALEVE) 220 MG tablet Take 660 mg by mouth daily as needed (for pain).   07/22/2015 at Unknown time  . ipratropium (ATROVENT) 0.06 % nasal spray Place 2 sprays into both nostrils 4 (four) times daily. (Patient not taking: Reported on 07/23/2015) 15 mL 12 Completed Course at Unknown time  . pseudoephedrine (SUDAFED) 60 MG tablet Take 1 tablet (60 mg total) by mouth every 6 (six) hours as needed for congestion. (Patient not taking: Reported on 07/23/2015) 30 tablet 0 Completed Course at Unknown time    Review of Systems  Constitutional: Negative for fever and chills.  Eyes: Negative for blurred vision and double vision.  Respiratory: Negative for cough and shortness of breath.   Cardiovascular: Negative for chest pain and orthopnea.  Gastrointestinal: Negative for nausea and vomiting.  Genitourinary: Negative for dysuria, frequency and flank pain.  Musculoskeletal: Negative for myalgias.  Skin: Negative for rash.  Neurological: Negative for dizziness, tingling, weakness and headaches.  Endo/Heme/Allergies: Does not bruise/bleed easily.  Psychiatric/Behavioral: Negative for depression and suicidal ideas. The patient is not nervous/anxious.    Physical Exam   Blood pressure 108/54, pulse 71, temperature 100.1 F (37.8 C), temperature source Oral, resp. rate 18, height  (1.702 m), weight 314 lb (142.429 kg), last menstrual period 07/12/2015, SpO2 95 %.  Physical Exam  Nursing note and vitals reviewed. Constitutional: She is oriented to person, place, and time.  She appears well-developed and well-nourished. No distress.  HENT:  Head: Normocephalic and atraumatic.  Eyes: Conjunctivae are normal. No scleral icterus.  Neck: Normal range of motion. Neck supple.  Cardiovascular: Normal rate and intact distal pulses.   Respiratory: Effort normal. She exhibits no tenderness.  GI: Soft. There is tenderness (TTP  suprapubic). There is no rebound and no guarding.  Genitourinary: Vagina normal.  Min to mod blood in the vaginal vault. No purulent discharge from cervix. No CMT but TTP over suprapubic region.   Musculoskeletal: Normal range of motion. She exhibits no edema.  Neurological: She is alert and oriented to person, place, and time.  Skin: Skin is warm and dry. No rash noted.  Psychiatric: She has a normal mood and affect.    MAU Course  Procedures Pelvic exam MDM  Recent Results (from the past 2160 hour(s))  Urinalysis, Routine w reflex microscopic (not [facility])     Status: Abnormal   Collection Time: 07/23/15 10:25 AM  Result Value Ref Range   Color, Urine YELLOW YELLOW   APPearance CLEAR CLEAR   Specific Gravity, Urine 1.025 1.005 - 1.030   pH 5.5 5.0 - 8.0   Glucose, UA NEGATIVE NEGATIVE mg/dL   Hgb urine dipstick LARGE (A) NEGATIVE   Bilirubin Urine NEGATIVE NEGATIVE   Ketones, ur NEGATIVE NEGATIVE mg/dL   Protein, ur NEGATIVE NEGATIVE mg/dL   Nitrite NEGATIVE NEGATIVE   Leukocytes, UA NEGATIVE NEGATIVE  Urine microscopic-add on     Status: Abnormal   Collection Time: 07/23/15 10:25 AM  Result Value Ref Range   Squamous Epithelial / LPF 0-5 (A) NONE SEEN   WBC, UA NONE SEEN 0 - 5 WBC/hpf   RBC / HPF 0-5 0 - 5 RBC/hpf   Bacteria, UA RARE (A) NONE SEEN  Pregnancy, urine POC     Status: None   Collection Time: 07/23/15 10:44 AM  Result Value Ref Range   Preg Test, Ur NEGATIVE NEGATIVE    Comment:        THE SENSITIVITY OF THIS METHODOLOGY IS >24 mIU/mL   Wet prep, genital     Status: Abnormal   Collection Time: 07/23/15 11:40 AM  Result Value Ref Range   Yeast Wet Prep HPF POC NONE SEEN NONE SEEN   Trich, Wet Prep NONE SEEN NONE SEEN   Clue Cells Wet Prep HPF POC FEW (A) NONE SEEN   WBC, Wet Prep HPF POC FEW (A) NONE SEEN    Comment: FEW BACTERIA SEEN   Sperm NONE SEEN    Assessment and Plan   #AUB - likely normal variant - trial of scheduled NSAIDS - return to  her PCP if not improved  Federico FlakeKimberly Niles Newton 07/23/2015, 11:10 AM

## 2015-07-23 NOTE — MAU Note (Signed)
Patient states her LMP was 3/6 finished on 3/11, but started having bleeding again yesterday and increased abdominal and back pain.  Reports foul smelling blood, but no discharge.  Patient has implant for birth control that was placed in July.

## 2015-07-24 LAB — GC/CHLAMYDIA PROBE AMP (~~LOC~~) NOT AT ARMC
Chlamydia: NEGATIVE
NEISSERIA GONORRHEA: NEGATIVE

## 2015-09-24 ENCOUNTER — Ambulatory Visit (HOSPITAL_COMMUNITY)
Admission: EM | Admit: 2015-09-24 | Discharge: 2015-09-24 | Disposition: A | Discharge status: Home / Self Care | Payer: Self-pay | Attending: Family Medicine | Admitting: Family Medicine

## 2015-09-24 ENCOUNTER — Encounter (HOSPITAL_COMMUNITY): Payer: Self-pay | Admitting: *Deleted

## 2015-09-24 DIAGNOSIS — M7652 Patellar tendinitis, left knee: Secondary | ICD-10-CM

## 2015-09-24 MED ORDER — MELOXICAM 7.5 MG PO TABS: 7.5000 mg | ORAL_TABLET | Freq: Two times a day (BID) | ORAL | Status: DC

## 2015-09-24 NOTE — ED Provider Notes (Signed)
CSN: [id number]     Arrival date & time 09/24/15  1306 History   First MD Initiated Contact with Patient 09/24/15 1321     Chief Complaint  Patient presents with  . Knee Pain   (Consider location/radiation/quality/duration/timing/severity/associated sxs/prior Treatment) Patient is a 25 y.o. female presenting with knee pain. The history is provided by the patient.  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   Past Medical History  Diagnosis Date  . Anxiety   . Depression   . PTSD (post-traumatic stress disorder)   . Bipolar depression (HCC)   . Asthma    Past Surgical History  Procedure Laterality Date  . Breast surgery    . Knee arthroscopy Left   . Tonsillectomy     History reviewed. No pertinent family history. Social History  Substance Use Topics  . Smoking status: Former Smoker    Types: Cigarettes  . Smokeless tobacco: None  . Alcohol Use: Yes     Comment: occasional beer   OB History    Gravida Para Term Preterm AB TAB SAB Ectopic Multiple Living       Review of Systems  Constitutional: Negative.  Negative for fever.  Musculoskeletal: Positive for gait problem and stiffness. Negative for myalgias, back pain and joint swelling.  Skin: Negative.   All other systems reviewed and are negative.   Allergies  Review of patient's allergies indicates  no known allergies.  Home Medications   Prior to Admission medications   Medication Sig Start Date End Date Taking? Authorizing Provider  albuterol (PROVENTIL HFA;VENTOLIN HFA) 108 (90 BASE) MCG/ACT inhaler Inhale 2 puffs into the lungs every 6 (six) hours as needed for wheezing or shortness of breath.    Historical Provider, MD  Etonogestrel (IMPLANON [place]) Inject into the skin once.    Historical Provider, MD  ipratropium (ATROVENT) 0.06 % nasal spray Place 2 sprays into both nostrils 4 (four) times daily. Patient not taking: Reported on 07/23/2015 01/24/15   Linna Hoff, MD  Melatonin 5 MG TABS Take 1 tablet by mouth at bedtime.    Historical Provider, MD  meloxicam (MOBIC) 7.5 MG tablet Take 1 tablet (7.5 mg total) by mouth 2 (two) times daily after a meal. 09/24/15   Linna Hoff, MD  pseudoephedrine (SUDAFED) 60 MG tablet Take 1 tablet (60 mg total) by mouth every 6 (six) hours as needed for congestion. Patient not taking: Reported on 07/23/2015 02/27/15   Renne Crigler, PA-C   Meds Ordered and Administered this Visit  Medications - No data to display  BP   Pulse 69  Temp(Src) 98.3 F (36.8 C) (Oral)  Resp 18  SpO2 98%  LMP 08/26/2015 No data found.   Physical Exam  Constitutional:  She is oriented to person, place, and time. She appears well-developed and well-nourished. She appears distressed.  Musculoskeletal: Normal range of motion. She exhibits tenderness.       Left knee: She exhibits abnormal patellar mobility. She exhibits normal range of motion, no swelling, no effusion, no deformity, normal alignment, no LCL laxity, no bony tenderness, normal meniscus and no MCL laxity. Tenderness found. Lateral joint line and patellar tendon tenderness noted.  Neurological: She is alert and oriented to person, place, and time.  Skin: Skin is warm and dry.  Nursing note and vitals reviewed.   ED Course  Procedures (including critical care time)  Labs Review Labs Reviewed - No data  to display  Imaging Review No results found.   Visual Acuity Review  Right Eye Distance:   Left Eye Distance:   Bilateral Distance:    Right Eye Near:   Left Eye Near:    Bilateral Near:         MDM   1. Patellar tendonitis of left knee        Toy Eisemann D KindlLinna Hoff, MD 09/24/15 [phone]

## 2015-09-24 NOTE — Discharge Instructions (Signed)
Ice, ace and medicine and see orthopedist if further problems.

## 2015-09-24 NOTE — ED Notes (Signed)
Pt      Reports       Pain    l  Knee            X  4  Days      denys   Any  specefic  Injury         X    3  Days         Pt reports  The  Symptoms  Not  releived  By  Aspirin         Rest       pt  Reports  The pain is  Burning in  Nature     And  radiates  Up the      l  Leg       Pt  denys  Any   specefic  Injury

## 2015-11-01 ENCOUNTER — Inpatient Hospital Stay (HOSPITAL_COMMUNITY): Payer: Self-pay

## 2015-11-01 ENCOUNTER — Encounter (HOSPITAL_COMMUNITY): Payer: Self-pay | Admitting: *Deleted

## 2015-11-01 ENCOUNTER — Inpatient Hospital Stay (HOSPITAL_COMMUNITY)
Admission: AD | Admit: 2015-11-01 | Discharge: 2015-11-01 | Disposition: A | Discharge status: Home / Self Care | Payer: Self-pay | Source: Ambulatory Visit | Attending: Family Medicine | Admitting: Family Medicine

## 2015-11-01 DIAGNOSIS — N3000 Acute cystitis without hematuria: Secondary | ICD-10-CM

## 2015-11-01 DIAGNOSIS — N939 Abnormal uterine and vaginal bleeding, unspecified: Secondary | ICD-10-CM

## 2015-11-01 DIAGNOSIS — F319 Bipolar disorder, unspecified: Secondary | ICD-10-CM | POA: Insufficient documentation

## 2015-11-01 DIAGNOSIS — Z87891 Personal history of nicotine dependence: Secondary | ICD-10-CM | POA: Insufficient documentation

## 2015-11-01 DIAGNOSIS — R102 Pelvic and perineal pain unspecified side: Secondary | ICD-10-CM

## 2015-11-01 DIAGNOSIS — J45909 Unspecified asthma, uncomplicated: Secondary | ICD-10-CM | POA: Insufficient documentation

## 2015-11-01 DIAGNOSIS — Z79899 Other long term (current) drug therapy: Secondary | ICD-10-CM | POA: Insufficient documentation

## 2015-11-01 DIAGNOSIS — R109 Unspecified abdominal pain: Secondary | ICD-10-CM | POA: Insufficient documentation

## 2015-11-01 DIAGNOSIS — F4310 Post-traumatic stress disorder, unspecified: Secondary | ICD-10-CM | POA: Insufficient documentation

## 2015-11-01 LAB — URINALYSIS, ROUTINE W REFLEX MICROSCOPIC
BILIRUBIN URINE: NEGATIVE
GLUCOSE, UA: NEGATIVE mg/dL
KETONES UR: NEGATIVE mg/dL
Nitrite: NEGATIVE
PH: 6.5 (ref 5.0–8.0)
Protein, ur: 30 mg/dL — AB
Specific Gravity, Urine: 1.02 (ref 1.005–1.030)

## 2015-11-01 LAB — CBC
HCT: 37.9 % (ref 36.0–46.0)
Hemoglobin: 12.8 g/dL (ref 12.0–15.0)
MCH: 29.3 pg (ref 26.0–34.0)
MCHC: 33.8 g/dL (ref 30.0–36.0)
MCV: 86.7 fL (ref 78.0–100.0)
PLATELETS: 290 10*3/uL (ref 150–400)
RBC: 4.37 MIL/uL (ref 3.87–5.11)
RDW: 12.7 % (ref 11.5–15.5)
WBC: 8 10*3/uL (ref 4.0–10.5)

## 2015-11-01 LAB — URINE MICROSCOPIC-ADD ON

## 2015-11-01 LAB — WET PREP, GENITAL
Sperm: NONE SEEN
TRICH WET PREP: NONE SEEN
Yeast Wet Prep HPF POC: NONE SEEN

## 2015-11-01 LAB — POCT PREGNANCY, URINE: Preg Test, Ur: NEGATIVE

## 2015-11-01 MED ORDER — PHENAZOPYRIDINE HCL 200 MG PO TABS: 200.0000 mg | ORAL_TABLET | Freq: Three times a day (TID) | ORAL | Status: DC

## 2015-11-01 MED ORDER — KETOROLAC TROMETHAMINE 60 MG/2ML IM SOLN
60.0000 mg | Freq: Once | INTRAMUSCULAR | Status: AC
  Administered 2015-11-01: 60 mg via INTRAMUSCULAR
  Filled 2015-11-01: qty 2

## 2015-11-01 MED ORDER — SULFAMETHOXAZOLE-TRIMETHOPRIM 800-160 MG PO TABS: 1.0000 | ORAL_TABLET | Freq: Two times a day (BID) | ORAL | Status: AC

## 2015-11-01 NOTE — MAU Note (Signed)
Patient presents with having her period for 2 weeks, was seen in May for the same and told to take Ibuprofen, went away for a while, tired, doesn't feel good. Stopped bleeding on 6/13, last night started back.

## 2015-11-01 NOTE — MAU Provider Note (Signed)
CSN: [id number]     Arrival date & time 11/01/15  1547 History   None    Chief Complaint  Patient presents with  . Amenorrhea     (Consider location/radiation/quality/duration/timing/severity/associated sxs/prior Treatment) Abdominal Pain This is a new problem. The current episode started yesterday. The onset quality is gradual. The problem occurs constantly. The problem has been gradually worsening. The pain is located in the suprapubic region. The pain is at a severity of 7/10. The quality of the pain is sharp. The abdominal pain radiates to the back. Associated symptoms include dysuria, frequency, myalgias, nausea and vomiting. Pertinent negatives include no fever. The pain is relieved by nothing. Treatments tried: Aleve. The treatment provided mild relief.   Kaitlyn Luna is a 25 y.o. G0 who presents to the MAU with abdominal pain and vaginal bleeding. LNMP unsure. Patient reports that she had bleeding 6/11- 6/14 and then stopped and started back today. She is using implant for birth control and has had x 1 year. Sexually active with one partner x 1 year. Hx of PID 2012. Patient reports feeling really tired and not feeling well. Patient unsure if she has had fever.  Past Medical History  Diagnosis Date  . Anxiety   . Depression   . PTSD (post-traumatic stress disorder)   . Bipolar depression (HCC)   . Asthma    Past Surgical History  Procedure Laterality Date  . Breast surgery    . Knee arthroscopy Left   . Tonsillectomy     History reviewed. No pertinent family history. Social History  Substance Use Topics  . Smoking status: Former Smoker    Types: Cigarettes  . Smokeless tobacco: None  . Alcohol Use: Yes     Comment: occasional beer   OB History    Gravida Para Term Preterm AB TAB SAB Ectopic Multiple Living       Review of Systems  Constitutional: Negative for fever.  Gastrointestinal: Positive for nausea, vomiting and abdominal pain.   Genitourinary: Positive for dysuria and frequency.  Musculoskeletal: Positive for myalgias.  all other systems negative    Allergies  Review of patient's allergies indicates no known allergies.  Home Medications   Prior to Admission medications   Medication Sig Start Date End Date Taking? Authorizing Provider  ALPRAZolam Prudy Feeler) 0.5 MG tablet Take 0.5 mg by mouth at bedtime as needed for anxiety.   Yes Historical Provider, MD  amphetamine-dextroamphetamine (ADDERALL) 5 MG tablet Take 5 mg by mouth daily.   Yes Historical Provider, MD  Etonogestrel (IMPLANON [place]) Inject into the skin once.   Yes Historical Provider, MD  ibuprofen (ADVIL,MOTRIN) 200 MG tablet Take 400 mg by mouth every 6 (six) hours as needed.   Yes Historical Provider, MD  albuterol (PROVENTIL HFA;VENTOLIN HFA) 108 (90 BASE) MCG/ACT inhaler Inhale 2 puffs into the lungs every 6 (six) hours as needed for wheezing or shortness of breath.    Historical Provider, MD  ipratropium (ATROVENT) 0.06 % nasal spray Place 2 sprays into both nostrils 4 (four) times daily. Patient not taking: Reported on 07/23/2015 01/24/15   Linna Hoff, MD  meloxicam (MOBIC) 7.5 MG tablet Take 1 tablet (7.5 mg total) by mouth 2 (two) times daily after a meal. Patient not taking: Reported on 11/01/2015 09/24/15   Linna Hoff, MD  phenazopyridine (PYRIDIUM) 200 MG tablet Take 1 tablet (200 mg total) by mouth 3 (three) times daily. 11/01/15   Elveria Lauderbaugh  Orlene OchM Kendelle Schweers, NP  pseudoephedrine (SUDAFED) 60 MG tablet Take 1 tablet (60 mg total) by mouth every 6 (six) hours as needed for congestion. Patient not taking: Reported on 07/23/2015 02/27/15   Renne CriglerJoshua Geiple, PA-C  sulfamethoxazole-trimethoprim (BACTRIM DS,SEPTRA DS) 800-160 MG tablet Take 1 tablet by mouth 2 (two) times daily. 11/01/15 11/08/15  Ares Tegtmeyer Orlene OchM Aryanna Shaver, NP   BP 105/47 mmHg  Pulse 69  Temp(Src) 98.4 F (36.9 C) (Oral)  Resp 16  Ht 5\' 6"  (1.676 m)  Wt 310 lb 1.9 oz (140.67 kg)  BMI 50.08 kg/m2  LMP  10/16/2015 Physical Exam  Constitutional: She is oriented to person, place, and time. She appears well-developed and well-nourished. No distress.  HENT:  Head: Normocephalic and atraumatic.  Eyes: Conjunctivae and EOM are normal.  Neck: Neck supple.  Cardiovascular: Normal rate.   Pulmonary/Chest: Effort normal.  Abdominal: Soft. There is tenderness in the suprapubic area. There is no rigidity, no rebound, no guarding and no CVA tenderness.  Genitourinary:  External genitalia without lesions, small blood vaginal vault. Positive CMT, bilateral adnexal tenderness, uterus without palpable enlargement.   Musculoskeletal: Normal range of motion.  Neurological: She is alert and oriented to person, place, and time. No cranial nerve deficit.  Skin: Skin is warm and dry.  Psychiatric: She has a normal mood and affect. Her behavior is normal.  Nursing note and vitals reviewed.   ED Course  Procedures (including critical care time) Labs Review Results for orders placed or performed during the hospital encounter of 11/01/15 (from the past 24 hour(s))  Urinalysis, Routine w reflex microscopic (not at [facility])     Status: Abnormal   Collection Time: 11/01/15  4:00 PM  Result Value Ref Range   Color, Urine AMBER (A) YELLOW   APPearance HAZY (A) CLEAR   Specific Gravity, Urine 1.020 1.005 - 1.030   pH 6.5 5.0 - 8.0   Glucose, UA NEGATIVE NEGATIVE mg/dL   Hgb urine dipstick LARGE (A) NEGATIVE   Bilirubin Urine NEGATIVE NEGATIVE   Ketones, ur NEGATIVE NEGATIVE mg/dL   Protein, ur 30 (A) NEGATIVE mg/dL   Nitrite NEGATIVE NEGATIVE   Leukocytes, UA SMALL (A) NEGATIVE  Urine microscopic-add on     Status: Abnormal   Collection Time: 11/01/15  4:00 PM  Result Value Ref Range   Squamous Epithelial / LPF 0-5 (A) NONE SEEN   WBC, UA 0-5 0 - 5 WBC/hpf   RBC / HPF 0-5 0 - 5 RBC/hpf   Bacteria, UA FEW (A) NONE SEEN  Pregnancy, urine POC     Status: None   Collection Time: 11/01/15  4:05 PM  Result  Value Ref Range   Preg Test, Ur NEGATIVE NEGATIVE  CBC     Status: None   Collection Time: 11/01/15  4:27 PM  Result Value Ref Range   WBC 8.0 4.0 - 10.5 K/uL   RBC 4.37 3.87 - 5.11 MIL/uL   Hemoglobin 12.8 12.0 - 15.0 g/dL   HCT 16.137.9 09.636.0 - 04.546.0 %   MCV 86.7 78.0 - 100.0 fL   MCH 29.3 26.0 - 34.0 pg   MCHC 33.8 30.0 - 36.0 g/dL   RDW 40.912.7 81.111.5 - 91.415.5 %   Platelets 290 150 - 400 K/uL  Wet prep, genital     Status: Abnormal   Collection Time: 11/01/15  5:43 PM  Result Value Ref Range   Yeast Wet Prep HPF POC NONE SEEN NONE SEEN   Trich, Wet Prep NONE SEEN NONE SEEN  Clue Cells Wet Prep HPF POC PRESENT (A) NONE SEEN   WBC, Wet Prep HPF POC MODERATE (A) NONE SEEN   Sperm NONE SEEN     Imaging Review [place] Transvaginal Non-ob  11/01/2015  CLINICAL DATA:  Acute onset of abnormal vaginal bleeding. Initial encounter. EXAM: TRANSABDOMINAL AND TRANSVAGINAL ULTRASOUND OF PELVIS TECHNIQUE: Both transabdominal and transvaginal ultrasound examinations of the pelvis were performed. Transabdominal technique was performed for global imaging of the pelvis including uterus, adnexal regions, and pelvic cul-de-sac. It was necessary to proceed with endovaginal exam following the transabdominal exam to visualize the uterus and adnexa in greater detail. COMPARISON:  CT of the abdomen and pelvis from 07/29/2013 FINDINGS: Uterus Measurements: 7.3 x 3.8 x 3.9 cm. No fibroids or other mass visualized. Endometrium Thickness: 0.6 cm.  Not well characterized. Right ovary Not visualized. Left ovary Not visualized. Other findings No abnormal free fluid. IMPRESSION: Unremarkable pelvic ultrasound. The uterus is grossly unremarkable in appearance. The ovaries are not visualized on this study. Evaluation is suboptimal due to the patient's habitus. Electronically Signed   By: Roanna RaiderJeffery  Chang M.D.   On: 11/01/2015 19:41   [place] Pelvis Complete  11/01/2015  CLINICAL DATA:  Acute onset of abnormal vaginal bleeding. Initial  encounter. EXAM: TRANSABDOMINAL AND TRANSVAGINAL ULTRASOUND OF PELVIS TECHNIQUE: Both transabdominal and transvaginal ultrasound examinations of the pelvis were performed. Transabdominal technique was performed for global imaging of the pelvis including uterus, adnexal regions, and pelvic cul-de-sac. It was necessary to proceed with endovaginal exam following the transabdominal exam to visualize the uterus and adnexa in greater detail. COMPARISON:  CT of the abdomen and pelvis from 07/29/2013 FINDINGS: Uterus Measurements: 7.3 x 3.8 x 3.9 cm. No fibroids or other mass visualized. Endometrium Thickness: 0.6 cm.  Not well characterized. Right ovary Not visualized. Left ovary Not visualized. Other findings No abnormal free fluid. IMPRESSION: Unremarkable pelvic ultrasound. The uterus is grossly unremarkable in appearance. The ovaries are not visualized on this study. Evaluation is suboptimal due to the patient's habitus. Electronically Signed   By: Roanna RaiderJeffery  Chang M.D.   On: 11/01/2015 19:41   I have personally reviewed and evaluated these images and lab results as part of my medical decision-making.  Blood in urine most likely due to vaginal bleeding.  MDM  25 y.o. female with pelvic pain stable for d/c without fever and does not appear toxic. Will treat for UTI symptoms and she will f/u in the GYN Clinic. Discussed with the patient plan of care and all questioned fully answered. She will return here if any problems arise.   Final diagnoses:  Abnormal vaginal bleeding  Acute cystitis without hematuria

## 2015-11-01 NOTE — Discharge Instructions (Signed)
Your bleeding may be due to the implant you have for birth control. Follow up with the GYN Clinic.

## 2015-11-01 NOTE — MAU Note (Signed)
Pt. Here for back pain. LMP 10/16/15 and this lasted 5 days. Stopped bleeding around 6/13 and bleeding started again last night 6/22. This happened last month also. Feels more tired and has back pain. Denies clots. Pt. States she is nauseated and appetite has decreased. Unsure if pregnant. States she uses protection and has intercourse once a month. Has not taken a pregnancy test at home.

## 2015-11-04 LAB — GC/CHLAMYDIA PROBE AMP (~~LOC~~) NOT AT ARMC
Chlamydia: POSITIVE — AB
NEISSERIA GONORRHEA: NEGATIVE

## 2015-11-07 ENCOUNTER — Telehealth (HOSPITAL_COMMUNITY): Payer: Self-pay | Admitting: *Deleted

## 2015-11-07 DIAGNOSIS — A749 Chlamydial infection, unspecified: Secondary | ICD-10-CM

## 2015-11-07 MED ORDER — AZITHROMYCIN 500 MG PO TABS: ORAL_TABLET | ORAL | Status: DC

## 2015-11-07 NOTE — Telephone Encounter (Signed)
Telephone call to patient regarding positive chlamydia culture, patient notified.  Rx routed to pharmacy per protocol.  Instructed patient to notify her partner for treatment and to abstain from sex for seven days post treatment.  Report faxed to health department.  

## 2016-01-07 ENCOUNTER — Emergency Department (HOSPITAL_COMMUNITY)
Admission: EM | Admit: 2016-01-07 | Discharge: 2016-01-07 | Disposition: A | Discharge status: Home / Self Care | Payer: Self-pay | Attending: Emergency Medicine | Admitting: Emergency Medicine

## 2016-01-07 ENCOUNTER — Encounter (HOSPITAL_COMMUNITY): Payer: Self-pay | Admitting: Emergency Medicine

## 2016-01-07 ENCOUNTER — Emergency Department (HOSPITAL_COMMUNITY): Payer: Self-pay

## 2016-01-07 DIAGNOSIS — J45909 Unspecified asthma, uncomplicated: Secondary | ICD-10-CM | POA: Insufficient documentation

## 2016-01-07 DIAGNOSIS — M2540 Effusion, unspecified joint: Secondary | ICD-10-CM | POA: Insufficient documentation

## 2016-01-07 DIAGNOSIS — Z791 Long term (current) use of non-steroidal anti-inflammatories (NSAID): Secondary | ICD-10-CM | POA: Insufficient documentation

## 2016-01-07 DIAGNOSIS — Z79899 Other long term (current) drug therapy: Secondary | ICD-10-CM | POA: Insufficient documentation

## 2016-01-07 DIAGNOSIS — Z87891 Personal history of nicotine dependence: Secondary | ICD-10-CM | POA: Insufficient documentation

## 2016-01-07 DIAGNOSIS — M25562 Pain in left knee: Secondary | ICD-10-CM | POA: Insufficient documentation
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
## 2016-01-11 ENCOUNTER — Ambulatory Visit (HOSPITAL_COMMUNITY)
Admission: EM | Admit: 2016-01-11 | Discharge: 2016-01-11 | Disposition: A | Discharge status: Home / Self Care | Payer: Self-pay | Attending: Family Medicine | Admitting: Family Medicine

## 2016-01-11 ENCOUNTER — Encounter (HOSPITAL_COMMUNITY): Payer: Self-pay | Admitting: *Deleted

## 2016-01-11 DIAGNOSIS — S8000XA Contusion of unspecified knee, initial encounter: Secondary | ICD-10-CM

## 2016-01-11 MED ORDER — HYDROCODONE-ACETAMINOPHEN 5-325 MG PO TABS: 1.0000 | ORAL_TABLET | Freq: Four times a day (QID) | ORAL | 0 refills | Status: DC | PRN
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
## 2016-01-11 NOTE — Discharge Instructions (Signed)
See dr Eulah Pontmurphy  if continued knee problems
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
## 2016-05-28 ENCOUNTER — Emergency Department (HOSPITAL_COMMUNITY)
Admission: EM | Admit: 2016-05-28 | Discharge: 2016-05-28 | Disposition: A | Discharge status: Home / Self Care | Payer: Self-pay | Attending: Emergency Medicine | Admitting: Emergency Medicine

## 2016-05-28 ENCOUNTER — Encounter (HOSPITAL_COMMUNITY): Payer: Self-pay | Admitting: Emergency Medicine

## 2016-05-28 ENCOUNTER — Emergency Department (HOSPITAL_COMMUNITY): Payer: Self-pay

## 2016-05-28 DIAGNOSIS — R69 Illness, unspecified: Secondary | ICD-10-CM

## 2016-05-28 DIAGNOSIS — J111 Influenza due to unidentified influenza virus with other respiratory manifestations: Secondary | ICD-10-CM

## 2016-05-28 DIAGNOSIS — Z79899 Other long term (current) drug therapy: Secondary | ICD-10-CM | POA: Insufficient documentation

## 2016-05-28 DIAGNOSIS — J069 Acute upper respiratory infection, unspecified: Secondary | ICD-10-CM | POA: Insufficient documentation

## 2016-05-28 DIAGNOSIS — Z87891 Personal history of nicotine dependence: Secondary | ICD-10-CM | POA: Insufficient documentation

## 2016-05-28 DIAGNOSIS — J45909 Unspecified asthma, uncomplicated: Secondary | ICD-10-CM | POA: Insufficient documentation

## 2016-05-28 MED ORDER — BENZONATATE 100 MG PO CAPS: 100.0000 mg | ORAL_CAPSULE | Freq: Every evening | ORAL | 0 refills | Status: DC | PRN

## 2016-05-28 MED ORDER — IBUPROFEN 800 MG PO TABS: 800.0000 mg | ORAL_TABLET | Freq: Three times a day (TID) | ORAL | 0 refills | Status: DC | PRN

## 2016-05-28 MED ORDER — GUAIFENESIN-DM 100-10 MG/5ML PO SYRP: 5.0000 mL | ORAL_SOLUTION | ORAL | 0 refills | Status: DC | PRN

## 2016-05-28 MED ORDER — ALBUTEROL SULFATE HFA 108 (90 BASE) MCG/ACT IN AERS
2.0000 | INHALATION_SPRAY | Freq: Once | RESPIRATORY_TRACT | Status: AC
  Administered 2016-05-28: 2 via RESPIRATORY_TRACT
  Filled 2016-05-28: qty 6.7

## 2016-05-28 NOTE — ED Provider Notes (Signed)
[facility] Provider Note   CSN: [id number] Arrival date & time: 05/28/16  1105     History   Chief Complaint Chief Complaint  Patient presents with  . URI    HPI Kaitlyn Luna is a 26 y.o. female.  HPI   Pt with hx asthma presents with nasal congestion, sore throat, cough, headache,  mild SOB with coughing that began two days ago.  Has been taking advil, orange juice, and multivitamins without improvement.  Did not get flu shot this year.  Has had sick contacts.     Past Medical History:  Diagnosis Date  . Anxiety   . Asthma   . Bipolar depression (HCC)   . Depression   . PTSD (post-traumatic stress disorder)     Patient Active Problem List   Diagnosis Date Noted  . PTSD (post-traumatic stress disorder) 07/26/2013  . MDD (major depressive disorder), recurrent severe, 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    Past Surgical History:  Procedure Laterality Date  . BREAST SURGERY    . KNEE ARTHROSCOPY Left   . TONSILLECTOMY      OB History    Gravida Para Term Preterm AB Living   0 0 0 0 0 0   SAB TAB Ectopic Multiple Live Births   0 0 0 0         Home Medications    Prior to Admission medications   Medication Sig Start Date End Date Taking? Authorizing Provider  albuterol (PROVENTIL HFA;VENTOLIN HFA) 108 (90 BASE) MCG/ACT inhaler Inhale 2 puffs into the lungs every 6 (six) hours as needed for wheezing or shortness of breath.    Historical Provider, MD  ALPRAZolam Prudy Feeler(XANAX) 0.5 MG tablet Take 0.5 mg by mouth at bedtime as needed for anxiety.    Historical Provider, MD  amphetamine-dextroamphetamine (ADDERALL) 5 MG tablet Take 5 mg by mouth daily.    Historical Provider, MD  azithromycin (ZITHROMAX) 500 MG tablet Take two tablets by mouth once. 11/07/15   Rhona RaiderJacob J Stinson, DO  benzonatate (TESSALON) 100 MG capsule Take 1 capsule (100 mg total) by mouth at bedtime as needed for cough. 05/28/16   Trixie DredgeEmily Kelissa Merlin, PA-C  Etonogestrel [facility] [place]) Inject into the skin  once.    Historical Provider, MD  guaiFENesin-dextromethorphan (ROBITUSSIN DM) 100-10 MG/5ML syrup Take 5 mLs by mouth every 4 (four) hours as needed for cough. 05/28/16   Trixie DredgeEmily Bryten Maher, PA-C  HYDROcodone-acetaminophen (NORCO/VICODIN) 5-325 MG tablet Take 1 tablet by mouth every 6 (six) hours as needed. Prn pain 01/11/16   Linna HoffJames D Kindl, MD  ibuprofen (ADVIL,MOTRIN) 800 MG tablet Take 1 tablet (800 mg total) by mouth every 8 (eight) hours as needed for mild pain or moderate pain. 05/28/16   Trixie DredgeEmily Juelz Whittenberg, PA-C  ipratropium (ATROVENT) 0.06 % nasal spray Place 2 sprays into both nostrils 4 (four) times daily. Patient not taking: Reported on 07/23/2015 01/24/15   Linna HoffJames D Kindl, MD  meloxicam (MOBIC) 7.5 MG tablet Take 1 tablet (7.5 mg total) by mouth 2 (two) times daily after a meal. Patient not taking: Reported on 11/01/2015 09/24/15   Linna HoffJames D Kindl, MD  phenazopyridine (PYRIDIUM) 200 MG tablet Take 1 tablet (200 mg total) by mouth 3 (three) times daily. 11/01/15   Hope Orlene OchM Neese, NP  pseudoephedrine (SUDAFED) 60 MG tablet Take 1 tablet (60 mg total) by mouth every 6 (six) hours as needed for congestion. Patient not taking: Reported on 07/23/2015 02/27/15   Renne CriglerJoshua Geiple, PA-C    Family History Family  History  Problem Relation Age of Onset  . Diabetes Father     Social History Social History  Substance Use Topics  . Smoking status: Former Smoker    Types: Cigarettes    Quit date: 12/07/2015  . Smokeless tobacco: Never Used  . Alcohol use Yes     Comment: occasional beer     Allergies   Patient has no known allergies.   Review of Systems Review of Systems  All other systems reviewed and are negative.    Physical Exam Updated Vital Signs BP 125/91 (BP Location: Right Arm)   Pulse 84   Temp 98.6 F (37 C) (Oral)   Resp 18   LMP 04/22/2016   SpO2 98%   Physical Exam  Constitutional: She appears well-developed and well-nourished. No distress.  HENT:  Head: Normocephalic and atraumatic.    Mouth/Throat: Oropharynx is clear and moist. No oropharyngeal exudate, posterior oropharyngeal edema or posterior oropharyngeal erythema.  Eyes: Conjunctivae are normal.  Neck: Neck supple.  Cardiovascular: Normal rate and regular rhythm.   Pulmonary/Chest: Effort normal and breath sounds normal. No respiratory distress. She has no wheezes. She has no rales.  Coughing   Neurological: She is alert.  Skin: She is not diaphoretic.  Nursing note and vitals reviewed.    ED Treatments / Results  Labs (all labs ordered are listed, but only abnormal results are displayed) Labs Reviewed - No data to display  EKG  EKG Interpretation None       Radiology Dg Chest 2 View  Result Date: 05/28/2016 CLINICAL DATA:  Cough and body aches. EXAM: CHEST  2 VIEW COMPARISON:  No recent prior . FINDINGS: Mediastinum and hilar structures normal. Lungs are clear. No pleural effusion or pneumothorax. No acute bony abnormality . IMPRESSION: No acute cardiopulmonary disease. Electronically Signed   By: Maisie Fus  Register   On: 05/28/2016 11:56    Procedures Procedures (including critical care time)  Medications Ordered in ED Medications  albuterol (PROVENTIL HFA;VENTOLIN HFA) 108 (90 Base) MCG/ACT inhaler 2 puff (not administered)     Initial Impression / Assessment and Plan / ED Course  I have reviewed the triage vital signs and the nursing notes.  Pertinent labs & imaging results that were available during my care of the patient were reviewed by me and considered in my medical decision making (see chart for details).     Afebrile, nontoxic patient with constellation of symptoms suggestive of viral syndrome, likely influenza.  No concerning findings on exam.  Discharged home with supportive care, PCP follow up.  Discussed result, findings, treatment, and follow up  with patient.  Pt given return precautions.  Pt verbalizes understanding and agrees with plan.       Final Clinical Impressions(s) /  ED Diagnoses   Final diagnoses:  Influenza-like illness    New Prescriptions New Prescriptions   BENZONATATE (TESSALON) 100 MG CAPSULE    Take 1 capsule (100 mg total) by mouth at bedtime as needed for cough.   GUAIFENESIN-DEXTROMETHORPHAN (ROBITUSSIN DM) 100-10 MG/5ML SYRUP    Take 5 mLs by mouth every 4 (four) hours as needed for cough.   IBUPROFEN (ADVIL,MOTRIN) 800 MG TABLET    Take 1 tablet (800 mg total) by mouth every 8 (eight) hours as needed for mild pain or moderate pain.     Trixie Dredge, PA-C 05/28/16 1206    Gwyneth Sprout, MD 05/28/16 2040

## 2016-05-28 NOTE — ED Notes (Signed)
Pt ambulatory at DC verbalizes understanding of DC teaching NAD.

## 2016-05-28 NOTE — Discharge Instructions (Signed)
Read the information below.  Use the prescribed medication as directed.  Please discuss all new medications with your pharmacist.  You may return to the Emergency Department at any time for worsening condition or any new symptoms that concern you.  If you develop high fevers that do not resolve with tylenol or ibuprofen, you have difficulty swallowing or breathing, or you are unable to tolerate fluids by mouth, return to the ER for a recheck.    °

## 2016-05-28 NOTE — ED Notes (Signed)
Patient transported to X-ray 

## 2016-05-28 NOTE — ED Triage Notes (Signed)
Pt sts URI sx with cough and congestion 

## 2016-06-20 ENCOUNTER — Encounter (HOSPITAL_COMMUNITY): Payer: Self-pay

## 2016-06-20 ENCOUNTER — Emergency Department (HOSPITAL_COMMUNITY)
Admission: EM | Admit: 2016-06-20 | Discharge: 2016-06-20 | Disposition: A | Discharge status: Home / Self Care | Payer: Self-pay | Attending: Emergency Medicine | Admitting: Emergency Medicine

## 2016-06-20 ENCOUNTER — Emergency Department (HOSPITAL_COMMUNITY): Payer: Self-pay

## 2016-06-20 DIAGNOSIS — J111 Influenza due to unidentified influenza virus with other respiratory manifestations: Secondary | ICD-10-CM | POA: Insufficient documentation

## 2016-06-20 DIAGNOSIS — R69 Illness, unspecified: Secondary | ICD-10-CM

## 2016-06-20 DIAGNOSIS — Z87891 Personal history of nicotine dependence: Secondary | ICD-10-CM | POA: Insufficient documentation

## 2016-06-20 DIAGNOSIS — J45909 Unspecified asthma, uncomplicated: Secondary | ICD-10-CM | POA: Insufficient documentation

## 2016-06-20 DIAGNOSIS — Z79899 Other long term (current) drug therapy: Secondary | ICD-10-CM | POA: Insufficient documentation

## 2016-06-20 MED ORDER — BENZONATATE 100 MG PO CAPS: 100.0000 mg | ORAL_CAPSULE | Freq: Three times a day (TID) | ORAL | 0 refills | Status: DC

## 2016-06-20 MED ORDER — GUAIFENESIN 100 MG/5ML PO LIQD: 100.0000 mg | ORAL | 0 refills | Status: DC | PRN

## 2016-06-20 MED ORDER — OSELTAMIVIR PHOSPHATE 75 MG PO CAPS: 75.0000 mg | ORAL_CAPSULE | Freq: Two times a day (BID) | ORAL | 0 refills | Status: DC

## 2016-06-20 MED ORDER — ALBUTEROL SULFATE HFA 108 (90 BASE) MCG/ACT IN AERS
2.0000 | INHALATION_SPRAY | RESPIRATORY_TRACT | Status: DC | PRN
  Administered 2016-06-20: 2 via RESPIRATORY_TRACT
  Filled 2016-06-20: qty 6.7

## 2016-06-20 MED ORDER — ONDANSETRON 4 MG PO TBDP
4.0000 mg | ORAL_TABLET | Freq: Once | ORAL | Status: AC
  Administered 2016-06-20: 4 mg via ORAL
  Filled 2016-06-20: qty 1

## 2016-06-20 MED ORDER — ONDANSETRON HCL 4 MG PO TABS: 4.0000 mg | ORAL_TABLET | Freq: Four times a day (QID) | ORAL | 0 refills | Status: DC

## 2016-06-20 NOTE — ED Triage Notes (Signed)
Pt.. Is here with cough, congestion.  Pt. Also has chills, body aches, n /v , intermittent fever.  Skin is warm, pink dry.  Alert and oriented X 4

## 2016-06-20 NOTE — ED Notes (Signed)
Declined W/C at D/C and was escorted to lobby by RN. 

## 2016-06-20 NOTE — ED Provider Notes (Signed)
[facility]-EMERGENCY DEPT Provider Note   CSN: [id number] Arrival date & time: 06/20/16  1243  By signing my name below, I, Kaitlyn Luna, attest that this documentation has been prepared under the direction and in the presence of [name], PA-C. Electronically Signed: [name] , ED Scribe. 06/20/16. 1:31 PM.    History   Chief Complaint Chief Complaint  Patient presents with  . Cough  . Influenza    HPI  Kaitlyn Luna is a 26 y.o. female with hx of Asthma, Pneumonia who presents to the Emergency Department complaining of worsening, mild to moderate flu-like symptoms with sudden onset x4 days. Pt states she had similar symptoms x3 weeks ago with sore throat that resolved x2 days later after taking Alka Seltzer. x4 days ago, pt developed fever, chills, cough and congestion. Pt reports abdominal pain, hemoptysis, chills, nausea, vomiting, diarrhea, back pain, mylagia, wheezing, SOB, ear pain, fatigue and sick contacts. Pt has tried inhaler, Catering manager with mild relief. Pt reportedly ran out of her inhaler last week. Of note, pt denies getting flu shot this season.   The history is provided by the patient. No language interpreter was used.    Past Medical History:  Diagnosis Date  . Anxiety   . Asthma   . Bipolar depression (HCC)   . Depression   . PTSD (post-traumatic stress disorder)     Patient Active Problem List   Diagnosis Date Noted  . PTSD (post-traumatic stress disorder) 07/26/2013  . MDD (major depressive disorder), recurrent severe, 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    Past Surgical History:  Procedure Laterality Date  . BREAST SURGERY    . KNEE ARTHROSCOPY Left   . TONSILLECTOMY      OB History    Gravida Para Term Preterm AB Living   0 0 0 0 0 0   SAB TAB Ectopic Multiple Live Births   0 0 0 0         Home Medications    Prior to Admission medications   Medication Sig Start Date End Date Taking? Authorizing Provider  albuterol  (PROVENTIL HFA;VENTOLIN HFA) 108 (90 BASE) MCG/ACT inhaler Inhale 2 puffs into the lungs every 6 (six) hours as needed for wheezing or shortness of breath.    Historical Provider, MD  ALPRAZolam Prudy Feeler) 0.5 MG tablet Take 0.5 mg by mouth at bedtime as needed for anxiety.    Historical Provider, MD  amphetamine-dextroamphetamine (ADDERALL) 5 MG tablet Take 5 mg by mouth daily.    Historical Provider, MD  azithromycin (ZITHROMAX) 500 MG tablet Take two tablets by mouth once. 11/07/15   Rhona Raider Stinson, DO  benzonatate (TESSALON) 100 MG capsule Take 1 capsule (100 mg total) by mouth at bedtime as needed for cough. 05/28/16   Trixie Dredge, PA-C  Etonogestrel [facility]) Inject into the skin once.    Historical Provider, MD  guaiFENesin-dextromethorphan (ROBITUSSIN DM) 100-10 MG/5ML syrup Take 5 mLs by mouth every 4 (four) hours as needed for cough. 05/28/16   Trixie Dredge, PA-C  HYDROcodone-acetaminophen (NORCO/VICODIN) 5-325 MG tablet Take 1 tablet by mouth every 6 (six) hours as needed. Prn pain 01/11/16   Linna Hoff, MD  ibuprofen (ADVIL,MOTRIN) 800 MG tablet Take 1 tablet (800 mg total) by mouth every 8 (eight) hours as needed for mild pain or moderate pain. 05/28/16   Trixie Dredge, PA-C  ipratropium (ATROVENT) 0.06 % nasal spray Place 2 sprays into both nostrils 4 (four) times daily. Patient not taking: Reported on 07/23/2015 01/24/15  Linna Hoff, MD  meloxicam (MOBIC) 7.5 MG tablet Take 1 tablet (7.5 mg total) by mouth 2 (two) times daily after a meal. Patient not taking: Reported on 11/01/2015 09/24/15   Linna Hoff, MD  phenazopyridine (PYRIDIUM) 200 MG tablet Take 1 tablet (200 mg total) by mouth 3 (three) times daily. 11/01/15   Hope Orlene Och, NP  pseudoephedrine (SUDAFED) 60 MG tablet Take 1 tablet (60 mg total) by mouth every 6 (six) hours as needed for congestion. Patient not taking: Reported on 07/23/2015 02/27/15   Renne Crigler, PA-C    Family History Family History  Problem Relation Age of  Onset  . Diabetes Father     Social History Social History  Substance Use Topics  . Smoking status: Former Smoker    Types: Cigarettes    Quit date: 12/07/2015  . Smokeless tobacco: Never Used  . Alcohol use Yes     Comment: occasional beer     Allergies   Patient has no known allergies.   Review of Systems Review of Systems  Constitutional: Positive for chills, fatigue and fever.  HENT: Positive for congestion.   Respiratory: Positive for cough and shortness of breath.   Gastrointestinal: Positive for abdominal pain, diarrhea, nausea and vomiting.  Musculoskeletal: Positive for back pain and myalgias.     Physical Exam Updated Vital Signs BP 110/80 (BP Location: Left Arm)   Pulse 95   Temp 97.4 F (36.3 C) (Oral)   Resp 19   Ht 5\' 6"  (1.676 m)   Wt 300 lb (136.1 kg)   SpO2 100%   BMI 48.42 kg/m   Physical Exam  Constitutional: She appears well-developed and well-nourished. No distress.  HENT:  Head: Normocephalic and atraumatic.  Right Ear: Tympanic membrane is erythematous.  Nose: Rhinorrhea present.  Mouth/Throat: Uvula is midline. Posterior oropharyngeal erythema present. No oropharyngeal exudate.  R TM erythematous but mildly obscured by cerumen, L TM is completely obscured by cerumen. No tonsillar enlargement or exudate.  Eyes: Conjunctivae are normal.  Neck: Trachea normal. Neck supple.  Trachea is midline, no anterior cervical lymphadenopathy.  Cardiovascular: Normal rate, regular rhythm and normal heart sounds.   No murmur heard. Pulmonary/Chest: Effort normal. No respiratory distress. She has no wheezes. She has rhonchi (scattered). She has no rales.  Abdominal: Soft. There is no tenderness.  Musculoskeletal: She exhibits no edema.  Neurological: She is alert.  Skin: Skin is warm and dry.  Psychiatric: She has a normal mood and affect.  Nursing note and vitals reviewed.    ED Treatments / Results   DIAGNOSTIC STUDIES: Oxygen Saturation is  100% on RA, normal by my interpretation.   COORDINATION OF CARE: 1:31 PM-Discussed next steps with pt. Pt verbalized understanding and is agreeable with the plan.    Labs (all labs ordered are listed, but only abnormal results are displayed) Labs Reviewed - No data to display  EKG  EKG Interpretation None       Radiology Dg Chest 2 View  Result Date: 06/20/2016 CLINICAL DATA:  Cough and congestion EXAM: CHEST  2 VIEW COMPARISON:  05/28/2016 FINDINGS: The heart size and mediastinal contours are within normal limits. Both lungs are clear. The visualized skeletal structures are unremarkable. IMPRESSION: No active cardiopulmonary disease. Electronically Signed   By: Alcide Clever M.D.   On: 06/20/2016 14:41    Procedures Procedures (including critical care time)  Medications Ordered in ED Medications - No data to display   Initial Impression / Assessment and  Plan / ED Course  I have reviewed the triage vital signs and the nursing notes.  Pertinent labs & imaging results that were available during my care of the patient were reviewed by me and considered in my medical decision making (see chart for details).     Patient with symptoms consistent with influenza.  Vitals are stable, low-grade fever.  No signs of dehydration, tolerating PO's.  Lungs are clear. Due to patient's presentation and physical exam a chest x-ray was obtained, showing no evidence of PNA.  Discussed the cost versus benefit of Tamiflu treatment with the patient.  The patient understands that symptoms are greater than the recommended 24-48 hour window of treatment.  Patient will be discharged with instructions to orally hydrate, rest, and use over-the-counter medications such as anti-inflammatories ibuprofen and Aleve for muscle aches and Tylenol for fever.  Patient will also be given a cough suppressant.    Final Clinical Impressions(s) / ED Diagnoses   Final diagnoses:  Influenza-like illness    New  Prescriptions New Prescriptions   BENZONATATE (TESSALON) 100 MG CAPSULE    Take 1 capsule (100 mg total) by mouth every 8 (eight) hours.   GUAIFENESIN (ROBITUSSIN) 100 MG/5ML LIQUID    Take 5-10 mLs (100-200 mg total) by mouth every 4 (four) hours as needed for cough.   ONDANSETRON (ZOFRAN) 4 MG TABLET    Take 1 tablet (4 mg total) by mouth every 6 (six) hours.   OSELTAMIVIR (TAMIFLU) 75 MG CAPSULE    Take 1 capsule (75 mg total) by mouth every 12 (twelve) hours.    I personally performed the services described in this documentation, which was scribed in my presence. The recorded information has been reviewed and is accurate.       Fayrene HelperBowie Burnis Kaser, PA-C 06/20/16 1511    Mancel BaleElliott Wentz, MD 06/20/16 1726

## 2017-02-01 ENCOUNTER — Encounter: Payer: Self-pay | Admitting: Obstetrics & Gynecology

## 2017-02-01 ENCOUNTER — Ambulatory Visit (INDEPENDENT_AMBULATORY_CARE_PROVIDER_SITE_OTHER): Payer: No Typology Code available for payment source | Admitting: Obstetrics & Gynecology

## 2017-02-01 VITALS — BP 102/71 | HR 78 | Ht 66.0 in | Wt 336.0 lb

## 2017-02-01 DIAGNOSIS — Z113 Encounter for screening for infections with a predominantly sexual mode of transmission: Secondary | ICD-10-CM | POA: Diagnosis not present

## 2017-02-01 DIAGNOSIS — Z1389 Encounter for screening for other disorder: Secondary | ICD-10-CM | POA: Diagnosis not present

## 2017-02-01 DIAGNOSIS — Z30017 Encounter for initial prescription of implantable subdermal contraceptive: Secondary | ICD-10-CM

## 2017-02-01 DIAGNOSIS — Z0000 Encounter for general adult medical examination without abnormal findings: Secondary | ICD-10-CM

## 2017-02-01 DIAGNOSIS — Z01419 Encounter for gynecological examination (general) (routine) without abnormal findings: Secondary | ICD-10-CM | POA: Diagnosis not present

## 2017-02-01 DIAGNOSIS — Z3046 Encounter for surveillance of implantable subdermal contraceptive: Secondary | ICD-10-CM

## 2017-02-01 MED ORDER — ETONOGESTREL 68 MG ~~LOC~~ IMPL
68.0000 mg | DRUG_IMPLANT | Freq: Once | SUBCUTANEOUS | Status: AC
  Administered 2017-02-01: 68 mg via SUBCUTANEOUS

## 2017-02-01 NOTE — Progress Notes (Signed)
   Subjective:    Patient ID: Kaitlyn Luna, female    DOB: May 19, 1990, 26 y.o.   MRN: [id number]  HPI 26 yo G0 here to have her expiring Nexplanon removed and replaced with a new one. She tells me that she would like a BTL. She is certain that she does not want a pregnancy. This will be her 4th Nexplanon.   Review of Systems She tells me that she periodically gets checked for DM. She lives with her boyfriend and they have sex about q 4 months.    Objective:   Physical Exam Consent was signed and time out was done. Her right arm was prepped with betadine after establishing the position of the Nexplanon. The area was infiltrated with 2 cc of 1% lidocaine. A small incision was made and the intact rod was easily removed and noted to be intact.   After adequate anesthesia was assured, the Nexplanon device was placed according to standard of care.  A steristrip was placed and her arm was noted to be hemostatic. It was bandaged.  She tolerated the procedure well.  Cervix appeared normal but did have a frothy white discharge.      Assessment & Plan:  Preventative care- Flu vaccine offered, she declined Pap smear done, with cotesting for trich, BV, and cervical cultures Contraception- Nexplanon

## 2017-02-01 NOTE — Addendum Note (Signed)
Addended by: Mikey Bussing on: 02/01/2017 09:27 AM   Modules accepted: Orders

## 2017-02-03 LAB — CYTOLOGY - PAP
Chlamydia: NEGATIVE
Neisseria Gonorrhea: NEGATIVE

## 2017-02-05 ENCOUNTER — Telehealth: Payer: Self-pay | Admitting: General Practice

## 2017-02-05 NOTE — Telephone Encounter (Signed)
-----   Message from Allie Bossier, MD sent at 02/03/2017  4:47 PM EDT ----- She has an abnormal pap and will need a colpo. Thanks

## 2017-02-05 NOTE — Telephone Encounter (Signed)
Called patient and informed her of results & recommendation. Patient verbalized understanding and states she has had a colposcopy before and it really hurt and she doesn't want to go through that again. Explained to patient importance of follow up. Discussed that if biopsies show higher grader abnormalities we will want to treat those areas which will help prevent cervical cancer. Patient verbalized understanding and will await phone call from front office with appt

## 2017-02-24 ENCOUNTER — Encounter: Payer: Self-pay | Admitting: Obstetrics & Gynecology

## 2017-02-24 ENCOUNTER — Ambulatory Visit (INDEPENDENT_AMBULATORY_CARE_PROVIDER_SITE_OTHER): Payer: No Typology Code available for payment source | Admitting: Obstetrics & Gynecology

## 2017-02-24 ENCOUNTER — Other Ambulatory Visit (HOSPITAL_COMMUNITY)
Admission: RE | Admit: 2017-02-24 | Discharge: 2017-02-24 | Disposition: A | Discharge status: Home / Self Care | Payer: No Typology Code available for payment source | Source: Ambulatory Visit | Attending: Obstetrics & Gynecology | Admitting: Obstetrics & Gynecology

## 2017-02-24 VITALS — BP 110/76 | HR 76 | Wt 336.2 lb

## 2017-02-24 DIAGNOSIS — Z23 Encounter for immunization: Secondary | ICD-10-CM

## 2017-02-24 DIAGNOSIS — Z3202 Encounter for pregnancy test, result negative: Secondary | ICD-10-CM | POA: Diagnosis not present

## 2017-02-24 DIAGNOSIS — N871 Moderate cervical dysplasia: Secondary | ICD-10-CM | POA: Insufficient documentation

## 2017-02-24 DIAGNOSIS — R87612 Low grade squamous intraepithelial lesion on cytologic smear of cervix (LGSIL): Secondary | ICD-10-CM

## 2017-02-24 DIAGNOSIS — Z01419 Encounter for gynecological examination (general) (routine) without abnormal findings: Secondary | ICD-10-CM | POA: Diagnosis not present

## 2017-02-24 LAB — POCT PREGNANCY, URINE: PREG TEST UR: NEGATIVE

## 2017-02-24 MED ORDER — HPV QUADRIVALENT VACCINE IM SUSP: 0.5000 mL | Freq: Once | INTRAMUSCULAR | 0 refills | Status: AC

## 2017-02-24 NOTE — Addendum Note (Signed)
Addended by: Faythe CasaBELLAMY, Letcher Schweikert M on: 02/24/2017 03:26 PM   Modules accepted: Orders

## 2017-02-24 NOTE — Progress Notes (Signed)
   Subjective:    Patient ID: Kaitlyn Luna, female    DOB: May 16, 1990, 26 y.o.   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    Review of Systems     Objective:   Physical Exam Pleasant White female, no distress Well nourished, well hydrated white female, no apparent distress Breathing, conversing, and ambulating normally UPT negative, consent signed, time out done Cervix prepped with acetic acid. Transformation zone seen in its entirety. Colpo adequate. 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 Silver nitrate yielded hemostasis ECC obtained. She tolerated the procedure well.      Assessment & Plan:  LGSIL pap- await pathology Preventative care- She declines flu vaccine but is happy to get Gardasil

## 2017-03-18 ENCOUNTER — Telehealth: Payer: Self-pay | Admitting: *Deleted

## 2017-03-18 NOTE — Telephone Encounter (Signed)
Called Jasiel per Dr. Ellin Sabaove's message and explained Dr.Dove wants her to come in for results and to discuss treatment. Explained registrars will call her with appointment. Would like it to be before 04/26/17- going on cruise.

## 2017-03-18 NOTE — Telephone Encounter (Signed)
Patient came to office very upset after receiving a phone call from the staff about her results.  Patient wants her results and to understand what the plan of care will be.  I brought patient to my office.  I explained her results from colpo.  I explained Dr. Marice Potterove really wanted to sit down with her and discuss this information.  I explained I am a nurse and will answer her questions as best I can.  Patient agrees to meet with Dr. Marice Potterove tomorrow morning.  I explained Dr. Marice Potterove wants to do a procedure called a cold knife cone.  Patient states she just wants to get it taken care of and that it is taking too long.  I explained to patient these changes in dysplasia occur over time.  I explained I do understand her urgency in wanting to get it taken care of.  I explained she may not have her surgery scheduled until late December or early January.  Patient states understanding.  Patient seemed better when she left the office and agreed to come in at 8 am tomorrow to meet with Dr. Marice Potterove.

## 2017-03-18 NOTE — Telephone Encounter (Signed)
-----   Message from Allie BossierMyra C Dove, MD sent at 03/01/2017  9:55 AM EDT ----- She will need a CKC. Please have her come in to discuss this. Thanks

## 2017-03-19 ENCOUNTER — Encounter: Payer: Self-pay | Admitting: Obstetrics & Gynecology

## 2017-03-19 ENCOUNTER — Encounter (HOSPITAL_COMMUNITY): Payer: Self-pay

## 2017-03-19 ENCOUNTER — Ambulatory Visit (INDEPENDENT_AMBULATORY_CARE_PROVIDER_SITE_OTHER): Payer: No Typology Code available for payment source | Admitting: Obstetrics & Gynecology

## 2017-03-19 VITALS — BP 109/76 | HR 75 | Ht 67.0 in | Wt 336.2 lb

## 2017-03-19 DIAGNOSIS — N871 Moderate cervical dysplasia: Secondary | ICD-10-CM | POA: Diagnosis not present

## 2017-03-19 NOTE — Progress Notes (Signed)
   Subjective:    Patient ID: Kaitlyn Luna, female    DOB: 03-12-1991, 26 y.o.   MRN: [id number]  HPI 26 yo S White G0 here a discussion of her pathology results. [place]     Review of Systems She is abstinent since 7/18.    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

## 2017-03-26 ENCOUNTER — Encounter (HOSPITAL_COMMUNITY): Payer: Self-pay

## 2017-03-29 ENCOUNTER — Encounter (HOSPITAL_COMMUNITY): Payer: Self-pay

## 2017-04-10 ENCOUNTER — Emergency Department (HOSPITAL_BASED_OUTPATIENT_CLINIC_OR_DEPARTMENT_OTHER)
Admission: EM | Admit: 2017-04-10 | Discharge: 2017-04-10 | Disposition: A | Discharge status: Home / Self Care | Payer: No Typology Code available for payment source | Attending: Emergency Medicine | Admitting: Emergency Medicine

## 2017-04-10 ENCOUNTER — Encounter (HOSPITAL_BASED_OUTPATIENT_CLINIC_OR_DEPARTMENT_OTHER): Payer: Self-pay | Admitting: Emergency Medicine

## 2017-04-10 ENCOUNTER — Emergency Department (HOSPITAL_BASED_OUTPATIENT_CLINIC_OR_DEPARTMENT_OTHER): Payer: No Typology Code available for payment source

## 2017-04-10 ENCOUNTER — Other Ambulatory Visit: Payer: Self-pay

## 2017-04-10 DIAGNOSIS — J45909 Unspecified asthma, uncomplicated: Secondary | ICD-10-CM | POA: Diagnosis not present

## 2017-04-10 DIAGNOSIS — Z87891 Personal history of nicotine dependence: Secondary | ICD-10-CM | POA: Diagnosis not present

## 2017-04-10 DIAGNOSIS — Z79899 Other long term (current) drug therapy: Secondary | ICD-10-CM | POA: Insufficient documentation

## 2017-04-10 DIAGNOSIS — L729 Follicular cyst of the skin and subcutaneous tissue, unspecified: Secondary | ICD-10-CM | POA: Diagnosis not present

## 2017-04-10 DIAGNOSIS — M7989 Other specified soft tissue disorders: Secondary | ICD-10-CM | POA: Diagnosis present

## 2017-04-10 MED ORDER — CEPHALEXIN 500 MG PO CAPS: 500.0000 mg | ORAL_CAPSULE | Freq: Three times a day (TID) | ORAL | 0 refills | Status: DC

## 2017-04-10 NOTE — ED Notes (Signed)
Transported pt to [place]. Pt insisted on walking

## 2017-04-10 NOTE — ED Provider Notes (Signed)
[facility] EMERGENCY DEPARTMENT Provider Note   CSN: [id number] Arrival date & time: 04/10/17  1353     History   Chief Complaint Chief Complaint  Patient presents with  . Leg Pain    HPI Kaitlyn Luna is a 26 y.o. female.  Patient is a 26 year old female with a history of bipolar disorder, asthma, and depression who presents with leg swelling.  She states over the last 2 weeks she has had a knot/swollen area to her right lower leg.  She denies any enlargement the area but it is sore to touch.  She denies any known injury to the leg.  [facility] and was [facility] here have an ultrasound to assess for DVT.  She denies any chest pain or shortness of breath.  No fevers.  No history of similar symptoms in the past.  No history of VTE.  She has not taken anything for the symptoms.      Past Medical History:  Diagnosis Date  . Anxiety   . Asthma   . Bipolar depression (HCC)   . Depression   . PTSD (post-traumatic stress disorder)     Patient Active Problem List   Diagnosis Date Noted  . Morbid obesity (HCC) 02/01/2017  . PTSD (post-traumatic stress disorder) 07/26/2013  . MDD (major depressive disorder), recurrent severe, 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    Past Surgical History:  Procedure Laterality Date  . BREAST SURGERY    . KNEE ARTHROSCOPY Left   . TONSILLECTOMY      OB History    Gravida Para Term Preterm AB Living   0 0 0 0 0 0   SAB TAB Ectopic Multiple Live Births   0 0 0 0         Home Medications    Prior to Admission medications   Medication Sig Start Date End Date Taking? Authorizing Provider  albuterol (PROVENTIL HFA;VENTOLIN HFA) 108 (90 BASE) MCG/ACT inhaler Inhale 2 puffs into the lungs every 6 (six) hours as needed for wheezing or shortness of breath.    [provider]  ALPRAZolam Prudy Feeler(XANAX) 0.5 MG tablet Take 0.5 mg by mouth at bedtime as needed for anxiety.    [provider]    amphetamine-dextroamphetamine (ADDERALL) 5 MG tablet Take 5 mg by mouth daily.    [provider]  azithromycin (ZITHROMAX) 500 MG tablet Take two tablets by mouth once. Patient not taking: Reported on 02/24/2017 11/07/15   Levie HeritageStinson, Jacob J, DO  benzonatate (TESSALON) 100 MG capsule Take 1 capsule (100 mg total) by mouth every 8 (eight) hours. Patient not taking: Reported on 02/24/2017 06/20/16   Fayrene Helperran, Bowie, PA-C  cephALEXin (KEFLEX) 500 MG capsule Take 1 capsule (500 mg total) by mouth 3 (three) times daily. 04/10/17   Rolan BuccoBelfi, Prim Morace, MD  Etonogestrel (IMPLANON [place]) Inject into the skin once.    [provider]  guaiFENesin (ROBITUSSIN) 100 MG/5ML liquid Take 5-10 mLs (100-200 mg total) by mouth every 4 (four) hours as needed for cough. Patient not taking: Reported on 02/24/2017 06/20/16   Fayrene Helperran, Bowie, PA-C  guaiFENesin-dextromethorphan [facility](ROBITUSSIN DM) 100-10 MG/5ML syrup Take 5 mLs by mouth every 4 (four) hours as needed for cough. Patient not taking: Reported on 02/24/2017 05/28/16   Trixie DredgeWest, Emily, PA-C  HYDROcodone-acetaminophen (NORCO/VICODIN) 5-325 MG tablet Take 1 tablet by mouth every 6 (six) hours as needed. Prn pain Patient not taking: Reported on 02/24/2017 01/11/16   Linna HoffKindl, James D, MD  ibuprofen (ADVIL,MOTRIN) 800  MG tablet Take 1 tablet (800 mg total) by mouth every 8 (eight) hours as needed for mild pain or moderate pain. Patient not taking: Reported on 02/24/2017 05/28/16   Trixie Dredge, PA-C  ipratropium (ATROVENT) 0.06 % nasal spray Place 2 sprays into both nostrils 4 (four) times daily. Patient not taking: Reported on 07/23/2015 01/24/15   Linna Hoff, MD  meloxicam (MOBIC) 7.5 MG tablet Take 1 tablet (7.5 mg total) by mouth 2 (two) times daily after a meal. Patient not taking: Reported on 11/01/2015 09/24/15   Linna Hoff, MD  ondansetron (ZOFRAN) 4 MG tablet Take 1 tablet (4 mg total) by mouth every 6 (six) hours. Patient not taking: Reported on 02/24/2017 06/20/16    [name], PA-C  oseltamivir (TAMIFLU) 75 MG capsule Take 1 capsule (75 mg total) by mouth every 12 (twelve) hours. Patient not taking: Reported on 02/24/2017 06/20/16   [name], PA-C  phenazopyridine (PYRIDIUM) 200 MG tablet Take 1 tablet (200 mg total) by mouth 3 (three) times daily. Patient not taking: Reported on 02/24/2017 11/01/15   Janne Napoleon, NP  pseudoephedrine (SUDAFED) 60 MG tablet Take 1 tablet (60 mg total) by mouth every 6 (six) hours as needed for congestion. Patient not taking: Reported on 07/23/2015 02/27/15   Renne Crigler, PA-C    Family History Family History  Problem Relation Age of Onset  . Diabetes Father     Social History Social History   Tobacco Use  . Smoking status: Former Smoker    Types: Cigarettes    Last attempt to quit: 12/07/2015    Years since quitting: 1.3  . Smokeless tobacco: Never Used  Substance Use Topics  . Alcohol use: Yes    Comment: occasional beer  . Drug use: No     Allergies   Patient has no known allergies.   Review of Systems Review of Systems  Constitutional: Negative for chills, diaphoresis, fatigue and fever.  HENT: Negative for congestion, rhinorrhea and sneezing.   Eyes: Negative.   Respiratory: Negative for cough, chest tightness and shortness of breath.   Cardiovascular: Negative for chest pain and leg swelling.  Gastrointestinal: Negative for abdominal pain, blood in stool, diarrhea, nausea and vomiting.  Genitourinary: Negative for difficulty urinating, flank pain, frequency and hematuria.  Musculoskeletal: Negative for arthralgias and back pain.       Leg swelling  Skin: Negative for rash.  Neurological: Negative for dizziness, speech difficulty, weakness, numbness and headaches.     Physical Exam Updated Vital Signs BP 115/64   Pulse 70   Temp 98.5 F (36.9 C) (Oral)   Resp 18   Ht 5\' 7"  (1.702 m)   Wt (!) 145.2 kg (320 lb)   SpO2 100%   BMI 50.12 kg/m   Physical Exam  Constitutional:  She is oriented to person, place, and time. She appears well-developed and well-nourished.  HENT:  Head: Normocephalic and atraumatic.  Eyes: Pupils are equal, round, and reactive to light.  Neck: Normal range of motion. Neck supple.  Cardiovascular: Normal rate, regular rhythm and normal heart sounds.  Pulmonary/Chest: Effort normal and breath sounds normal. No respiratory distress. She has no wheezes. She has no rales. She exhibits no tenderness.  Abdominal: Soft. Bowel sounds are normal. There is no tenderness. There is no rebound and no guarding.  Musculoskeletal: Normal range of motion. She exhibits edema and tenderness.  Pt with swollen, cyst-like structure to right posterior leg overlying the achilles tendon.  Mild erythema, no  warm.  No bony tenderness.  Normal sensation and motor function in the leg.  Pedal pulses are intact.  Lymphadenopathy:    She has no cervical adenopathy.  Neurological: She is alert and oriented to person, place, and time.  Skin: Skin is warm and dry. No rash noted.  Psychiatric: She has a normal mood and affect.     ED Treatments / Results  Labs (all labs ordered are listed, but only abnormal results are displayed) Labs Reviewed - No data to display  EKG  EKG Interpretation None       Radiology [place] Venous Img Lower Right (dvt Study)  Result Date: 04/10/2017 CLINICAL DATA:  Right Lower leg swelling and tenderness for 2 weeks. EXAM: RIGHT LOWER EXTREMITY VENOUS DOPPLER ULTRASOUND TECHNIQUE: Gray-scale sonography with graded compression, as well as color Doppler and duplex ultrasound were performed to evaluate the lower extremity deep venous systems from the level of the common femoral vein and including the common femoral, femoral, profunda femoral, popliteal and calf veins including the posterior tibial, peroneal and gastrocnemius veins when visible. The superficial great saphenous vein was also interrogated. Spectral Doppler was utilized to evaluate  flow at rest and with distal augmentation maneuvers in the common femoral, femoral and popliteal veins. COMPARISON:  None. FINDINGS: Contralateral Common Femoral Vein: Respiratory phasicity is normal and symmetric with the symptomatic side. No evidence of thrombus. Normal compressibility. Common Femoral Vein: No evidence of thrombus. Normal compressibility, respiratory phasicity and response to augmentation. Saphenofemoral Junction: No evidence of thrombus. Normal compressibility and flow on color Doppler imaging. Profunda Femoral Vein: No evidence of thrombus. Normal compressibility and flow on color Doppler imaging. Femoral Vein: No evidence of thrombus. Normal compressibility, respiratory phasicity and response to augmentation. Popliteal Vein: No evidence of thrombus. Normal compressibility, respiratory phasicity and response to augmentation. Calf Veins: No evidence of thrombus. Normal compressibility and flow on color Doppler imaging. Superficial Great Saphenous Vein: No evidence of thrombus. Normal compressibility. Venous Reflux:  None. Other Findings:  None. IMPRESSION: No evidence of deep venous thrombosis. Electronically Signed   By: Myles RosenthalJohn  Stahl M.D.   On: 04/10/2017 16:22    Procedures Procedures (including critical care time)  Medications Ordered in ED Medications - No data to display   Initial Impression / Assessment and Plan / ED Course  I have reviewed the triage vital signs and the nursing notes.  Pertinent labs & imaging results that were available during my care of the patient were reviewed by me and considered in my medical decision making (see chart for details).     Patient is a 26 year old female who presents with a cystic structure overlying her right Achilles tendon.  There is no evidence of DVT.  No circumferential leg swelling.  There is some mild erythema but no warmth.  There is no drainable lesion.  I will start her on Keflex and she has no PCP.  I will refer her for a  follow-up with orthopedics.  Return precautions were given.  Final Clinical Impressions(s) / ED Diagnoses   Final diagnoses:  Cyst of skin    ED Discharge Orders        Ordered    cephALEXin (KEFLEX) 500 MG capsule  3 times daily     04/10/17 1702       Rolan BuccoBelfi, Belem Hintze, MD 04/10/17 1703

## 2017-04-10 NOTE — ED Notes (Signed)
Pt given d/c instructions as per chart. Rx x 1. Verbalizes understanding. No questions. 

## 2017-04-10 NOTE — ED Triage Notes (Signed)
Patient reports pain to right ankle x 2 weeks.  States "there is a lump there".  Also reports pain to left knee with "lump on front and back of my knee".  Denies injuries.  [facility]orts [facility] and referred to ER to R/O DVT.

## 2017-04-15 ENCOUNTER — Encounter: Payer: Self-pay | Admitting: Family Medicine

## 2017-04-15 ENCOUNTER — Ambulatory Visit (INDEPENDENT_AMBULATORY_CARE_PROVIDER_SITE_OTHER): Payer: No Typology Code available for payment source | Admitting: Family Medicine

## 2017-04-15 DIAGNOSIS — M25571 Pain in right ankle and joints of right foot: Secondary | ICD-10-CM | POA: Diagnosis not present

## 2017-04-15 NOTE — Patient Instructions (Signed)
Your ultrasound is reassuring. You have a hematoma overlying your achilles. Ice (or heat if this feels better) 15 minutes at a time 3-4 times a day at least. Ibuprofen 600mg  three times a day with food OR aleve 2 tabs twice a day with food for pain and inflammation. Elevate above your heart when possible. No restrictions on activities. Heel lifts should help take some pressure off this as well. When tolerated a compression sleeve or ACE wrap to the area can help push the swelling out. Follow up with me in 4 weeks if this isn't much better (some swelling may still remain but pain should be gone and most of swelling resolved).

## 2017-04-19 ENCOUNTER — Encounter: Payer: Self-pay | Admitting: Family Medicine

## 2017-04-19 DIAGNOSIS — M25571 Pain in right ankle and joints of right foot: Secondary | ICD-10-CM | POA: Insufficient documentation

## 2017-04-19 NOTE — Assessment & Plan Note (Signed)
patient denies known injury but swollen area is superficial, firm consistent with a small hematoma - no pore to suggest sebaceous cyst.  No redness/erythema.  Icing, ibuprofen or aleve.  Heel lifts.  Compression when tolerated.  F/u in 4 weeks if not improving.

## 2017-04-19 NOTE — Progress Notes (Signed)
PCP: Patient, No Pcp Per  Subjective:   HPI: Patient is a 26 y.o. female here for right ankle pain.  Patient denies known injury or trauma. She states about 2 weeks ago she noticed a painful knot posterior right ankle. Pain level 3/10 but up to 6/10 and sharp at worst. Worse if she wears a sock. Has been icing, resting. No skin changes, redness, numbness.  Past Medical History:  Diagnosis Date  . Anxiety   . Asthma   . Bipolar depression (HCC)   . Depression   . PTSD (post-traumatic stress disorder)     Current Outpatient Medications on File Prior to Visit  Medication Sig Dispense Refill  . albuterol (PROVENTIL HFA;VENTOLIN HFA) 108 (90 BASE) MCG/ACT inhaler Inhale 2 puffs into the lungs every 6 (six) hours as needed for wheezing or shortness of breath.    . ALPRAZolam (XANAX) 0.5 MG tablet Take 0.5 mg by mouth at bedtime as needed for anxiety.    Marland Kitchen. amphetamine-dextroamphetamine (ADDERALL) 5 MG tablet Take 5 mg by mouth daily.    Marland Kitchen. azithromycin (ZITHROMAX) 500 MG tablet Take two tablets by mouth once. (Patient not taking: Reported on 02/24/2017) 2 tablet 0  . benzonatate (TESSALON) 100 MG capsule Take 1 capsule (100 mg total) by mouth every 8 (eight) hours. (Patient not taking: Reported on 02/24/2017) 21 capsule 0  . cephALEXin (KEFLEX) 500 MG capsule Take 1 capsule (500 mg total) by mouth 3 (three) times daily. 21 capsule 0  . Etonogestrel (IMPLANON [place]) Inject into the skin once.    Marland Kitchen. guaiFENesin (ROBITUSSIN) 100 MG/5ML liquid Take 5-10 mLs (100-200 mg total) by mouth every 4 (four) hours as needed for cough. (Patient not taking: Reported on 02/24/2017) 60 mL 0  . guaiFENesin-dextromethorphan (ROBITUSSIN DM) 100-10 MG/5ML syrup Take 5 mLs by mouth every 4 (four) hours as needed for cough. (Patient not taking: Reported on 02/24/2017) 118 mL 0  . HYDROcodone-acetaminophen (NORCO/VICODIN) 5-325 MG tablet Take 1 tablet by mouth every 6 (six) hours as needed. Prn pain (Patient not  taking: Reported on 02/24/2017) 15 tablet 0  . ibuprofen (ADVIL,MOTRIN) 800 MG tablet Take 1 tablet (800 mg total) by mouth every 8 (eight) hours as needed for mild pain or moderate pain. (Patient not taking: Reported on 02/24/2017) 15 tablet 0  . ipratropium (ATROVENT) 0.06 % nasal spray Place 2 sprays into both nostrils 4 (four) times daily. (Patient not taking: Reported on 07/23/2015) 15 mL 12  . meloxicam (MOBIC) 7.5 MG tablet Take 1 tablet (7.5 mg total) by mouth 2 (two) times daily after a meal. (Patient not taking: Reported on 11/01/2015) 30 tablet 1  . ondansetron (ZOFRAN) 4 MG tablet Take 1 tablet (4 mg total) by mouth every 6 (six) hours. (Patient not taking: Reported on 02/24/2017) 12 tablet 0  . oseltamivir (TAMIFLU) 75 MG capsule Take 1 capsule (75 mg total) by mouth every 12 (twelve) hours. (Patient not taking: Reported on 02/24/2017) 10 capsule 0  . phenazopyridine (PYRIDIUM) 200 MG tablet Take 1 tablet (200 mg total) by mouth 3 (three) times daily. (Patient not taking: Reported on 02/24/2017) 6 tablet 0  . pseudoephedrine (SUDAFED) 60 MG tablet Take 1 tablet (60 mg total) by mouth every 6 (six) hours as needed for congestion. (Patient not taking: Reported on 07/23/2015) 30 tablet 0  . [DISCONTINUED] citalopram (CELEXA) 40 MG tablet Take 40 mg by mouth daily.     No current facility-administered medications on file prior to visit.     Past  Surgical History:  Procedure Laterality Date  . BREAST SURGERY    . KNEE ARTHROSCOPY Left   . TONSILLECTOMY      No Known Allergies  Social History   Socioeconomic History  . Marital status: Single    Spouse name: Not on file  . Number of children: Not on file  . Years of education: Not on file  . Highest education level: Not on file  Social Needs  . Financial resource strain: Not on file  . Food insecurity - worry: Not on file  . Food insecurity - inability: Not on file  . Transportation needs - medical: Not on file  . Transportation  needs - non-medical: Not on file  Occupational History  . Not on file  Tobacco Use  . Smoking status: Former Smoker    Types: Cigarettes    Last attempt to quit: 12/07/2015    Years since quitting: 1.3  . Smokeless tobacco: Never Used  Substance and Sexual Activity  . Alcohol use: Yes    Comment: occasional beer  . Drug use: No  . Sexual activity: Yes    Birth control/protection: Implant, Condom  Other Topics Concern  . Not on file  Social History Narrative  . Not on file    Family History  Problem Relation Age of Onset  . Diabetes Father     BP 124/79   Pulse 77   Ht 5\' 7"  (1.702 m)   Wt (!) 320 lb (145.2 kg)   BMI 50.12 kg/m   Review of Systems: See HPI above.     Objective:  Physical Exam:  Gen: NAD, comfortable in exam room  Right ankle/foot: Localized swelling posterior lower leg about 4-5 cm proximal to achilles insertion on calcaneus.  No bruising, erythema, other deformity. FROM with 5/5 strength without pain. TTP in swollen area, firm to touch. Negative ant drawer and talar tilt.   Negative syndesmotic compression. Thompsons test negative. NV intact distally.  Left ankle: FROM without pain or swelling.   MSK u/s right ankle:  Achilles normal without neovascularity, nodule, tears.  Superficial swelling within soft tissue consistent with small hematoma.  Assessment & Plan:  1. Right ankle pain - patient denies known injury but swollen area is superficial, firm consistent with a small hematoma - no pore to suggest sebaceous cyst.  No redness/erythema.  Icing, ibuprofen or aleve.  Heel lifts.  Compression when tolerated.  F/u in 4 weeks if not improving.

## 2017-04-21 ENCOUNTER — Ambulatory Visit: Payer: Self-pay

## 2017-04-26 ENCOUNTER — Ambulatory Visit: Payer: Self-pay

## 2017-05-05 ENCOUNTER — Other Ambulatory Visit: Payer: Self-pay

## 2017-05-05 ENCOUNTER — Encounter (HOSPITAL_COMMUNITY): Payer: Self-pay | Admitting: *Deleted

## 2017-05-14 ENCOUNTER — Ambulatory Visit (INDEPENDENT_AMBULATORY_CARE_PROVIDER_SITE_OTHER): Payer: No Typology Code available for payment source | Admitting: Medical

## 2017-05-14 ENCOUNTER — Ambulatory Visit (INDEPENDENT_AMBULATORY_CARE_PROVIDER_SITE_OTHER): Payer: No Typology Code available for payment source | Admitting: Family Medicine

## 2017-05-14 ENCOUNTER — Encounter: Payer: Self-pay | Admitting: Medical

## 2017-05-14 VITALS — BP 111/53 | HR 78 | Temp 98.4°F | Resp 16 | Ht 67.0 in | Wt 342.0 lb

## 2017-05-14 DIAGNOSIS — M25571 Pain in right ankle and joints of right foot: Secondary | ICD-10-CM | POA: Diagnosis not present

## 2017-05-14 DIAGNOSIS — L52 Erythema nodosum: Secondary | ICD-10-CM | POA: Diagnosis not present

## 2017-05-14 DIAGNOSIS — R5383 Other fatigue: Secondary | ICD-10-CM

## 2017-05-14 DIAGNOSIS — S86001A Unspecified injury of right Achilles tendon, initial encounter: Secondary | ICD-10-CM

## 2017-05-14 DIAGNOSIS — R635 Abnormal weight gain: Secondary | ICD-10-CM

## 2017-05-14 DIAGNOSIS — E669 Obesity, unspecified: Secondary | ICD-10-CM

## 2017-05-14 LAB — CBC WITH DIFFERENTIAL/PLATELET
BASOS ABS: 0.1 10*3/uL (ref 0.0–0.1)
Basophils Relative: 0.6 % (ref 0.0–3.0)
Eosinophils Absolute: 0.3 10*3/uL (ref 0.0–0.7)
Eosinophils Relative: 2.5 % (ref 0.0–5.0)
HCT: 41.6 % (ref 36.0–46.0)
HEMOGLOBIN: 13.7 g/dL (ref 12.0–15.0)
LYMPHS ABS: 2.5 10*3/uL (ref 0.7–4.0)
LYMPHS PCT: 24.6 % (ref 12.0–46.0)
MCHC: 33 g/dL (ref 30.0–36.0)
MCV: 88.8 fl (ref 78.0–100.0)
MONOS PCT: 6.7 % (ref 3.0–12.0)
Monocytes Absolute: 0.7 10*3/uL (ref 0.1–1.0)
NEUTROS PCT: 65.6 % (ref 43.0–77.0)
Neutro Abs: 6.8 10*3/uL (ref 1.4–7.7)
Platelets: 324 10*3/uL (ref 150.0–400.0)
RBC: 4.69 Mil/uL (ref 3.87–5.11)
RDW: 13.2 % (ref 11.5–15.5)
WBC: 10.3 10*3/uL (ref 4.0–10.5)

## 2017-05-14 LAB — COMPREHENSIVE METABOLIC PANEL
ALK PHOS: 70 U/L (ref 39–117)
ALT: 20 U/L (ref 0–35)
AST: 15 U/L (ref 0–37)
Albumin: 4.2 g/dL (ref 3.5–5.2)
BILIRUBIN TOTAL: 0.4 mg/dL (ref 0.2–1.2)
BUN: 9 mg/dL (ref 6–23)
CO2: 24 mEq/L (ref 19–32)
Calcium: 9.2 mg/dL (ref 8.4–10.5)
Chloride: 105 mEq/L (ref 96–112)
Creatinine, Ser: 0.64 mg/dL (ref 0.40–1.20)
GFR: 117.89 mL/min (ref >60.00)
Glucose, Bld: 98 mg/dL (ref 70–99)
Potassium: 4 mEq/L (ref 3.5–5.1)
SODIUM: 137 meq/L (ref 135–145)
TOTAL PROTEIN: 7.1 g/dL (ref 6.0–8.3)

## 2017-05-14 LAB — TSH: TSH: 1.54 u[IU]/mL (ref 0.35–4.50)

## 2017-05-14 LAB — T4, FREE: FREE T4: 0.9 ng/dL (ref 0.60–1.60)

## 2017-05-14 MED ORDER — CEPHALEXIN 500 MG PO CAPS: 500.0000 mg | ORAL_CAPSULE | Freq: Four times a day (QID) | ORAL | 0 refills | Status: DC

## 2017-05-14 NOTE — Progress Notes (Signed)
Subjective:    Patient ID: Kaitlyn Luna, female    DOB: 05/29/1990, 27 y.o.   MRN: [id number]  HPI  Pt in for the first time.  Pt works at a call center. Exercise some daily. Has 2 dogs. Nonsmoker, occasional alcohol. Married-no children  Pt updates me that she is seeing sports medicine for possible hematoma over achilles tendon today. Seeing Dr. Pearletha ForgeHudnall.  Pt also seen gyn for pap. Has irregular pap. Second procedure to be done on May 27, 2016.  Pt also wants to loose weight. She has been exercising more and trying to diet. Pt not sure how many steps she gets a day. Describes moderate healthy diet. Pt has been over 300 lb for years. Pt   Hx of some depression and anxiety. Pt states her mood is well controlled presently. Has been controlled for 2 years. Dad moving away seemed to help a lot. Not currently on xanax or celexa.    Review of Systems  Constitutional: Positive for fatigue. Negative for chills and fever.       Mild fatigue.  HENT: Negative for congestion and ear pain.   Respiratory: Negative for cough, chest tightness, shortness of breath and wheezing.   Cardiovascular: Negative for chest pain and palpitations.  Gastrointestinal: Negative for abdominal distention, abdominal pain, blood in stool and vomiting.  Musculoskeletal: Negative for back pain.  Skin: Negative for rash.  Neurological: Negative for dizziness, light-headedness and headaches.  Hematological: Negative for adenopathy. Does not bruise/bleed easily.  Psychiatric/Behavioral: Negative for behavioral problems, confusion and dysphoric mood. The patient is not nervous/anxious.    Past Medical History:  Diagnosis Date  . Anemia   . Anxiety   . Asthma   . Bipolar depression (HCC)   . Chronic kidney disease    UTI AND FREQUENT KIDNEY INFECTIONS FREQUENTLY  . Depression   . Pneumonia 2017  . PTSD (post-traumatic stress disorder)   . Sleep apnea    not using c-pap     Social History   Socioeconomic  History  . Marital status: Single    Spouse name: Not on file  . Number of children: Not on file  . Years of education: Not on file  . Highest education level: Not on file  Social Needs  . Financial resource strain: Not on file  . Food insecurity - worry: Not on file  . Food insecurity - inability: Not on file  . Transportation needs - medical: Not on file  . Transportation needs - non-medical: Not on file  Occupational History  . Not on file  Tobacco Use  . Smoking status: Former Smoker    Types: Cigarettes    Last attempt to quit: 12/07/2015    Years since quitting: 1.4  . Smokeless tobacco: Never Used  Substance and Sexual Activity  . Alcohol use: Yes    Comment: occasional beer  . Drug use: No  . Sexual activity: Yes    Birth control/protection: Implant, Condom  Other Topics Concern  . Not on file  Social History Narrative  . Not on file    Past Surgical History:  Procedure Laterality Date  . BREAST SURGERY     REDUCTI0N  . KNEE ARTHROSCOPY Left   . TONSILLECTOMY      Family History  Problem Relation Age of Onset  . Diabetes Father     No Known Allergies  Current Outpatient Medications on File Prior to Visit  Medication Sig Dispense Refill  . albuterol (PROVENTIL HFA;VENTOLIN HFA) 108 (  90 BASE) MCG/ACT inhaler Inhale 2 puffs into the lungs every 6 (six) hours as needed for wheezing or shortness of breath.    . ALPRAZolam (XANAX) 0.5 MG tablet Take 0.5 mg by mouth at bedtime as needed for anxiety.    Marland Kitchen amphetamine-dextroamphetamine (ADDERALL) 5 MG tablet Take 5 mg by mouth daily.    . Etonogestrel (IMPLANON [place]) Inject into the skin once.    Marland Kitchen ibuprofen (ADVIL,MOTRIN) 800 MG tablet Take 1 tablet (800 mg total) by mouth every 8 (eight) hours as needed for mild pain or moderate pain. (Patient not taking: Reported on 02/24/2017) 15 tablet 0  . ondansetron (ZOFRAN) 4 MG tablet Take 1 tablet (4 mg total) by mouth every 6 (six) hours. (Patient not taking: Reported on  02/24/2017) 12 tablet 0  . [DISCONTINUED] citalopram (CELEXA) 40 MG tablet Take 40 mg by mouth daily.     No current facility-administered medications on file prior to visit.     BP (!) 111/53   Pulse 78   Temp 98.4 F (36.9 C) (Oral)   Resp 16   Ht 5\' 7"  (1.702 m)   Wt (!) 342 lb (155.1 kg)   SpO2 100%   BMI 53.56 kg/m       Objective:   Physical Exam  General Mental Status- Alert. General Appearance- Not in acute distress.   Skin General: Color- Normal Color. Moisture- Normal Moisture.  Neck Carotid Arteries- Normal color. Moisture- Normal Moisture. No carotid bruits. No JVD.  Chest and Lung Exam Auscultation: Breath Sounds:-Normal.  Cardiovascular Auscultation:Rythm- Regular. Murmurs & Other Heart Sounds:Auscultation of the heart reveals- No Murmurs.  Abdomen Inspection:-Inspeection Normal. Palpation/Percussion:Note:No mass. Palpation and Percussion of the abdomen reveal- Non Tender, Non Distended + BS, no rebound or guarding.   Neurologic Cranial Nerve exam:- CN III-XII intact(No nystagmus), symmetric smile. Strength:- 5/5 equal and symmetric strength both upper and lower extremities.  Rt ankle/foot- achilles heel area. Raised bump.      Assessment & Plan:  For your desire to lose weight and obesity, I want to get TSH and T4 studies check thyroid function.  Provided this is normal would recommend low sugar and low-fat diet.  Try to increase your exercise.  Goal of thousand to 10,000 steps a day.  Also considering medication to lose weight.  For mild fatigue will follow the CBC, CMP and thyroid studies.  The area over your Achilles tendon is difficult to say exactly what it may be.  If it is not a hematoma I think there is a possibility you could be a ganglion cyst.  We will see what Dr. Pearletha Forge will think on appointment today.  Your mood has been good over the last 2 years but if you find having recurrent difficulty has in the past please let us  know.  Follow-up date to be determined after lab review.  Damiya Sandefur, Ramon Dredge, PA-C

## 2017-05-14 NOTE — Patient Instructions (Signed)
For your desire to lose weight and obesity, I want to get TSH and T4 studies check thyroid function.  Provided this is normal would recommend low sugar and low-fat diet.  Try to increase your exercise.  Goal of thousand to 10,000 steps a day.  Also considering medication to lose weight.  For mild fatigue will follow the CBC, CMP and thyroid studies.  The area over your Achilles tendon is difficult to say exactly what it may be.  If it is not a hematoma I think there is a possibility you could be a ganglion cyst.  We will see what Dr. Pearletha ForgeHudnall will think on appointment today.  Your mood has been good over the last 2 years but if you find having recurrent difficulty has in the past please let us know.  Follow-up date to be determined after lab review.

## 2017-05-14 NOTE — Patient Instructions (Signed)
This is most consistent with erythema nodosum. Your orthopedic exam is normal - the ultrasound in these areas only shows soft tissue swelling but no defined mass, no lipoma, and bone/muscle/achilles tendon deep to these areas are completely normal. Try keflex 500mg  four times a day for 7 days in case this is a deeper bacterial infection but I doubt this is the case. We will refer you to dermatology as quickly as possible. I think it's ok for you to go ahead with the procedure in a couple weeks - it will not make these worse though the pain can be worse after being in the stirrups - ask if they can put a towel around the area if it looks like the one on the back of your leg will be resting against the stirrups to take pressure off this.

## 2017-05-18 ENCOUNTER — Encounter: Payer: Self-pay | Admitting: Family Medicine

## 2017-05-18 DIAGNOSIS — L52 Erythema nodosum: Secondary | ICD-10-CM | POA: Insufficient documentation

## 2017-05-18 NOTE — Assessment & Plan Note (Signed)
two superficial nodules noted now but ultrasound is reassuring that these are not abscesses or masses.  Concern these represent presentation of erythema nodosum.  Reassured no orthopedic concerns.  Despite no redness given there is tenderness as a precaution will place her on keflex qid for 7 days.  Refer to dermatology.

## 2017-05-18 NOTE — Progress Notes (Signed)
PCP: Esperanza RichtersSaguier, Edward, PA-C  Subjective:   HPI: Patient is a 27 y.o. female here for right ankle pain.  12/6: Patient denies known injury or trauma. She states about 2 weeks ago she noticed a painful knot posterior right ankle. Pain level 3/10 but up to 6/10 and sharp at worst. Worse if she wears a sock. Has been icing, resting. No skin changes, redness, numbness.  1/4: Patient reports she has worsened since last visit. Swollen area behind right ankle persists at 3/10 level but up to 5/10 and sharp at worst. Bothers with walking and compression like wearing a sock. Has another new area that formed proximal right shin as well that is firm and painful. No fever, chills, sweats. No numbness.  Past Medical History:  Diagnosis Date  . Anemia   . Anxiety   . Asthma   . Bipolar depression (HCC)   . Chronic kidney disease    UTI AND FREQUENT KIDNEY INFECTIONS FREQUENTLY  . Depression   . Pneumonia 2017  . PTSD (post-traumatic stress disorder)   . Sleep apnea    not using c-pap    Current Outpatient Medications on File Prior to Visit  Medication Sig Dispense Refill  . Etonogestrel (IMPLANON [place]) Inject into the skin once.    Marland Kitchen. ibuprofen (ADVIL,MOTRIN) 200 MG tablet Take 400 mg by mouth daily as needed for headache or moderate pain.    . Melatonin 5 MG TABS Take 5 mg by mouth at bedtime.    . [DISCONTINUED] citalopram (CELEXA) 40 MG tablet Take 40 mg by mouth daily.     No current facility-administered medications on file prior to visit.     Past Surgical History:  Procedure Laterality Date  . BREAST SURGERY     REDUCTI0N  . KNEE ARTHROSCOPY Left   . TONSILLECTOMY      No Known Allergies  Social History   Socioeconomic History  . Marital status: Single    Spouse name: Not on file  . Number of children: Not on file  . Years of education: Not on file  . Highest education level: Not on file  Social Needs  . Financial resource strain: Not on file  . Food insecurity  - worry: Not on file  . Food insecurity - inability: Not on file  . Transportation needs - medical: Not on file  . Transportation needs - non-medical: Not on file  Occupational History  . Not on file  Tobacco Use  . Smoking status: Former Smoker    Types: Cigarettes    Last attempt to quit: 12/07/2015    Years since quitting: 1.4  . Smokeless tobacco: Never Used  Substance and Sexual Activity  . Alcohol use: Yes    Comment: occasional beer  . Drug use: No  . Sexual activity: Yes    Birth control/protection: Implant, Condom  Other Topics Concern  . Not on file  Social History Narrative  . Not on file    Family History  Problem Relation Age of Onset  . Diabetes Father     BP 114/73   Pulse (!) 125   Review of Systems: See HPI above.     Objective:  Physical Exam:  Gen: NAD, comfortable in exam room.  Right ankle/foot: Localized swollen areas - proximally over proximal tibia and another one proximal to insertion of achilles insertion on calcaneus.  No redness, bruising.  Firm to the touch. FROM with 5/5 strength - some superficial pain in posterior area with full dorsiflexion of  ankle. TTP of both areas noted above. Negative ant drawer and talar tilt.   Negative syndesmotic compression. Thompsons test negative. NV intact distally.  Left ankle: FROM without pain.  No lesions.  MSK u/s right leg: Swollen areas consistent with soft tissue swelling but no discrete mass and no neovascularity.  Tibia and achilles deep to these are normal.    Assessment & Plan:  1. Right leg pain - two superficial nodules noted now but ultrasound is reassuring that these are not abscesses or masses.  Concern these represent presentation of erythema nodosum.  Reassured no orthopedic concerns.  Despite no redness given there is tenderness as a precaution will place her on keflex qid for 7 days.  Refer to dermatology.

## 2017-05-19 NOTE — Addendum Note (Signed)
Addended by: Kathi SimpersWISE, Hale Chalfin F on: 05/19/2017 08:32 AM   Modules accepted: Orders

## 2017-05-21 ENCOUNTER — Telehealth: Payer: Self-pay | Admitting: Medical

## 2017-05-21 ENCOUNTER — Emergency Department (HOSPITAL_BASED_OUTPATIENT_CLINIC_OR_DEPARTMENT_OTHER)
Admission: EM | Admit: 2017-05-21 | Discharge: 2017-05-21 | Disposition: A | Discharge status: Home / Self Care | Payer: No Typology Code available for payment source | Attending: Emergency Medicine | Admitting: Emergency Medicine

## 2017-05-21 ENCOUNTER — Other Ambulatory Visit: Payer: Self-pay

## 2017-05-21 ENCOUNTER — Encounter (HOSPITAL_BASED_OUTPATIENT_CLINIC_OR_DEPARTMENT_OTHER): Payer: Self-pay | Admitting: Emergency Medicine

## 2017-05-21 DIAGNOSIS — Z5321 Procedure and treatment not carried out due to patient leaving prior to being seen by health care provider: Secondary | ICD-10-CM | POA: Insufficient documentation

## 2017-05-21 DIAGNOSIS — M79604 Pain in right leg: Secondary | ICD-10-CM | POA: Insufficient documentation

## 2017-05-21 NOTE — Telephone Encounter (Signed)
Copied from CRM [phone]. Topic: Quick Communication - See Telephone Encounter >> May 21, 2017  4:45 PM Valentina LucksMatos, Jackelin wrote: CRM for notification. See Telephone encounter for:  05/21/17.   Pt came in office stating received a message on mychart about getting a rx for Phentermine (for weight loss) Pt would like to get rx from provider. Please advise.

## 2017-05-21 NOTE — ED Triage Notes (Signed)
Patient states that she has had pain to her right lower leg and ankle region x months. The patient has gone to the MD and was treated several times. THe patient reports that it is painful to touch and she is at her "wits end" with what this could be

## 2017-05-24 ENCOUNTER — Encounter: Payer: Self-pay | Admitting: Medical

## 2017-05-24 ENCOUNTER — Ambulatory Visit (INDEPENDENT_AMBULATORY_CARE_PROVIDER_SITE_OTHER): Payer: No Typology Code available for payment source | Admitting: Medical

## 2017-05-24 VITALS — BP 108/64 | HR 74 | Temp 98.3°F | Resp 16 | Ht 67.0 in | Wt 335.6 lb

## 2017-05-24 DIAGNOSIS — L52 Erythema nodosum: Secondary | ICD-10-CM | POA: Diagnosis not present

## 2017-05-24 MED ORDER — PHENTERMINE HCL 15 MG PO CAPS: 15.0000 mg | ORAL_CAPSULE | ORAL | 0 refills | Status: DC

## 2017-05-24 MED ORDER — DICLOFENAC SODIUM 1 % TD GEL: 2.0000 g | Freq: Four times a day (QID) | TRANSDERMAL | 0 refills | Status: DC

## 2017-05-24 NOTE — Patient Instructions (Addendum)
For your erythema nodosum will give you voltaren gel to apply 2-4 times daily to area. Also will refer you to dermatologist.   For obesity, Will rx phentermine and recommend diet and exercise. Need to make sure losing weight with med and no adverse side effects. Advised  standard conservative measures to lose weight.   Follow up in one month or as needed

## 2017-05-24 NOTE — Progress Notes (Signed)
Subjective:    Patient ID: Kaitlyn Luna, female    DOB: 1990-09-06, 27 y.o.   MRN: [id number]  HPI  Pt in for follow up.  Pt went to Dr Pearletha Forge. Pt still has area of swelling over over achilles tendon area. 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 Pt states Dr. Pearletha Forge did give antibiotic but counseled erythema nodosum per pt. Also she state he mentioned dermatologist evaluation.  She went to the ED the other day but left stating could not wait so she left.    LMP- pt has some irregular cycles. She is getting some spotting. Abnormal cells and getting cononization. Pt is on implanon.    Review of Systems  Constitutional: Negative for chills, fatigue and fever.  Respiratory: Negative for chest tightness and wheezing.   Cardiovascular: Negative for chest pain and palpitations.  Musculoskeletal: Negative for back pain, gait problem and neck stiffness.  Skin: Negative for rash.       See hpi.  Neurological: Negative for dizziness, seizures, speech difficulty, weakness, light-headedness and numbness.  Hematological: Negative for adenopathy. Does not bruise/bleed easily.  Psychiatric/Behavioral: Negative for behavioral problems, confusion and dysphoric mood. The patient is not nervous/anxious and is not hyperactive.    Past Medical History:  Diagnosis Date  . Anemia   . Anxiety   . Asthma   . Bipolar depression (HCC)   . Chronic kidney disease    UTI AND FREQUENT KIDNEY INFECTIONS FREQUENTLY  . Depression   . Pneumonia 2017  . PTSD (post-traumatic stress disorder)   . Sleep apnea    not using c-pap     Social History   Socioeconomic History  . Marital status: Single    Spouse name: Not on file  . Number of children: Not on file  . Years of education: Not on file  . Highest education level: Not on file  Social Needs  . Financial resource strain: Not on file  . Food insecurity - worry: Not on file  . Food insecurity - inability: Not on  file  . Transportation needs - medical: Not on file  . Transportation needs - non-medical: Not on file  Occupational History  . Not on file  Tobacco Use  . Smoking status: Former Smoker    Types: Cigarettes    Last attempt to quit: 12/07/2015    Years since quitting: 1.4  . Smokeless tobacco: Never Used  Substance and Sexual Activity  . Alcohol use: Yes    Comment: occasional beer  . Drug use: No  . Sexual activity: Yes    Birth control/protection: Implant, Condom  Other Topics Concern  . Not on file  Social History Narrative  . Not on file    Past Surgical History:  Procedure Laterality Date  . BREAST SURGERY     REDUCTI0N  . KNEE ARTHROSCOPY Left   . TONSILLECTOMY      Family History  Problem Relation Age of Onset  . Diabetes Father     No Known Allergies  Current Outpatient Medications on File Prior to Visit  Medication Sig Dispense Refill  . cephALEXin (KEFLEX) 500 MG capsule Take 1 capsule (500 mg total) by mouth 4 (four) times daily. 28 capsule 0  . Etonogestrel (IMPLANON [place]) Inject into the skin once.    Marland Kitchen ibuprofen (ADVIL,MOTRIN) 200 MG tablet Take 400 mg by mouth daily as needed for headache or moderate pain.    . Melatonin 5 MG TABS Take 5  mg by mouth at bedtime.    . [DISCONTINUED] citalopram (CELEXA) 40 MG tablet Take 40 mg by mouth daily.     No current facility-administered medications on file prior to visit.          Objective:   Physical Exam   General Mental Status- Alert. General Appearance- Not in acute distress.   Skin General: Color- Normal Color. Moisture- Normal Moisture.  Neck Carotid Arteries- Normal color. Moisture- Normal Moisture. No carotid bruits. No JVD.  Chest and Lung Exam Auscultation: Breath Sounds:-Normal.  Cardiovascular Auscultation:Rythm- Regular. Murmurs & Other Heart Sounds:Auscultation of the heart reveals- No Murmurs.  Abdomen Inspection:-Inspeection Normal. Palpation/Percussion:Note:No mass.  Palpation and Percussion of the abdomen reveal- Non Tender, Non Distended + BS, no rebound or guarding.  Rt lower ext- anterior upper pretibial area. Small raised area. Possible erythema  Nodosum. Medial and lateral achilles tendon on both sides of tendon small raised area. Feels like lipoma.(Dr. Hudnall thinks erythema nodosum by US)   Neurologic Cranial Nerve exam:- CN III-XII intact(No nystagmus), symmetric smile. Strength:- 5/5 equal and symmetric strength both upper and lower extremities.     Assessment & Plan:  For your erythema nodosum will give you voltaren gel to apply 2-4 times daily to area. Also will refer you to dermatologist.   For obesity, Will rx phentermine and recommend diet and exercise. Need to make sure losing weight with med and no adverse side effects. Advised standard conservative measures to lose weight.   Follow up in one month or as needed  Masahiro Iglesia, Ramon DredgeEdward, [place] JerseyPA-C

## 2017-05-27 ENCOUNTER — Encounter (HOSPITAL_COMMUNITY)
Admission: AD | Disposition: A | Discharge status: Home / Self Care | Payer: Self-pay | Source: Ambulatory Visit | Attending: Obstetrics & Gynecology

## 2017-05-27 ENCOUNTER — Encounter (HOSPITAL_COMMUNITY): Payer: Self-pay | Admitting: *Deleted

## 2017-05-27 ENCOUNTER — Ambulatory Visit (HOSPITAL_COMMUNITY): Payer: No Typology Code available for payment source | Admitting: Anesthesiology

## 2017-05-27 ENCOUNTER — Ambulatory Visit (HOSPITAL_COMMUNITY)
Admission: AD | Admit: 2017-05-27 | Discharge: 2017-05-27 | Disposition: A | Discharge status: Home / Self Care | Payer: No Typology Code available for payment source | Source: Ambulatory Visit | Attending: Obstetrics & Gynecology | Admitting: Obstetrics & Gynecology

## 2017-05-27 DIAGNOSIS — R87613 High grade squamous intraepithelial lesion on cytologic smear of cervix (HGSIL): Secondary | ICD-10-CM | POA: Diagnosis present

## 2017-05-27 DIAGNOSIS — Z9689 Presence of other specified functional implants: Secondary | ICD-10-CM | POA: Insufficient documentation

## 2017-05-27 DIAGNOSIS — R87612 Low grade squamous intraepithelial lesion on cytologic smear of cervix (LGSIL): Secondary | ICD-10-CM

## 2017-05-27 DIAGNOSIS — G4730 Sleep apnea, unspecified: Secondary | ICD-10-CM | POA: Diagnosis not present

## 2017-05-27 DIAGNOSIS — Z79899 Other long term (current) drug therapy: Secondary | ICD-10-CM | POA: Diagnosis not present

## 2017-05-27 DIAGNOSIS — Z793 Long term (current) use of hormonal contraceptives: Secondary | ICD-10-CM | POA: Diagnosis not present

## 2017-05-27 DIAGNOSIS — Z87891 Personal history of nicotine dependence: Secondary | ICD-10-CM | POA: Diagnosis not present

## 2017-05-27 DIAGNOSIS — Z6841 Body Mass Index (BMI) 40.0 and over, adult: Secondary | ICD-10-CM | POA: Insufficient documentation

## 2017-05-27 DIAGNOSIS — Z87440 Personal history of urinary (tract) infections: Secondary | ICD-10-CM | POA: Insufficient documentation

## 2017-05-27 DIAGNOSIS — F319 Bipolar disorder, unspecified: Secondary | ICD-10-CM | POA: Insufficient documentation

## 2017-05-27 DIAGNOSIS — N871 Moderate cervical dysplasia: Secondary | ICD-10-CM | POA: Insufficient documentation

## 2017-05-27 DIAGNOSIS — F4310 Post-traumatic stress disorder, unspecified: Secondary | ICD-10-CM | POA: Diagnosis not present

## 2017-05-27 HISTORY — DX: Chronic kidney disease, unspecified: N18.9

## 2017-05-27 HISTORY — DX: Anemia, unspecified: D64.9

## 2017-05-27 HISTORY — DX: Sleep apnea, unspecified: G47.30

## 2017-05-27 HISTORY — PX: CERVICAL CONIZATION W/BX: SHX1330

## 2017-05-27 HISTORY — DX: Pneumonia, unspecified organism: J18.9

## 2017-05-27 LAB — PREGNANCY, URINE: PREG TEST UR: NEGATIVE

## 2017-05-27 SURGERY — CONE BIOPSY, CERVIX
Anesthesia: General | Site: Vagina

## 2017-05-27 MED ORDER — ROCURONIUM BROMIDE 100 MG/10ML IV SOLN
INTRAVENOUS | Status: AC
  Filled 2017-05-27: qty 1

## 2017-05-27 MED ORDER — PROPOFOL 10 MG/ML IV BOLUS
INTRAVENOUS | Status: AC
  Filled 2017-05-27: qty 40

## 2017-05-27 MED ORDER — PROMETHAZINE HCL 25 MG/ML IJ SOLN: 6.2500 mg | INTRAMUSCULAR | Status: DC | PRN

## 2017-05-27 MED ORDER — ONDANSETRON HCL 4 MG/2ML IJ SOLN
INTRAMUSCULAR | Status: AC
  Filled 2017-05-27: qty 2

## 2017-05-27 MED ORDER — ACETIC ACID 5 % SOLN
Status: AC
  Filled 2017-05-27: qty 500

## 2017-05-27 MED ORDER — SCOPOLAMINE 1 MG/3DAYS TD PT72
MEDICATED_PATCH | TRANSDERMAL | Status: AC
  Filled 2017-05-27: qty 1

## 2017-05-27 MED ORDER — SCOPOLAMINE 1 MG/3DAYS TD PT72
MEDICATED_PATCH | TRANSDERMAL | Status: AC
  Administered 2017-05-27: 1.5 mg via TRANSDERMAL
  Filled 2017-05-27: qty 1

## 2017-05-27 MED ORDER — MIDAZOLAM HCL 2 MG/2ML IJ SOLN
INTRAMUSCULAR | Status: AC
  Filled 2017-05-27: qty 2

## 2017-05-27 MED ORDER — LACTATED RINGERS IV SOLN
INTRAVENOUS | Status: DC
  Administered 2017-05-27 (×2): via INTRAVENOUS

## 2017-05-27 MED ORDER — FENTANYL CITRATE (PF) 250 MCG/5ML IJ SOLN
INTRAMUSCULAR | Status: AC
  Filled 2017-05-27: qty 5

## 2017-05-27 MED ORDER — ACETIC ACID 4% SOLUTION
Status: DC | PRN
Start: 2017-05-27 — End: 2017-05-27
  Administered 2017-05-27: 1 via TOPICAL

## 2017-05-27 MED ORDER — FENTANYL CITRATE (PF) 100 MCG/2ML IJ SOLN
INTRAMUSCULAR | Status: AC
  Filled 2017-05-27: qty 2

## 2017-05-27 MED ORDER — EPHEDRINE SULFATE 50 MG/ML IJ SOLN
INTRAMUSCULAR | Status: DC | PRN
  Administered 2017-05-27: 80 mg via INTRAVENOUS
  Administered 2017-05-27: 15 mg via INTRAVENOUS
  Administered 2017-05-27: 10 mg via INTRAVENOUS

## 2017-05-27 MED ORDER — SCOPOLAMINE 1 MG/3DAYS TD PT72
1.0000 | MEDICATED_PATCH | Freq: Once | TRANSDERMAL | Status: DC
  Administered 2017-05-27: 1.5 mg via TRANSDERMAL

## 2017-05-27 MED ORDER — OXYCODONE-ACETAMINOPHEN 5-325 MG PO TABS: 1.0000 | ORAL_TABLET | Freq: Four times a day (QID) | ORAL | 0 refills | Status: DC | PRN

## 2017-05-27 MED ORDER — LIDOCAINE HCL (CARDIAC) 20 MG/ML IV SOLN
INTRAVENOUS | Status: AC
  Filled 2017-05-27: qty 5

## 2017-05-27 MED ORDER — SUCCINYLCHOLINE CHLORIDE 200 MG/10ML IV SOSY
PREFILLED_SYRINGE | INTRAVENOUS | Status: AC
  Filled 2017-05-27: qty 10

## 2017-05-27 MED ORDER — BUPIVACAINE HCL (PF) 0.5 % IJ SOLN
INTRAMUSCULAR | Status: AC
  Filled 2017-05-27: qty 30

## 2017-05-27 MED ORDER — PHENYLEPHRINE 40 MCG/ML (10ML) SYRINGE FOR IV PUSH (FOR BLOOD PRESSURE SUPPORT)
PREFILLED_SYRINGE | INTRAVENOUS | Status: AC
  Filled 2017-05-27: qty 10

## 2017-05-27 MED ORDER — PROMETHAZINE HCL 25 MG/ML IJ SOLN
INTRAMUSCULAR | Status: AC
  Filled 2017-05-27: qty 1

## 2017-05-27 MED ORDER — EPHEDRINE 5 MG/ML INJ
INTRAVENOUS | Status: AC
Start: 2017-05-27 — End: 2017-05-27
  Filled 2017-05-27: qty 10

## 2017-05-27 MED ORDER — PROPOFOL 10 MG/ML IV BOLUS
INTRAVENOUS | Status: DC | PRN
  Administered 2017-05-27: 200 mg via INTRAVENOUS

## 2017-05-27 MED ORDER — IBUPROFEN 600 MG PO TABS: 600.0000 mg | ORAL_TABLET | Freq: Four times a day (QID) | ORAL | 1 refills | Status: DC | PRN

## 2017-05-27 MED ORDER — KETOROLAC TROMETHAMINE 30 MG/ML IJ SOLN: 30.0000 mg | Freq: Once | INTRAMUSCULAR | Status: DC | PRN

## 2017-05-27 MED ORDER — MIDAZOLAM HCL 2 MG/2ML IJ SOLN
INTRAMUSCULAR | Status: DC | PRN
  Administered 2017-05-27: 0.5 mg via INTRAVENOUS

## 2017-05-27 MED ORDER — BUPIVACAINE HCL (PF) 0.5 % IJ SOLN
INTRAMUSCULAR | Status: DC | PRN
  Administered 2017-05-27: 10 mL

## 2017-05-27 MED ORDER — GLYCOPYRROLATE 0.2 MG/ML IJ SOLN
INTRAMUSCULAR | Status: DC | PRN
  Administered 2017-05-27: 0.1 mg via INTRAVENOUS
  Administered 2017-05-27: 0.2 mg via INTRAVENOUS

## 2017-05-27 MED ORDER — SUCCINYLCHOLINE CHLORIDE 20 MG/ML IJ SOLN
INTRAMUSCULAR | Status: DC | PRN
  Administered 2017-05-27: 200 mg via INTRAVENOUS

## 2017-05-27 MED ORDER — FERRIC SUBSULFATE 259 MG/GM EX SOLN
CUTANEOUS | Status: AC
Start: 2017-05-27 — End: 2017-05-27
  Filled 2017-05-27: qty 8

## 2017-05-27 MED ORDER — METOCLOPRAMIDE HCL 5 MG/ML IJ SOLN
INTRAMUSCULAR | Status: AC
  Filled 2017-05-27: qty 2

## 2017-05-27 MED ORDER — IODINE STRONG (LUGOLS) 5 % PO SOLN
ORAL | Status: AC
  Filled 2017-05-27: qty 1

## 2017-05-27 MED ORDER — PROPOFOL 10 MG/ML IV BOLUS
INTRAVENOUS | Status: AC
  Filled 2017-05-27: qty 20

## 2017-05-27 MED ORDER — ONDANSETRON HCL 4 MG/2ML IJ SOLN
INTRAMUSCULAR | Status: DC | PRN
  Administered 2017-05-27: 4 mg via INTRAVENOUS

## 2017-05-27 MED ORDER — PHENYLEPHRINE HCL 10 MG/ML IJ SOLN
INTRAMUSCULAR | Status: DC | PRN
  Administered 2017-05-27 (×3): 80 ug via INTRAVENOUS

## 2017-05-27 MED ORDER — FENTANYL CITRATE (PF) 100 MCG/2ML IJ SOLN
INTRAMUSCULAR | Status: DC | PRN
  Administered 2017-05-27 (×2): 50 ug via INTRAVENOUS

## 2017-05-27 MED ORDER — METOCLOPRAMIDE HCL 5 MG/ML IJ SOLN
INTRAMUSCULAR | Status: DC | PRN
  Administered 2017-05-27: 10 mg via INTRAVENOUS

## 2017-05-27 MED ORDER — DEXAMETHASONE SODIUM PHOSPHATE 4 MG/ML IJ SOLN
INTRAMUSCULAR | Status: AC
  Filled 2017-05-27: qty 1

## 2017-05-27 MED ORDER — FENTANYL CITRATE (PF) 100 MCG/2ML IJ SOLN
25.0000 ug | INTRAMUSCULAR | Status: DC | PRN
  Administered 2017-05-27: 50 ug via INTRAVENOUS

## 2017-05-27 SURGICAL SUPPLY — 26 items
BLADE SURG 11 STRL SS (BLADE) ×3 IMPLANT
DILATOR CANAL MILEX (MISCELLANEOUS) IMPLANT
ELECT BLADE 6.5 EXT (BLADE) ×2 IMPLANT
ELECT REM PT RETURN 9FT ADLT (ELECTROSURGICAL) ×3
ELECTRODE REM PT RTRN 9FT ADLT (ELECTROSURGICAL) ×1 IMPLANT
GLOVE BIO SURGEON STRL SZ 6.5 (GLOVE) ×2 IMPLANT
GLOVE BIO SURGEONS STRL SZ 6.5 (GLOVE) ×1
GLOVE BIOGEL PI IND STRL 6 (GLOVE) ×1 IMPLANT
GLOVE BIOGEL PI IND STRL 7.0 (GLOVE) ×1 IMPLANT
GLOVE BIOGEL PI INDICATOR 6 (GLOVE) ×2
GLOVE BIOGEL PI INDICATOR 7.0 (GLOVE) ×2
GOWN STRL REUS W/TWL LRG LVL3 (GOWN DISPOSABLE) ×6 IMPLANT
NDL SPNL 18GX3.5 QUINCKE PK (NEEDLE) ×1 IMPLANT
NEEDLE SPNL 18GX3.5 QUINCKE PK (NEEDLE) ×3 IMPLANT
PACK VAGINAL MINOR WOMEN LF (CUSTOM PROCEDURE TRAY) ×3 IMPLANT
PAD OB MATERNITY 4.3X12.25 (PERSONAL CARE ITEMS) ×3 IMPLANT
PENCIL BUTTON HOLSTER BLD 10FT (ELECTRODE) ×3 IMPLANT
SCOPETTES 8  STERILE (MISCELLANEOUS) ×2
SCOPETTES 8 STERILE (MISCELLANEOUS) ×1 IMPLANT
SPONGE SURGIFOAM ABS GEL 12-7 (HEMOSTASIS) ×2 IMPLANT
SUT VIC AB 0 CT1 27 (SUTURE) ×6
SUT VIC AB 0 CT1 27XBRD ANBCTR (SUTURE) IMPLANT
TOWEL OR 17X24 6PK STRL BLUE (TOWEL DISPOSABLE) ×3 IMPLANT
TUBING NON-CON 1/4 X 20 CONN (TUBING) ×2 IMPLANT
TUBING NON-CON 1/4 X 20' CONN (TUBING) ×1
YANKAUER SUCT BULB TIP NO VENT (SUCTIONS) ×3 IMPLANT

## 2017-05-27 NOTE — Transfer of Care (Signed)
Immediate Anesthesia Transfer of Care Note  Patient: Kaitlyn Luna  Procedure(s) Performed: CONIZATION CERVIX WITH BIOPSY - COLD KNIFE (N/A Vagina )  Patient Location: PACU  Anesthesia Type:General  Level of Consciousness: awake, alert  and oriented  Airway & Oxygen Therapy: Patient Spontanous Breathing and Patient connected to nasal cannula oxygen  Post-op Assessment: Report given to RN and Post -op Vital signs reviewed and stable HR 106, RR 20, SaO2 98%, BP 125/63  Post vital signs: Reviewed and stable  Last Vitals:  Vitals:   05/27/17 0933 05/27/17 0940  BP:  104/77  Pulse: 71   Resp: 18   Temp: 36.8 C   SpO2: 97%     Last Pain:  Vitals:   05/27/17 0933  TempSrc: Oral      Patients Stated Pain Goal: 5 (05/27/17 1039)  Complications: No apparent anesthesia complications

## 2017-05-27 NOTE — Discharge Instructions (Addendum)
Cervical Conization, Care After °This sheet gives you information about how to care for yourself after your procedure. Your doctor may also give you more specific instructions. If you have problems or questions, contact your doctor. °Follow these instructions at home: °Medicines °· Take over-the-counter and prescription medicines only as told by your doctor. °· Do not take aspirin until your doctor says it is okay. °· If you take pain medicine: °? You may have constipation. To help treat this, your doctor may tell you to: °§ Drink enough fluid to keep your pee (urine) clear or pale yellow. °§ Take medicines. °§ Eat foods that are high in fiber. These include fresh fruits and vegetables, whole grains, bran, and beans. °§ Limit foods that are high in fat and sugar. These include fried foods and sweet foods. °? Do not drive or use heavy machines. °General instructions °· You can eat your usual diet unless your doctor tells you not to do so. °· Take showers for the first week. Do not take baths, swim, or use hot tubs until your doctor says it is okay. °· Do not douche, use tampons, or have sex until your doctor says it is okay. °· For 7-14 days after your procedure, avoid: °? Being very active. °? Exercising. °? Heavy lifting. °· Keep all follow-up visits as told by your doctor. This is important. °Contact a doctor if: °· You have a rash. °· You are dizzy or lightheaded. °· You feel sick to your stomach (nauseous). °· You throw up (vomit). °· You have fluid from your vagina (vaginal discharge) that smells bad. °Get help right away if: °· There are blood clots coming from your vagina. °· You have more bleeding than you would have in a normal period. For example, you soak a pad in less than 1 hour. °· You have a fever. °· You have more and more cramps. °· You pass out (faint). °· You have pain when peeing. °· Your have a lot of pain. °· Your pain gets worse. °· Your pain does not get better when you take your  medicine. °· You have blood in your pee. °· You throw up (vomit). °Summary °· After your procedure, take over-the-counter and prescription medicines only as told by your doctor. °· Do not douche, use tampons, or have sex until your doctor says it is okay. °· For about 7-14 days after your procedure, try not to exercise or lift heavy objects. °· Get help right away if you have new symptoms, or if your symptoms become worse. °This information is not intended to replace advice given to you by your health care provider. Make sure you discuss any questions you have with your health care provider. °Document Released: 02/04/2008 Document Revised: 04/29/2016 Document Reviewed: 04/29/2016 °Elsevier Interactive Patient Education © 2017 [employer] ° ° °Post Anesthesia Home Care Instructions ° °Activity: °Get plenty of rest for the remainder of the day. A responsible individual must stay with you for 24 hours following the procedure.  °For the next 24 hours, DO NOT: °-Drive a car °-Operate machinery °-Drink alcoholic beverages °-Take any medication unless instructed by your physician °-Make any legal decisions or sign important papers. ° °Meals: °Start with liquid foods such as gelatin or soup. Progress to regular foods as tolerated. Avoid greasy, spicy, heavy foods. If nausea and/or vomiting occur, drink only clear liquids until the nausea and/or vomiting subsides. Call your physician if vomiting continues. ° °Special Instructions/Symptoms: °Your throat may feel dry or sore from   the anesthesia or the breathing tube placed in your throat during surgery. If this causes discomfort, gargle with warm salt water. The discomfort should disappear within 24 hours. ° °If you had a scopolamine patch placed behind your ear for the management of post- operative nausea and/or vomiting: ° °1. The medication in the patch is effective for 72 hours, after which it should be removed.  Wrap patch in a tissue and discard in the trash. Wash  hands thoroughly with soap and water. °2. You may remove the patch earlier than 72 hours if you experience unpleasant side effects which may include dry mouth, dizziness or visual disturbances. °3. Avoid touching the patch. Wash your hands with soap and water after contact with the patch. °  ° ° °

## 2017-05-27 NOTE — H&P (Signed)
Kaitlyn Luna is an 27 y.o. female. Single G0 here for a CKC. She had a LGSIL pap and then a  colpo. 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     No LMP recorded. Patient has had an implant.    Past Medical History:  Diagnosis Date  . Anemia   . Anxiety   . Asthma   . Bipolar depression (HCC)   . Chronic kidney disease    UTI AND FREQUENT KIDNEY INFECTIONS FREQUENTLY  . Depression   . Pneumonia 2017  . PTSD (post-traumatic stress disorder)   . Sleep apnea    not using c-pap    Past Surgical History:  Procedure Laterality Date  . BREAST SURGERY     REDUCTI0N  . KNEE ARTHROSCOPY Left   . TONSILLECTOMY      Family History  Problem Relation Age of Onset  . Diabetes Father     Social History:  reports that she quit smoking about 17 months ago. Her smoking use included cigarettes. she has never used smokeless tobacco. She reports that she drinks alcohol. She reports that she does not use drugs.  Allergies: No Known Allergies  Medications Prior to Admission  Medication Sig Dispense Refill Last Dose  . cephALEXin (KEFLEX) 500 MG capsule Take 1 capsule (500 mg total) by mouth 4 (four) times daily. 28 capsule 0 05/22/2017  . diclofenac sodium (VOLTAREN) 1 % GEL Apply 2 g topically 4 (four) times daily. 100 g 0   . Etonogestrel (IMPLANON [place]) Inject into the skin once.   01/25/2017  . ibuprofen (ADVIL,MOTRIN) 200 MG tablet Take 400 mg by mouth daily as needed for headache or moderate pain.   Taking  . Melatonin 5 MG TABS Take 5 mg by mouth at bedtime.   05/26/2017 at Unknown time  . phentermine 15 MG capsule Take 1 capsule (15 mg total) by mouth every morning. 30 capsule 0     ROS  She uses Nexplanon for contraception.  Blood pressure 104/77, pulse 71, temperature 98.2 F (36.8 C), temperature source Oral, resp. rate 18, height 5\' 7"  (1.702 m), weight (!) 152 kg (335 lb), SpO2 97 %. Physical Exam  Breathing, conversing, and ambulating  normally Well nourished, well hydrated White female, no apparent distress Abd- obese, benign Heart- rrr Lungs- CTAB  Results for orders placed or performed during the hospital encounter of 05/27/17 (from the past 24 hour(s))  Pregnancy, urine     Status: None   Collection Time: 05/27/17  8:45 AM  Result Value Ref Range   Preg Test, Ur NEGATIVE NEGATIVE    No results found.  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  She understands the risks of surgery, including, but not to infection, bleeding, DVTs, damage to bowel, bladder, ureters. She wishes to proceed.     Kaitlyn Luna 05/27/2017, 12:42 PM

## 2017-05-27 NOTE — Anesthesia Preprocedure Evaluation (Signed)
Anesthesia Evaluation  Patient identified by MRN, date of birth, ID band Patient awake    Reviewed: Allergy & Precautions, NPO status , Patient's Chart, lab work & pertinent test results  Airway Mallampati: II  TM Distance: >3 FB Neck ROM: Full    Dental no notable dental hx.    Pulmonary asthma , sleep apnea , former smoker,    Pulmonary exam normal breath sounds clear to auscultation + decreased breath sounds      Cardiovascular negative cardio ROS Normal cardiovascular exam Rhythm:Regular Rate:Normal     Neuro/Psych Bipolar Disorder negative neurological ROS     GI/Hepatic negative GI ROS, Neg liver ROS,   Endo/Other  Morbid obesity  Renal/GU negative Renal ROS  negative genitourinary   Musculoskeletal negative musculoskeletal ROS (+)   Abdominal (+) + obese,   Peds negative pediatric ROS (+)  Hematology negative hematology ROS (+)   Anesthesia Other Findings   Reproductive/Obstetrics negative OB ROS                             Anesthesia Physical Anesthesia Plan  ASA: III  Anesthesia Plan: General   Post-op Pain Management:    Induction: Intravenous  PONV Risk Score and Plan: 2 and Ondansetron, Dexamethasone and Treatment may vary due to age or medical condition  Airway Management Planned: Oral ETT  Additional Equipment:   Intra-op Plan:   Post-operative Plan: Extubation in OR  Informed Consent: I have reviewed the patients History and Physical, chart, labs and discussed the procedure including the risks, benefits and alternatives for the proposed anesthesia with the patient or authorized representative who has indicated his/her understanding and acceptance.   Dental advisory given  Plan Discussed with: CRNA and Surgeon  Anesthesia Plan Comments:         Anesthesia Quick Evaluation

## 2017-05-27 NOTE — Anesthesia Postprocedure Evaluation (Signed)
Anesthesia Post Note  Patient: Kaitlyn Luna  Procedure(s) Performed: CONIZATION CERVIX WITH BIOPSY - COLD KNIFE (N/A Vagina )     Patient location during evaluation: PACU Anesthesia Type: General Level of consciousness: awake and alert Pain management: pain level controlled Vital Signs Assessment: post-procedure vital signs reviewed and stable Respiratory status: spontaneous breathing, nonlabored ventilation, respiratory function stable and patient connected to nasal cannula oxygen Cardiovascular status: blood pressure returned to baseline and stable Postop Assessment: no apparent nausea or vomiting Anesthetic complications: no    Last Vitals:  Vitals:   05/27/17 0933 05/27/17 0940  BP:  104/77  Pulse: 71   Resp: 18   Temp: 36.8 C   SpO2: 97%     Last Pain:  Vitals:   05/27/17 0933  TempSrc: Oral                 Averil Digman

## 2017-05-27 NOTE — Anesthesia Procedure Notes (Signed)
Procedure Name: Intubation Date/Time: 05/27/2017 2:25 PM Performed by: Flossie Dibble, CRNA Pre-anesthesia Checklist: Patient identified, Patient being monitored, Timeout performed, Emergency Drugs available and Suction available Patient Re-evaluated:Patient Re-evaluated prior to induction Oxygen Delivery Method: Circle System Utilized Preoxygenation: Pre-oxygenation with 100% oxygen Induction Type: IV induction Ventilation: Mask ventilation without difficulty Laryngoscope Size: Mac and 3 Grade View: Grade I Tube type: Oral Tube size: 7.0 mm Number of attempts: 1 Airway Equipment and Method: stylet,  Patient positioned with wedge pillow and Stylet Placement Confirmation: ETT inserted through vocal cords under direct vision,  positive ETCO2 and breath sounds checked- equal and bilateral Secured at: 21 cm Tube secured with: Tape Dental Injury: Teeth and Oropharynx as per pre-operative assessment

## 2017-05-27 NOTE — Op Note (Signed)
05/27/2017  2:53 PM  PATIENT:  Kaitlyn Luna  27 y.o. female  PRE-OPERATIVE DIAGNOSIS:  HGSIL WITH POSITIVE ECC  POST-OPERATIVE DIAGNOSIS:  HGSIL WITH POSITIVE ECC  PROCEDURE:  Procedure(s): CONIZATION CERVIX WITH BIOPSY - COLD KNIFE (N/A)  SURGEON:  Surgeon(s) and Role:    * Cinthia Rodden, Leanora IvanoffMyra C, MD - Primary   ANESTHESIA:   local and general  EBL:  minimal   BLOOD ADMINISTERED:Dragon Dictation  DRAINS: none   LOCAL MEDICATIONS USED:  MARCAINE     SPECIMEN:  Source of Specimen:  cone biopsy and ECC  DISPOSITION OF SPECIMEN:  PATHOLOGY  COUNTS:  YES  TOURNIQUET:  * No tourniquets in log *  DICTATION: .Dragon Dictation  PLAN OF CARE: Discharge to home after PACU  PATIENT DISPOSITION:  PACU - hemodynamically stable.   Delay start of Pharmacological VTE agent (>24hrs) due to surgical blood loss or risk of bleeding: not   The risks, benefits, and alternatives of surgery were explained, understood, and accepted. She was taken to the operating room and placed in the dorsal lithotomy position. Her vagina was prepped and draped in the usual sterile fashion. A timeout was done. A bimanual exam revealed a normal size and shape, anteverted mobile uterus. Nonenlarged adnexa were noted.  A long narrow weighted speculum was placed in the posterior aspect the vagina and the cervix was visualized. Anterior lip of the cervix was grasped with a single-tooth tenaculum, and a paracervical block was done with 10 mL of 0.5% Marcaine. A cone shaped specimen of the cervix was removed and endocervical curettings were obtained. The cone bed was cauterized with the Bovie. Hemostasis was noted. A pursestring closure of the cervix was done with a 2-0 Vicryl suture. Excellent hemostasis was noted. The instrument sponge, and needle counts were correct. She was taken to the recovery room in stable condition. She tolerated the procedure well.

## 2017-05-28 ENCOUNTER — Telehealth: Payer: Self-pay | Admitting: Medical

## 2017-05-28 ENCOUNTER — Encounter (HOSPITAL_COMMUNITY): Payer: Self-pay | Admitting: Obstetrics & Gynecology

## 2017-05-28 NOTE — Telephone Encounter (Signed)
Copied from CRM [phone]. Topic: Referral - Question >> May 27, 2017  8:46 AM Oneal GroutSebastian, Jennifer S wrote: Reason for CRM: [facility] they do not accept patients insurance  Referral was sent to [facility]

## 2017-06-09 ENCOUNTER — Telehealth: Payer: Self-pay | Admitting: Pediatrics

## 2017-06-09 NOTE — Telephone Encounter (Signed)
Pt called with questions about vaginal discharge after colpo.  I called pt back and left voicemail for return call.

## 2017-06-10 NOTE — Telephone Encounter (Signed)
LMTCB

## 2017-06-20 IMAGING — DX DG CHEST 2V
2 series · 2 of 2 positions shown · non-contrast
Comparison: 05/28/2016

CLINICAL DATA: Cough and congestion

EXAM:
CHEST  2 VIEW

[w chest pa]
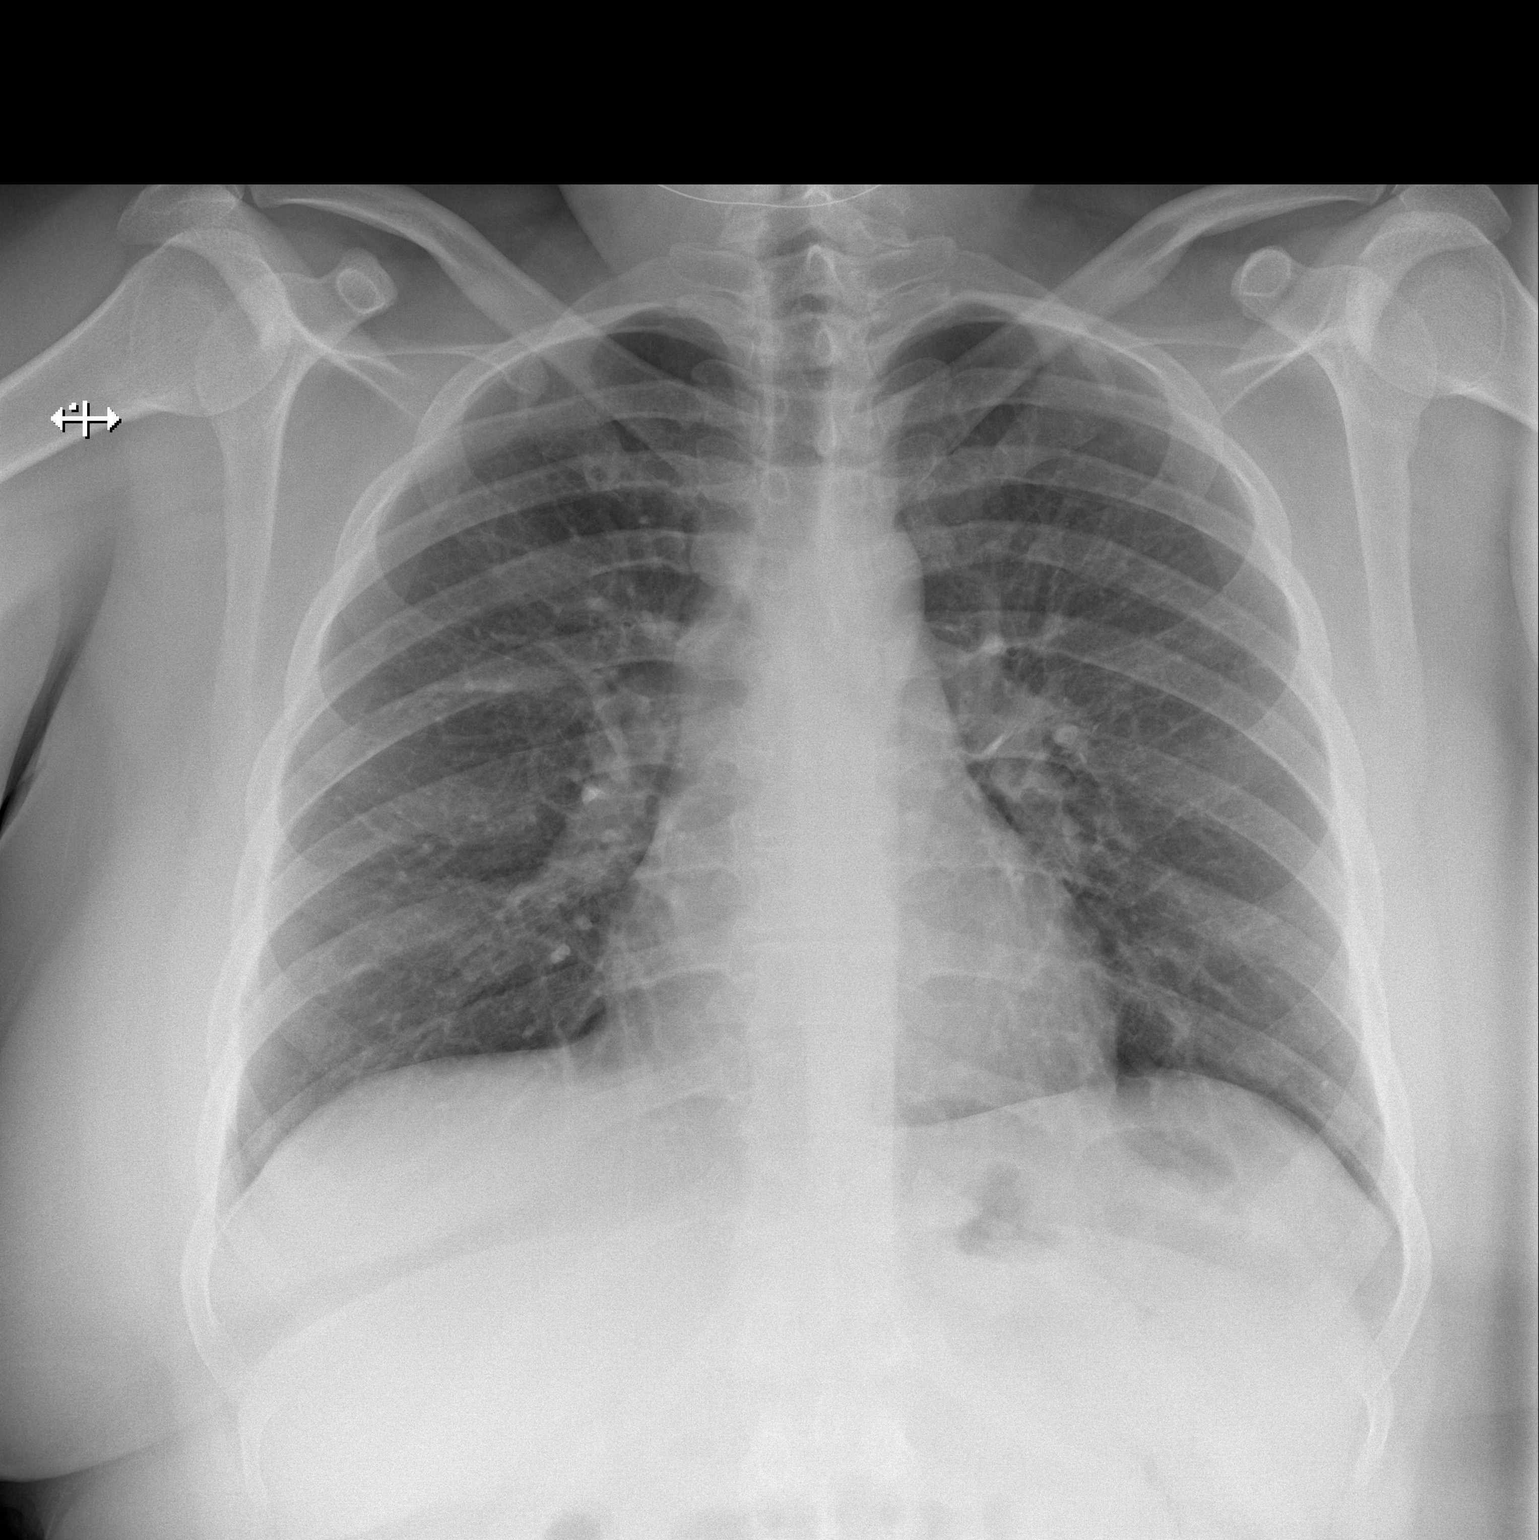

[w chest lat]
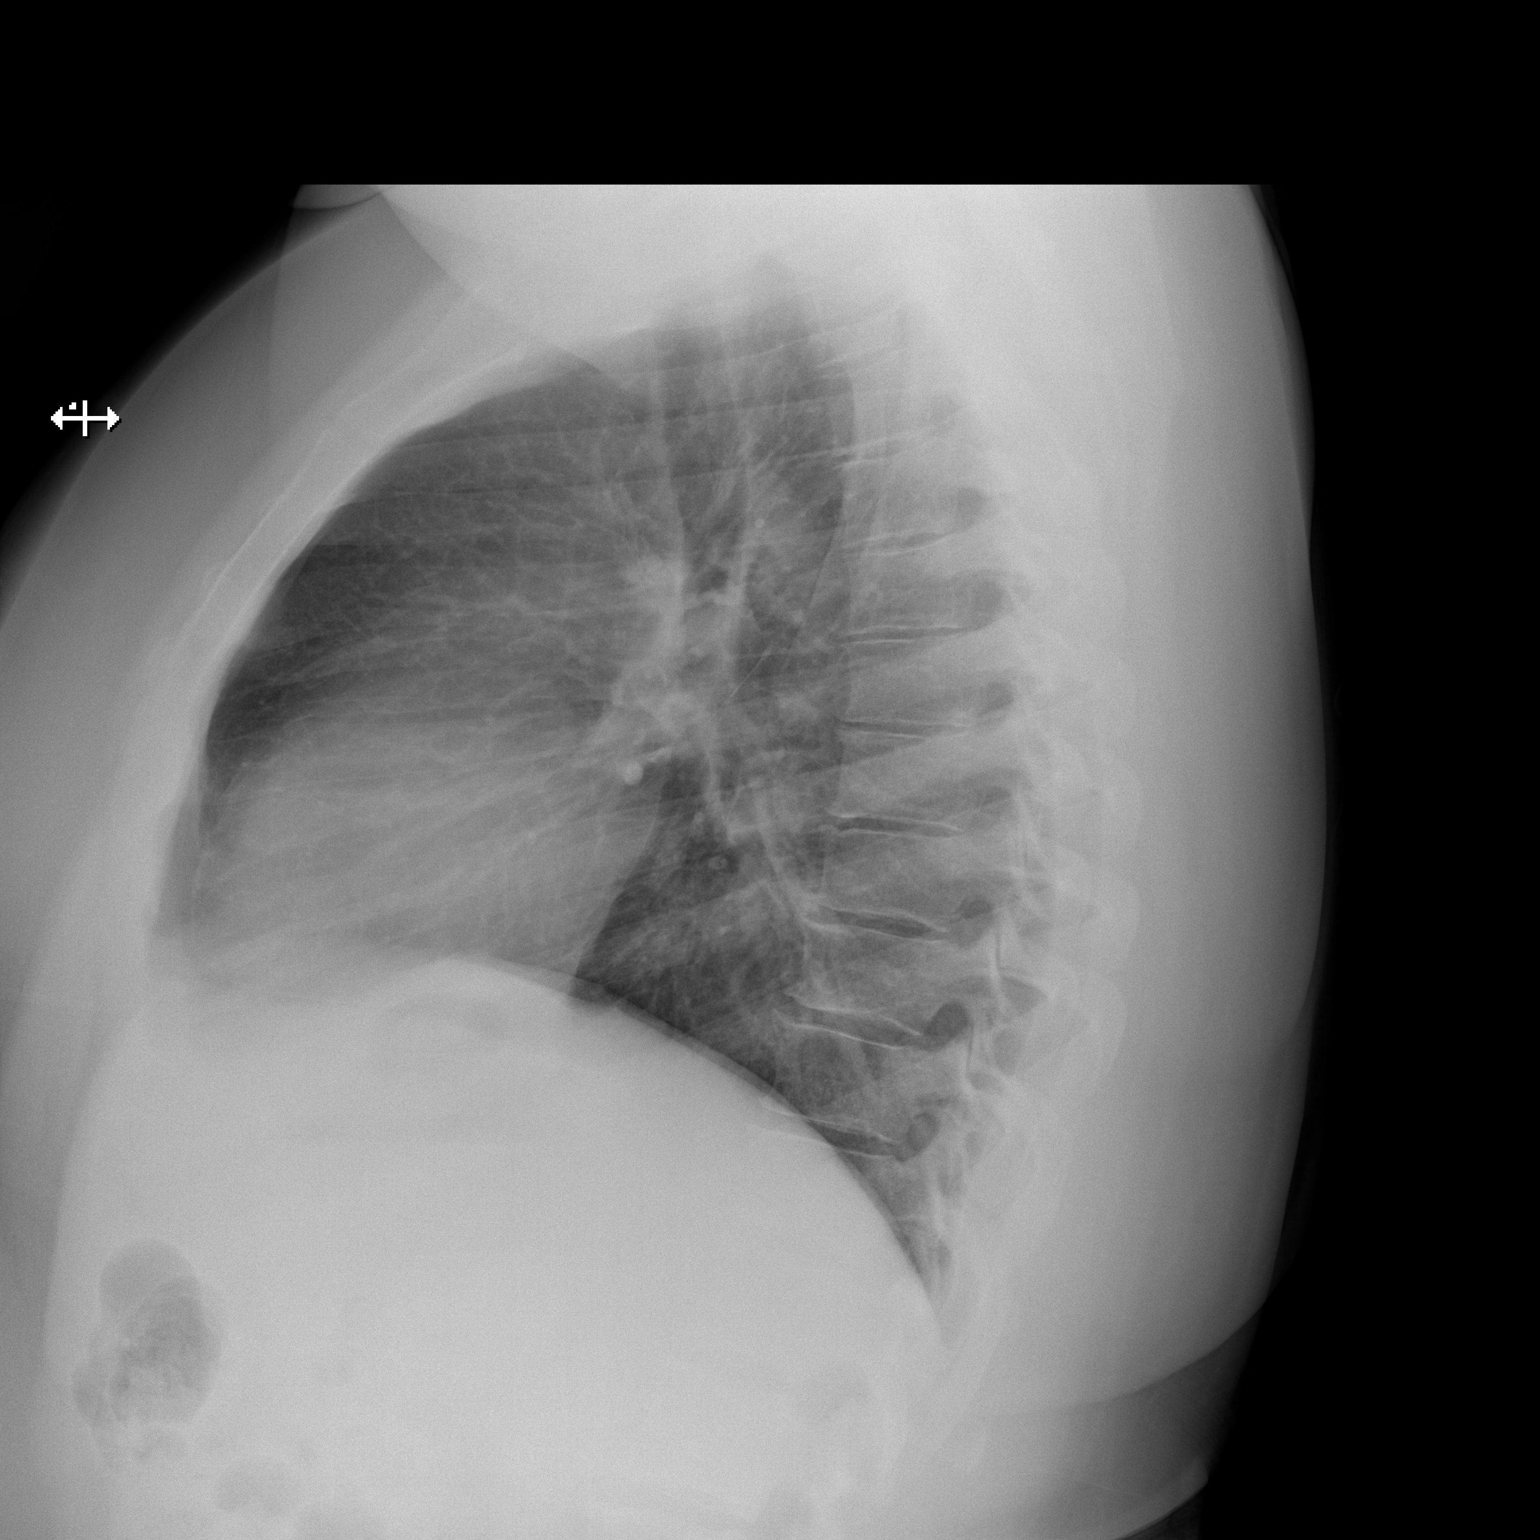

[2 of 2 positions shown; findings below may reference images not displayed]

FINDINGS: The heart size and mediastinal contours are within normal limits.
Both lungs are clear. The visualized skeletal structures are
unremarkable.
IMPRESSION: No active cardiopulmonary disease.

## 2017-07-07 ENCOUNTER — Encounter: Payer: Self-pay | Admitting: Obstetrics & Gynecology

## 2017-07-07 ENCOUNTER — Ambulatory Visit: Payer: No Typology Code available for payment source | Admitting: Obstetrics & Gynecology

## 2017-07-07 VITALS — BP 112/65 | HR 85 | Ht 67.0 in | Wt 335.2 lb

## 2017-07-07 DIAGNOSIS — Z9889 Other specified postprocedural states: Secondary | ICD-10-CM

## 2017-07-07 DIAGNOSIS — N871 Moderate cervical dysplasia: Secondary | ICD-10-CM | POA: Insufficient documentation

## 2017-07-07 NOTE — Progress Notes (Signed)
   Subjective:    Patient ID: Kaitlyn Luna, female    DOB: July 08, 1990, 27 y.o.   MRN: [id number]  HPI 27 yo G0 here for a postop visit. She had a CKC for HGSIL on cervical biopsy and dysplastic cells on her ECC. This colpo was done for a LGSIL pap.   Review of Systems She is using her third Nexplanon, placed in the Fall of 2018.  She repeatedly tells me that she NEVER wants kids.    Objective:   Physical Exam   Breathing, conversing, and ambulating normally Well nourished, well hydrated White female, no apparent distress Cervix- well healed  Pathology showed the following: Diagnosis 1. Endocervix, curettage - BENIGN ENDOCERVICAL MUCOSA. 2. Cervix, cone - LOW GRADE AND HIGH GRADE SQUAMOUS INTRAEPITHELIAL LESION. - SEE COMMENT. Microscopic Comment 2. The sections show a background of low grade squamous intraepithelial lesion (CIN-I) extending to involve the ectocervical margin of resection but not the endocervical margin. There are also foci of high grade squamous intraepithelial lesion involving partially denuded mucosa and endocervical glands (at least CIN II). The high grade squamous intraepithelial lesion does not involve surgical margins of resection. Clinical correlation is recommended. 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 [facility] as [facility] only got the first shot many years ago. If she still wants a BTL when her current Nexplanon is getting ready to expire, I have agreed to do a BTL.

## 2018-01-08 ENCOUNTER — Emergency Department (HOSPITAL_COMMUNITY)
Admission: EM | Admit: 2018-01-08 | Discharge: 2018-01-08 | Disposition: A | Discharge status: Home / Self Care | Payer: Self-pay | Attending: Emergency Medicine | Admitting: Emergency Medicine

## 2018-01-08 DIAGNOSIS — N189 Chronic kidney disease, unspecified: Secondary | ICD-10-CM | POA: Insufficient documentation

## 2018-01-08 DIAGNOSIS — S91312A Laceration without foreign body, left foot, initial encounter: Secondary | ICD-10-CM | POA: Insufficient documentation

## 2018-01-08 DIAGNOSIS — Y9301 Activity, walking, marching and hiking: Secondary | ICD-10-CM | POA: Insufficient documentation

## 2018-01-08 DIAGNOSIS — Z79899 Other long term (current) drug therapy: Secondary | ICD-10-CM | POA: Insufficient documentation

## 2018-01-08 DIAGNOSIS — Z87891 Personal history of nicotine dependence: Secondary | ICD-10-CM | POA: Insufficient documentation

## 2018-01-08 DIAGNOSIS — Y929 Unspecified place or not applicable: Secondary | ICD-10-CM | POA: Insufficient documentation

## 2018-01-08 DIAGNOSIS — W01111A Fall on same level from slipping, tripping and stumbling with subsequent striking against power tool or machine, initial encounter: Secondary | ICD-10-CM | POA: Insufficient documentation

## 2018-01-08 DIAGNOSIS — Y998 Other external cause status: Secondary | ICD-10-CM | POA: Insufficient documentation

## 2018-01-08 DIAGNOSIS — J45909 Unspecified asthma, uncomplicated: Secondary | ICD-10-CM | POA: Insufficient documentation

## 2018-01-08 MED ORDER — LIDOCAINE-EPINEPHRINE (PF) 2 %-1:200000 IJ SOLN
10.0000 mL | Freq: Once | INTRAMUSCULAR | Status: AC
  Administered 2018-01-08: 10 mL via INTRADERMAL
  Filled 2018-01-08: qty 20

## 2018-01-08 MED ORDER — TETANUS-DIPHTH-ACELL PERTUSSIS 5-2.5-18.5 LF-MCG/0.5 IM SUSP
0.5000 mL | Freq: Once | INTRAMUSCULAR | Status: AC
  Administered 2018-01-08: 0.5 mL via INTRAMUSCULAR
  Filled 2018-01-08: qty 0.5

## 2018-01-08 NOTE — ED Triage Notes (Signed)
Pt has lac to her left foot, bleeding controlled at this time.  Pt tripped over a chainsaw.

## 2018-01-08 NOTE — ED Provider Notes (Signed)
[facility] EMERGENCY DEPARTMENT Provider Note   CSN: [id number] Arrival date & time: 01/08/18  1920     History   Chief Complaint Chief Complaint  Patient presents with  . Extremity Laceration    HPI Kaitlyn Luna is a 27 y.o. female.  The history is provided by the patient. No language interpreter was used.    27 year old female presenting for evaluation of left foot laceration.  Patient report approximately 2 hours ago, she was walking barefoot and accidentally cut the top portion of her left foot against a chain saw that was not running.  She reported immediate sharp throbbing pain to the affected area which has since improved.  She applied a bandage and came here.  She does report some tingling sensation to the affected area denies any numbness.  No other injury.  She is not up-to-date with tetanus.  Past Medical History:  Diagnosis Date  . Anemia   . Anxiety   . Asthma   . Bipolar depression (HCC)   . Chronic kidney disease    UTI AND FREQUENT KIDNEY INFECTIONS FREQUENTLY  . Depression   . Pneumonia 2017  . PTSD (post-traumatic stress disorder)   . Sleep apnea    not using c-pap    Patient Active Problem List   Diagnosis Date Noted  . CIN II (cervical intraepithelial neoplasia II) 07/07/2017  . 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 Right ankle pain 04/19/2017  . Morbid obesity (HCC) 02/01/2017  . PTSD (post-traumatic stress disorder) 07/26/2013  . MDD (major depressive disorder), recurrent severe, 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    Past Surgical History:  Procedure Laterality Date  . BREAST SURGERY     REDUCTI0N  . CERVICAL CONIZATION W/BX N/A 05/27/2017   Procedure: CONIZATION CERVIX WITH BIOPSY - COLD KNIFE;  Surgeon: Allie Bossier, MD;  Location: [facility];  Service: Gynecology;  Laterality: N/A;  . KNEE ARTHROSCOPY Left   . TONSILLECTOMY       OB History    Gravida  0   Para  0   Term  0   Preterm  0   AB  0   Living  0     SAB  0   TAB  0   Ectopic  0   Multiple  0   Live Births               Home Medications    Prior to Admission medications   Medication Sig Start Date End Date Taking? Authorizing Provider  cephALEXin (KEFLEX) 500 MG capsule Take 1 capsule (500 mg total) by mouth 4 (four) times daily. 05/14/17   Hudnall, Azucena Fallen, MD  diclofenac sodium (VOLTAREN) 1 % GEL Apply 2 g topically 4 (four) times daily. 05/24/17   Saguier, Ramon Dredge, PA-C  Etonogestrel (IMPLANON [place]) Inject into the skin once.    [provider]  ibuprofen (ADVIL,MOTRIN) 600 MG tablet Take 1 tablet (600 mg total) by mouth every 6 (six) hours as needed. 05/27/17   Allie Bossier, MD  Melatonin 5 MG TABS Take 5 mg by mouth at bedtime.    [provider]  oxyCODONE-acetaminophen (PERCOCET/ROXICET) 5-325 MG tablet Take 1-2 tablets by mouth every 6 (six) hours as needed. 05/27/17   Allie Bossier, MD  phentermine 15 MG capsule Take 1 capsule (15 mg total) by mouth every morning. 05/24/17   Saguier, Ramon Dredge, PA-C  citalopram (CELEXA) 40 MG tablet Take 40 mg by mouth daily.  02/27/15  [provider]    Family History Family History  Problem Relation Age of Onset  . Diabetes Father     Social History Social History   Tobacco Use  . Smoking status: Former Smoker    Types: Cigarettes    Last attempt to quit: 12/07/2015    Years since quitting: 2.0  . Smokeless tobacco: Never Used  Substance Use Topics  . Alcohol use: Yes    Comment: occasional beer  . Drug use: No     Allergies   Patient has no known allergies.   Review of Systems Review of Systems  Constitutional: Negative for fever.  Skin: Positive for wound.  Neurological: Negative for numbness.     Physical Exam Updated Vital Signs BP 118/69 (BP Location: Right Arm)   Pulse 78   Temp 99 F (37.2 C) (Oral)   Resp 20   SpO2 99%   Physical Exam  Constitutional: She appears well-developed and well-nourished. No distress.  HENT:    Head: Atraumatic.  Eyes: Conjunctivae are normal.  Neck: Neck supple.  Musculoskeletal: She exhibits tenderness (Left foot: 5 cm oblique superficial laceration noted to the dorsum of foot without any toe involvement.  No foreign body noted.).  Neurological: She is alert.  Skin: No rash noted.  Psychiatric: She has a normal mood and affect.  Nursing note and vitals reviewed.    ED Treatments / Results  Labs (all labs ordered are listed, but only abnormal results are displayed) Labs Reviewed - No data to display  EKG None  Radiology No results found.  Procedures .Marland KitchenLaceration Repair Date/Time: 01/08/2018 9:26 PM Performed by: [name], PA-C Authorized by: [name], PA-C   Consent:    Consent obtained:  Verbal   Consent given by:  Patient   Risks discussed:  Infection, need for additional repair, pain, poor cosmetic result and poor wound healing   Alternatives discussed:  No treatment and delayed treatment Universal protocol:    Procedure explained and questions answered to patient or proxy's satisfaction: yes     Relevant documents present and verified: yes     Test results available and properly labeled: yes     Imaging studies available: yes     Required blood products, implants, devices, and special equipment available: yes     Site/side marked: yes     Immediately prior to procedure, a time out was called: yes     Patient identity confirmed:  Verbally with patient Anesthesia (see MAR for exact dosages):    Anesthesia method:  Local infiltration   Local anesthetic:  Lidocaine 2% WITH epi Laceration details:    Location:  Foot   Foot location:  Top of L foot   Length (cm):  5   Depth (mm):  2 Repair type:    Repair type:  Intermediate Pre-procedure details:    Preparation:  Patient was prepped and draped in usual sterile fashion Exploration:    Hemostasis achieved with:  Direct pressure   Wound exploration: wound explored through full range of motion and  entire depth of wound probed and visualized     Wound extent: no muscle damage noted, no nerve damage noted and no tendon damage noted     Contaminated: no   Treatment:    Area cleansed with:  Saline   Amount of cleaning:  Standard   Irrigation solution:  Sterile saline   Irrigation volume:  400   Irrigation method:  Pressure wash   Visualized foreign bodies/material removed: no  Skin repair:    Repair method:  Sutures   Suture size:  5-0   Suture material:  Prolene   Suture technique:  Simple interrupted   Number of sutures:  9 Approximation:    Approximation:  Close Post-procedure details:    Dressing:  Non-adherent dressing   Patient tolerance of procedure:  Tolerated well, no immediate complications   (including critical care time)  Medications Ordered in ED Medications - No data to display   Initial Impression / Assessment and Plan / ED Course  I have reviewed the triage vital signs and the nursing notes.  Pertinent labs & imaging results that were available during my care of the patient were reviewed by me and considered in my medical decision making (see chart for details).     BP 118/69 (BP Location: Right Arm)   Pulse 78   Temp 99 F (37.2 C) (Oral)   Resp 20   SpO2 99%    Final Clinical Impressions(s) / ED Diagnoses   Final diagnoses:  Foot laceration, left, initial encounter    ED Discharge Orders    None     9:35 PM Lac to L foot sutured by me.  Recommend suture removal in 7 days.     Fayrene Helperran, Odella Appelhans, PA-C 01/08/18 2135    Virgina Norfolkuratolo, Adam, DO 01/09/18 1110

## 2018-01-08 NOTE — Discharge Instructions (Addendum)
Cleanse wound and monitor daily for any sign of infection.  Take over the counter tylenol or ibuprofen as needed for pain.  Have your sutures remove in 7 days.

## 2018-01-08 NOTE — ED Notes (Signed)
Patient able to ambulate independently  

## 2018-01-08 NOTE — ED Notes (Signed)
APP at bedside performing suture repair at this time

## 2018-02-27 ENCOUNTER — Emergency Department (HOSPITAL_COMMUNITY): Payer: No Typology Code available for payment source

## 2018-02-27 ENCOUNTER — Encounter (HOSPITAL_COMMUNITY): Payer: Self-pay | Admitting: Emergency Medicine

## 2018-02-27 ENCOUNTER — Emergency Department (HOSPITAL_COMMUNITY)
Admission: EM | Admit: 2018-02-27 | Discharge: 2018-02-27 | Disposition: A | Discharge status: Home / Self Care | Payer: No Typology Code available for payment source | Attending: Emergency Medicine | Admitting: Emergency Medicine

## 2018-02-27 ENCOUNTER — Other Ambulatory Visit: Payer: Self-pay

## 2018-02-27 DIAGNOSIS — Y998 Other external cause status: Secondary | ICD-10-CM | POA: Diagnosis not present

## 2018-02-27 DIAGNOSIS — Y92410 Unspecified street and highway as the place of occurrence of the external cause: Secondary | ICD-10-CM | POA: Diagnosis not present

## 2018-02-27 DIAGNOSIS — J45909 Unspecified asthma, uncomplicated: Secondary | ICD-10-CM | POA: Insufficient documentation

## 2018-02-27 DIAGNOSIS — M25512 Pain in left shoulder: Secondary | ICD-10-CM

## 2018-02-27 DIAGNOSIS — Z87891 Personal history of nicotine dependence: Secondary | ICD-10-CM | POA: Diagnosis not present

## 2018-02-27 DIAGNOSIS — Z79899 Other long term (current) drug therapy: Secondary | ICD-10-CM | POA: Diagnosis not present

## 2018-02-27 DIAGNOSIS — Y939 Activity, unspecified: Secondary | ICD-10-CM | POA: Insufficient documentation

## 2018-02-27 MED ORDER — METHOCARBAMOL 500 MG PO TABS: 1000.0000 mg | ORAL_TABLET | Freq: Four times a day (QID) | ORAL | 0 refills | Status: DC

## 2018-02-27 MED ORDER — NAPROXEN 500 MG PO TABS: 500.0000 mg | ORAL_TABLET | Freq: Two times a day (BID) | ORAL | 0 refills | Status: DC

## 2018-02-27 NOTE — ED Notes (Signed)
Placed left shoulder immobilizer on pt, informed of procedure, tolerated well.
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
## 2018-06-24 ENCOUNTER — Ambulatory Visit: Payer: Self-pay | Admitting: Nurse Practitioner

## 2018-09-20 ENCOUNTER — Other Ambulatory Visit: Payer: Self-pay

## 2018-09-20 ENCOUNTER — Emergency Department (HOSPITAL_COMMUNITY)
Admission: EM | Admit: 2018-09-20 | Discharge: 2018-09-20 | Disposition: A | Discharge status: Home / Self Care | Payer: Self-pay | Attending: Emergency Medicine | Admitting: Emergency Medicine

## 2018-09-20 ENCOUNTER — Encounter (HOSPITAL_COMMUNITY): Payer: Self-pay

## 2018-09-20 DIAGNOSIS — L2089 Other atopic dermatitis: Secondary | ICD-10-CM | POA: Insufficient documentation

## 2018-09-20 DIAGNOSIS — Z87891 Personal history of nicotine dependence: Secondary | ICD-10-CM | POA: Insufficient documentation

## 2018-09-20 DIAGNOSIS — J45909 Unspecified asthma, uncomplicated: Secondary | ICD-10-CM | POA: Insufficient documentation

## 2018-09-20 DIAGNOSIS — Z79899 Other long term (current) drug therapy: Secondary | ICD-10-CM | POA: Insufficient documentation

## 2018-09-20 MED ORDER — TRIAMCINOLONE ACETONIDE 0.1 % EX CREA: 1.0000 "application " | TOPICAL_CREAM | Freq: Two times a day (BID) | CUTANEOUS | 0 refills | Status: DC

## 2018-09-20 MED ORDER — AQUAPHOR EX OINT: TOPICAL_OINTMENT | CUTANEOUS | 0 refills | Status: DC | PRN

## 2018-09-20 NOTE — ED Triage Notes (Signed)
Pt states she has had a rash that started as a bump and now has spread all over the hands and has has open spots and has some crackling in the skin. Pt states that she has use otc stuff but still has not helped to alleviate that pain.

## 2018-09-20 NOTE — ED Provider Notes (Signed)
[facility] EMERGENCY DEPARTMENT Provider Note   CSN: [id number] Arrival date & time: 09/20/18  4098    History   Chief Complaint Chief Complaint  Patient presents with  . Rash    HPI Kaitlyn Luna is a 28 y.o. female.     HPI  This is a 28 year old female with a history of bipolar disorder, chronic kidney disease who presents with rash to the bilateral upper extremities.  Patient with a history of eczema.  She states over the last 3 weeks she has noted waxing and waning worsening rash over the backside of her bilateral hands.  She states that at work they are using new chemicals to clean and she also is washing dishes.  She has used hydrocortisone cream with only intermittent relief.  She states that it is painful and itchy.  She denies any fevers or systemic symptoms.  She denies rash elsewhere.  She denies any other exposures.  Past Medical History:  Diagnosis Date  . Anemia   . Anxiety   . Asthma   . Bipolar depression (HCC)   . Chronic kidney disease    UTI AND FREQUENT KIDNEY INFECTIONS FREQUENTLY  . Depression   . Pneumonia 2017  . PTSD (post-traumatic stress disorder)   . Sleep apnea    not using c-pap    Patient Active Problem List   Diagnosis Date Noted  . CIN II (cervical intraepithelial neoplasia II) 07/07/2017  . 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 Right ankle pain 04/19/2017  . Morbid obesity (HCC) 02/01/2017  . PTSD (post-traumatic stress disorder) 07/26/2013  . MDD (major depressive disorder), recurrent severe, 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    Past Surgical History:  Procedure Laterality Date  . BREAST SURGERY     REDUCTI0N  . CERVICAL CONIZATION W/BX N/A 05/27/2017   Procedure: CONIZATION CERVIX WITH BIOPSY - COLD KNIFE;  Surgeon: Allie Bossier, MD;  Location: [facility];  Service: Gynecology;  Laterality: N/A;  . KNEE ARTHROSCOPY Left   . TONSILLECTOMY       OB History    Gravida  0   Para  0   Term  0   Preterm  0   AB  0   Living  0     SAB  0   TAB  0   Ectopic  0   Multiple  0   Live Births               Home Medications    Prior to Admission medications   Medication Sig Start Date End Date Taking? Authorizing Provider  cephALEXin (KEFLEX) 500 MG capsule Take 1 capsule (500 mg total) by mouth 4 (four) times daily. 05/14/17   Hudnall, Azucena Fallen, MD  diclofenac sodium (VOLTAREN) 1 % GEL Apply 2 g topically 4 (four) times daily. 05/24/17   Saguier, Ramon Dredge, PA-C  Etonogestrel (IMPLANON [place]) Inject into the skin once.    [provider]  ibuprofen (ADVIL,MOTRIN) 600 MG tablet Take 1 tablet (600 mg total) by mouth every 6 (six) hours as needed. 05/27/17   Allie Bossier, MD  Melatonin 5 MG TABS Take 5 mg by mouth at bedtime.    [provider]  methocarbamol (ROBAXIN) 500 MG tablet Take 2 tablets (1,000 mg total) by mouth 4 (four) times daily. 02/27/18   Renne Crigler, PA-C  mineral oil-hydrophilic petrolatum (AQUAPHOR) ointment Apply topically as needed for dry skin. 09/20/18   Glorianne Proctor, Mayer Masker, MD  naproxen (NAPROSYN) 500 MG tablet  Take 1 tablet (500 mg total) by mouth 2 (two) times daily. 02/27/18   Renne Crigler, PA-C  oxyCODONE-acetaminophen (PERCOCET/ROXICET) 5-325 MG tablet Take 1-2 tablets by mouth every 6 (six) hours as needed. 05/27/17   Allie Bossier, MD  phentermine 15 MG capsule Take 1 capsule (15 mg total) by mouth every morning. 05/24/17   Saguier, Ramon Dredge, PA-C  triamcinolone cream (KENALOG) 0.1 % Apply 1 application topically 2 (two) times daily. 09/20/18   Arnaldo Heffron, Mayer Masker, MD    Family History Family History  Problem Relation Age of Onset  . Diabetes Father     Social History Social History   Tobacco Use  . Smoking status: Former Smoker    Types: Cigarettes    Last attempt to quit: 12/07/2015    Years since quitting: 2.7  . Smokeless tobacco: Never Used  Substance Use Topics  . Alcohol use: Yes    Comment: occasional beer  . Drug use: No      Allergies   Patient has no known allergies.   Review of Systems Review of Systems  Constitutional: Negative for fever.  Skin: Positive for rash.  All other systems reviewed and are negative.    Physical Exam Updated Vital Signs BP 119/85 (BP Location: Right Arm)   Pulse 81   Temp 99.2 F (37.3 C) (Oral)   Resp 18   Ht 1.676 m (5\' 6" )   Wt 136.1 kg   SpO2 100%   BMI 48.42 kg/m   Physical Exam Vitals signs and nursing note reviewed.  Constitutional:      Appearance: She is well-developed. She is obese.  HENT:     Head: Normocephalic and atraumatic.  Cardiovascular:     Rate and Rhythm: Normal rate and regular rhythm.  Pulmonary:     Effort: Pulmonary effort is normal. No respiratory distress.  Musculoskeletal:        General: No swelling.  Skin:    General: Skin is warm and dry.     Comments: Patchy rash noted over the dorsum of the bilateral hands with slight erythema, some overlying excoriation, spares the palms  Neurological:     Mental Status: She is alert and oriented to person, place, and time.  Psychiatric:        Mood and Affect: Mood normal.      ED Treatments / Results  Labs (all labs ordered are listed, but only abnormal results are displayed) Labs Reviewed - No data to display  EKG None  Radiology No results found.  Procedures Procedures (including critical care time)  Medications Ordered in ED Medications - No data to display   Initial Impression / Assessment and Plan / ED Course  I have reviewed the triage vital signs and the nursing notes.  Pertinent labs & imaging results that were available during my care of the patient were reviewed by me and considered in my medical decision making (see chart for details).        Patient presents with rash to the bilateral hands.  Rash is most consistent with atopic dermatitis flare.  Patient needs to avoid contact with cleaning products and submersion in water.  I will provide her with  triamcinolone cream.  Also recommend Aquaphor for moisturization.  After history, exam, and medical workup I feel the patient has been appropriately medically screened and is safe for discharge home. Pertinent diagnoses were discussed with the patient. Patient was given return precautions.   Final Clinical Impressions(s) / ED Diagnoses  Final diagnoses:  Other atopic dermatitis    ED Discharge Orders         Ordered    mineral oil-hydrophilic petrolatum (AQUAPHOR) ointment  As needed     09/20/18 0657    triamcinolone cream (KENALOG) 0.1 %  2 times daily     09/20/18 0657           Terrianne Cavness, Mayer Maskerourtney F, MD 09/20/18 [phone]

## 2018-09-25 ENCOUNTER — Encounter (HOSPITAL_BASED_OUTPATIENT_CLINIC_OR_DEPARTMENT_OTHER): Payer: Self-pay | Admitting: Emergency Medicine

## 2018-09-25 ENCOUNTER — Emergency Department (HOSPITAL_BASED_OUTPATIENT_CLINIC_OR_DEPARTMENT_OTHER)
Admission: EM | Admit: 2018-09-25 | Discharge: 2018-09-25 | Disposition: A | Discharge status: Home / Self Care | Payer: Self-pay | Attending: Emergency Medicine | Admitting: Emergency Medicine

## 2018-09-25 ENCOUNTER — Other Ambulatory Visit: Payer: Self-pay

## 2018-09-25 DIAGNOSIS — N760 Acute vaginitis: Secondary | ICD-10-CM | POA: Insufficient documentation

## 2018-09-25 DIAGNOSIS — B9689 Other specified bacterial agents as the cause of diseases classified elsewhere: Secondary | ICD-10-CM | POA: Insufficient documentation

## 2018-09-25 DIAGNOSIS — Z8541 Personal history of malignant neoplasm of cervix uteri: Secondary | ICD-10-CM | POA: Insufficient documentation

## 2018-09-25 DIAGNOSIS — Z87891 Personal history of nicotine dependence: Secondary | ICD-10-CM | POA: Insufficient documentation

## 2018-09-25 DIAGNOSIS — Z79899 Other long term (current) drug therapy: Secondary | ICD-10-CM | POA: Insufficient documentation

## 2018-09-25 DIAGNOSIS — J45909 Unspecified asthma, uncomplicated: Secondary | ICD-10-CM | POA: Insufficient documentation

## 2018-09-25 LAB — WET PREP, GENITAL
Sperm: NONE SEEN
Trich, Wet Prep: NONE SEEN
WBC, Wet Prep HPF POC: NONE SEEN
Yeast Wet Prep HPF POC: NONE SEEN

## 2018-09-25 LAB — URINALYSIS, ROUTINE W REFLEX MICROSCOPIC
Bilirubin Urine: NEGATIVE
Glucose, UA: NEGATIVE mg/dL
Hgb urine dipstick: NEGATIVE
Ketones, ur: NEGATIVE mg/dL
Leukocytes,Ua: NEGATIVE
Nitrite: NEGATIVE
Protein, ur: NEGATIVE mg/dL
Specific Gravity, Urine: 1.025 (ref 1.005–1.030)
pH: 6.5 (ref 5.0–8.0)

## 2018-09-25 LAB — PREGNANCY, URINE: Preg Test, Ur: NEGATIVE

## 2018-09-25 MED ORDER — AZITHROMYCIN 250 MG PO TABS
1000.0000 mg | ORAL_TABLET | Freq: Once | ORAL | Status: AC
  Administered 2018-09-25: 1000 mg via ORAL
  Filled 2018-09-25: qty 4

## 2018-09-25 MED ORDER — METRONIDAZOLE 500 MG PO TABS: 500.0000 mg | ORAL_TABLET | Freq: Two times a day (BID) | ORAL | 0 refills | Status: AC

## 2018-09-25 MED ORDER — CEFTRIAXONE SODIUM 250 MG IJ SOLR
250.0000 mg | Freq: Once | INTRAMUSCULAR | Status: AC
  Administered 2018-09-25: 250 mg via INTRAMUSCULAR
  Filled 2018-09-25: qty 250

## 2018-09-25 NOTE — ED Triage Notes (Signed)
Low back pain for 2 days with dysuria. Denies vaginal discharge. Her sexual partner was recently diagnosed with an STD

## 2018-09-25 NOTE — ED Provider Notes (Signed)
[facility] EMERGENCY DEPARTMENT Provider Note   CSN: [id number] Arrival date & time: 09/25/18  1216    History   Chief Complaint Chief Complaint  Patient presents with  . Dysuria  . Back Pain    HPI Kaitlyn Luna is a 28 y.o. female.     The history is provided by the patient.  Dysuria  Pain quality:  Aching and burning Pain severity:  Mild Onset quality:  Gradual Duration:  2 days Timing:  Intermittent Progression:  Waxing and waning Chronicity:  New Relieved by:  Nothing Worsened by:  Nothing Ineffective treatments:  None tried Urinary symptoms: frequent urination   Urinary symptoms comment:  Back pain  Associated symptoms: no abdominal pain, no fever, no flank pain, no genital lesions, no nausea, no vaginal discharge and no vomiting   Risk factors: recurrent urinary tract infections and sexually transmitted infections (possible exposure)     Past Medical History:  Diagnosis Date  . Anemia   . Anxiety   . Asthma   . Bipolar depression (HCC)   . Chronic kidney disease    UTI AND FREQUENT KIDNEY INFECTIONS FREQUENTLY  . Depression   . Pneumonia 2017  . PTSD (post-traumatic stress disorder)   . Sleep apnea    not using c-pap    Patient Active Problem List   Diagnosis Date Noted  . CIN II (cervical intraepithelial neoplasia II) 07/07/2017  . 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 Right ankle pain 04/19/2017  . Morbid obesity (HCC) 02/01/2017  . PTSD (post-traumatic stress disorder) 07/26/2013  . MDD (major depressive disorder), recurrent severe, 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    Past Surgical History:  Procedure Laterality Date  . BREAST SURGERY     REDUCTI0N  . CERVICAL CONIZATION W/BX N/A 05/27/2017   Procedure: CONIZATION CERVIX WITH BIOPSY - COLD KNIFE;  Surgeon: Allie Bossier, MD;  Location: [facility];  Service: Gynecology;  Laterality: N/A;  . KNEE ARTHROSCOPY Left   . TONSILLECTOMY       OB History    Gravida  0   Para  0   Term   0   Preterm  0   AB  0   Living  0     SAB  0   TAB  0   Ectopic  0   Multiple  0   Live Births               Home Medications    Prior to Admission medications   Medication Sig Start Date End Date Taking? Authorizing Provider  cephALEXin (KEFLEX) 500 MG capsule Take 1 capsule (500 mg total) by mouth 4 (four) times daily. 05/14/17   Hudnall, Azucena Fallen, MD  diclofenac sodium (VOLTAREN) 1 % GEL Apply 2 g topically 4 (four) times daily. 05/24/17   Saguier, Ramon Dredge, PA-C  Etonogestrel (IMPLANON [place]) Inject into the skin once.    [provider]  ibuprofen (ADVIL,MOTRIN) 600 MG tablet Take 1 tablet (600 mg total) by mouth every 6 (six) hours as needed. 05/27/17   Allie Bossier, MD  Melatonin 5 MG TABS Take 5 mg by mouth at bedtime.    [provider]  methocarbamol (ROBAXIN) 500 MG tablet Take 2 tablets (1,000 mg total) by mouth 4 (four) times daily. 02/27/18   Renne Crigler, PA-C  metroNIDAZOLE (FLAGYL) 500 MG tablet Take 1 tablet (500 mg total) by mouth 2 (two) times daily for 7 days. 09/25/18 10/02/18  Valyncia Wiens, DO  mineral oil-hydrophilic petrolatum (  AQUAPHOR) ointment Apply topically as needed for dry skin. 09/20/18   Horton, Mayer Maskerourtney F, MD  naproxen (NAPROSYN) 500 MG tablet Take 1 tablet (500 mg total) by mouth 2 (two) times daily. 02/27/18   Renne CriglerGeiple, Joshua, PA-C  oxyCODONE-acetaminophen (PERCOCET/ROXICET) 5-325 MG tablet Take 1-2 tablets by mouth every 6 (six) hours as needed. 05/27/17   Allie Bossierove, Myra C, MD  phentermine 15 MG capsule Take 1 capsule (15 mg total) by mouth every morning. 05/24/17   Saguier, Ramon DredgeEdward, PA-C  triamcinolone cream (KENALOG) 0.1 % Apply 1 application topically 2 (two) times daily. 09/20/18   Horton, Mayer Maskerourtney F, MD    Family History Family History  Problem Relation Age of Onset  . Diabetes Father     Social History Social History   Tobacco Use  . Smoking status: Former Smoker    Types: Cigarettes    Last attempt to quit: 12/07/2015     Years since quitting: 2.8  . Smokeless tobacco: Never Used  Substance Use Topics  . Alcohol use: Yes    Comment: occasional beer  . Drug use: No     Allergies   Patient has no known allergies.   Review of Systems Review of Systems  Constitutional: Negative for chills and fever.  HENT: Negative for ear pain and sore throat.   Eyes: Negative for pain and visual disturbance.  Respiratory: Negative for cough and shortness of breath.   Cardiovascular: Negative for chest pain and palpitations.  Gastrointestinal: Negative for abdominal pain, nausea and vomiting.  Genitourinary: Positive for dysuria and frequency. Negative for decreased urine volume, difficulty urinating, enuresis, flank pain, genital sores, hematuria, menstrual problem, pelvic pain, urgency, vaginal bleeding and vaginal discharge.  Musculoskeletal: Positive for back pain. Negative for arthralgias.  Skin: Negative for color change and rash.  Neurological: Negative for seizures and syncope.  All other systems reviewed and are negative.    Physical Exam Updated Vital Signs  ED Triage Vitals  Enc Vitals Group     BP 09/25/18 1223 110/75     Pulse Rate 09/25/18 1223 67     Resp 09/25/18 1223 16     Temp 09/25/18 1224 99.6 F (37.6 C)     Temp Source 09/25/18 1224 Oral     SpO2 09/25/18 1223 99 %     Weight 09/25/18 1222 299 lb 13.2 oz (136 kg)     Height 09/25/18 1222 5\' 6"  (1.676 m)     Head Circumference --      Peak Flow --      Pain Score 09/25/18 1223 3     Pain Loc --      Pain Edu? --      Excl. in [place]? --     Physical Exam Vitals signs and nursing note reviewed.  Constitutional:      General: She is not in acute distress.    Appearance: She is well-developed.  HENT:     Head: Normocephalic and atraumatic.     Nose: Nose normal.     Mouth/Throat:     Mouth: Mucous membranes are moist.  Eyes:     Extraocular Movements: Extraocular movements intact.     Conjunctiva/sclera: Conjunctivae  normal.     Pupils: Pupils are equal, round, and reactive to light.  Neck:     Musculoskeletal: Normal range of motion and neck supple.  Cardiovascular:     Rate and Rhythm: Normal rate and regular rhythm.     Pulses: Normal pulses.  Heart sounds: Normal heart sounds. No murmur.  Pulmonary:     Effort: Pulmonary effort is normal. No respiratory distress.     Breath sounds: Normal breath sounds.  Abdominal:     General: There is no distension.     Palpations: Abdomen is soft.     Tenderness: There is no abdominal tenderness.  Genitourinary:    Comments: Pt did not want exam, wanted to do self swabs Musculoskeletal:        General: No tenderness.  Skin:    General: Skin is warm and dry.     Capillary Refill: Capillary refill takes less than 2 seconds.  Neurological:     General: No focal deficit present.     Mental Status: She is alert.      ED Treatments / Results  Labs (all labs ordered are listed, but only abnormal results are displayed) Labs Reviewed  WET PREP, GENITAL - Abnormal; Notable for the following components:      Result Value   Clue Cells Wet Prep HPF POC PRESENT (*)    All other components within normal limits  URINALYSIS, ROUTINE W REFLEX MICROSCOPIC  PREGNANCY, URINE  GC/CHLAMYDIA PROBE AMP ([place]) NOT AT [facility]    EKG None  Radiology No results found.  Procedures Procedures (including critical care time)  Medications Ordered in ED Medications  azithromycin (ZITHROMAX) tablet 1,000 mg (1,000 mg Oral Given 09/25/18 1312)  cefTRIAXone (ROCEPHIN) injection 250 mg (250 mg Intramuscular Given 09/25/18 1313)     Initial Impression / Assessment and Plan / ED Course  I have reviewed the triage vital signs and the nursing notes.  Pertinent labs & imaging results that were available during my care of the patient were reviewed by me and considered in my medical decision making (see chart for details).     Genetta Fiero is a 28 year old  female here with urinary symptoms.  Concern for STD.  Normal vitals.  No fever.  History of recurrent urinary tract infections.  Feels similar.  Has some low back pain that is usually associated with her urinary tract infections.  Denies any vaginal discharge, vaginal discomfort.  No abdominal pain.  Possible STD exposure.  Will treat with Rocephin and azithromycin.  Patient does not want pelvic exam and will self swab for gonorrhea, chlamydia, wet prep.  Will check urinalysis.  No history of kidney stones and no concern for kidney stones based off history and physical.  Will reevaluate after urinalysis.  Urinalysis negative for infection.  Wet prep positive for bacterial vaginosis.  Will treat with Flagyl.  Discharged from ED in good condition.  Educated about STDs.  We will follow-up GC chlamydia.  This chart was dictated using voice recognition software.  Despite best efforts to proofread,  errors can occur which can change the documentation meaning.    Final Clinical Impressions(s) / ED Diagnoses   Final diagnoses:  Bacterial vaginosis    ED Discharge Orders         Ordered    metroNIDAZOLE (FLAGYL) 500 MG tablet  2 times daily     09/25/18 1340           [place], DO 09/25/18 1341

## 2018-09-26 LAB — GC/CHLAMYDIA PROBE AMP (~~LOC~~) NOT AT ARMC
Chlamydia: NEGATIVE
Neisseria Gonorrhea: NEGATIVE

## 2019-01-05 ENCOUNTER — Other Ambulatory Visit: Payer: Self-pay

## 2019-01-05 ENCOUNTER — Ambulatory Visit (INDEPENDENT_AMBULATORY_CARE_PROVIDER_SITE_OTHER): Payer: Medicaid Other

## 2019-01-05 ENCOUNTER — Encounter (HOSPITAL_COMMUNITY): Payer: Self-pay | Admitting: Emergency Medicine

## 2019-01-05 ENCOUNTER — Ambulatory Visit (HOSPITAL_COMMUNITY)
Admission: EM | Admit: 2019-01-05 | Discharge: 2019-01-05 | Disposition: A | Discharge status: Home / Self Care | Payer: Medicaid Other | Attending: Internal Medicine | Admitting: Internal Medicine

## 2019-01-05 DIAGNOSIS — R05 Cough: Secondary | ICD-10-CM

## 2019-01-05 DIAGNOSIS — Z87891 Personal history of nicotine dependence: Secondary | ICD-10-CM | POA: Insufficient documentation

## 2019-01-05 DIAGNOSIS — N189 Chronic kidney disease, unspecified: Secondary | ICD-10-CM | POA: Insufficient documentation

## 2019-01-05 DIAGNOSIS — R6889 Other general symptoms and signs: Secondary | ICD-10-CM

## 2019-01-05 DIAGNOSIS — J111 Influenza due to unidentified influenza virus with other respiratory manifestations: Secondary | ICD-10-CM | POA: Diagnosis not present

## 2019-01-05 DIAGNOSIS — Z20828 Contact with and (suspected) exposure to other viral communicable diseases: Secondary | ICD-10-CM | POA: Insufficient documentation

## 2019-01-05 DIAGNOSIS — F319 Bipolar disorder, unspecified: Secondary | ICD-10-CM | POA: Insufficient documentation

## 2019-01-05 DIAGNOSIS — R51 Headache: Secondary | ICD-10-CM

## 2019-01-05 DIAGNOSIS — J45909 Unspecified asthma, uncomplicated: Secondary | ICD-10-CM | POA: Diagnosis not present

## 2019-01-05 DIAGNOSIS — M791 Myalgia, unspecified site: Secondary | ICD-10-CM

## 2019-01-05 DIAGNOSIS — R197 Diarrhea, unspecified: Secondary | ICD-10-CM | POA: Diagnosis not present

## 2019-01-05 DIAGNOSIS — R0602 Shortness of breath: Secondary | ICD-10-CM

## 2019-01-05 DIAGNOSIS — R062 Wheezing: Secondary | ICD-10-CM

## 2019-01-05 DIAGNOSIS — Z833 Family history of diabetes mellitus: Secondary | ICD-10-CM | POA: Insufficient documentation

## 2019-01-05 DIAGNOSIS — Z791 Long term (current) use of non-steroidal anti-inflammatories (NSAID): Secondary | ICD-10-CM | POA: Diagnosis not present

## 2019-01-05 DIAGNOSIS — Z79899 Other long term (current) drug therapy: Secondary | ICD-10-CM | POA: Insufficient documentation

## 2019-01-05 DIAGNOSIS — J3489 Other specified disorders of nose and nasal sinuses: Secondary | ICD-10-CM

## 2019-01-05 DIAGNOSIS — G4730 Sleep apnea, unspecified: Secondary | ICD-10-CM | POA: Insufficient documentation

## 2019-01-05 MED ORDER — ALBUTEROL SULFATE HFA 108 (90 BASE) MCG/ACT IN AERS: 1.0000 | INHALATION_SPRAY | Freq: Four times a day (QID) | RESPIRATORY_TRACT | 0 refills | Status: DC | PRN

## 2019-01-05 NOTE — ED Triage Notes (Signed)
Pt here for URI sx and fatigue; pt sts some diarrhea

## 2019-01-05 NOTE — ED Provider Notes (Addendum)
[facility]    CSN: [id number] Arrival date & time: 01/05/19  1132      History   Chief Complaint Chief Complaint  Patient presents with  . URI  . Diarrhea    HPI Kaitlyn Luna is a 28 y.o. female with history of asthma on medications, bipolar disorder currently controlled comes to urgent care with a 2 to 3-day history of runny nose, nonproductive cough and exertional shortness of breath with wheezing.  Patient says symptoms started a few days ago and is getting progressively worse.  She has not tried any over-the-counter medications.  She had generalized body aches, headache and recently diarrhea without nausea or vomiting.  No abdominal pain or distention.  No sick contacts.  Patient's appetite is fair.  She has dizziness without loss of consciousness.   HPI  Past Medical History:  Diagnosis Date  . Anemia   . Anxiety   . Asthma   . Bipolar depression (HCC)   . Chronic kidney disease    UTI AND FREQUENT KIDNEY INFECTIONS FREQUENTLY  . Depression   . Pneumonia 2017  . PTSD (post-traumatic stress disorder)   . Sleep apnea    not using c-pap    Patient Active Problem List   Diagnosis Date Noted  . CIN II (cervical intraepithelial neoplasia II) 07/07/2017  . 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 Right ankle pain 04/19/2017  . Morbid obesity (HCC) 02/01/2017  . PTSD (post-traumatic stress disorder) 07/26/2013  . MDD (major depressive disorder), recurrent severe, 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    Past Surgical History:  Procedure Laterality Date  . BREAST SURGERY     REDUCTI0N  . CERVICAL CONIZATION W/BX N/A 05/27/2017   Procedure: CONIZATION CERVIX WITH BIOPSY - COLD KNIFE;  Surgeon: Allie Bossierove, Myra C, MD;  Location: [facility];  Service: Gynecology;  Laterality: N/A;  . KNEE ARTHROSCOPY Left   . TONSILLECTOMY      OB History    Gravida  0   Para  0   Term  0   Preterm  0   AB  0   Living  0     SAB  0   TAB  0   Ectopic  0   Multiple  0   Live Births               Home Medications    Prior to Admission medications   Medication Sig Start Date End Date Taking? Authorizing Provider  albuterol (VENTOLIN HFA) 108 (90 Base) MCG/ACT inhaler Inhale 1-2 puffs into the lungs every 6 (six) hours as needed for wheezing or shortness of breath. 01/05/19   Fabiana Dromgoole, Britta MccreedyPhilip O, MD  Etonogestrel [facility](IMPLANON [place]) Inject into the skin once.    [provider]  ibuprofen (ADVIL,MOTRIN) 600 MG tablet Take 1 tablet (600 mg total) by mouth every 6 (six) hours as needed. 05/27/17   Allie Bossierove, Myra C, MD  Melatonin 5 MG TABS Take 5 mg by mouth at bedtime.    [provider]  methocarbamol (ROBAXIN) 500 MG tablet Take 2 tablets (1,000 mg total) by mouth 4 (four) times daily. 02/27/18   Renne CriglerGeiple, Joshua, PA-C  mineral oil-hydrophilic petrolatum (AQUAPHOR) ointment Apply topically as needed for dry skin. 09/20/18   Horton, Mayer Maskerourtney F, MD  naproxen (NAPROSYN) 500 MG tablet Take 1 tablet (500 mg total) by mouth 2 (two) times daily. 02/27/18   Renne CriglerGeiple, Joshua, PA-C  oxyCODONE-acetaminophen (PERCOCET/ROXICET) 5-325 MG tablet Take 1-2 tablets by mouth every 6 (six) hours as  needed. 05/27/17   Emily Filbert, MD  phentermine 15 MG capsule Take 1 capsule (15 mg total) by mouth every morning. 05/24/17   Saguier, Percell Miller, PA-C  triamcinolone cream (KENALOG) 0.1 % Apply 1 application topically 2 (two) times daily. 09/20/18   Horton, Barbette Hair, MD    Family History Family History  Problem Relation Age of Onset  . Diabetes Father     Social History Social History   Tobacco Use  . Smoking status: Former Smoker    Types: Cigarettes    Quit date: 12/07/2015    Years since quitting: 3.0  . Smokeless tobacco: Never Used  Substance Use Topics  . Alcohol use: Yes    Comment: occasional beer  . Drug use: No     Allergies   Patient has no known allergies.   Review of Systems Review of Systems  Constitutional: Positive for activity change, chills,  fatigue and fever.  HENT: Positive for congestion, rhinorrhea and sore throat. Negative for ear discharge, ear pain, postnasal drip, sinus pressure and sinus pain.   Eyes: Negative.   Respiratory: Positive for chest tightness and shortness of breath. Negative for cough, wheezing and stridor.   Gastrointestinal: Negative for abdominal distention, abdominal pain, diarrhea, nausea and vomiting.  Genitourinary: Positive for difficulty urinating, dysuria, frequency and urgency.  Skin: Negative for rash and wound.  Neurological: Negative for dizziness, tremors, weakness, numbness and headaches.  Psychiatric/Behavioral: Negative.      Physical Exam Triage Vital Signs ED Triage Vitals  Enc Vitals Group     BP 01/05/19 1206 133/84     Pulse Rate 01/05/19 1206 82     Resp 01/05/19 1206 18     Temp 01/05/19 1206 98.1 F (36.7 C)     Temp Source 01/05/19 1206 Temporal     SpO2 01/05/19 1206 98 %     Weight --      Height --      Head Circumference --      Peak Flow --      Pain Score 01/05/19 1207 5     Pain Loc --      Pain Edu? --      Excl. in [place]? --    No data found.  Updated Vital Signs BP 133/84 (BP Location: Right Arm)   Pulse 82   Temp 98.1 F (36.7 C) (Temporal)   Resp 18   SpO2 98%   Visual Acuity Right Eye Distance:   Left Eye Distance:   Bilateral Distance:    Right Eye Near:   Left Eye Near:    Bilateral Near:     Physical Exam Constitutional:      General: She is not in acute distress.    Appearance: She is ill-appearing. She is not toxic-appearing.  HENT:     Nose: Congestion present.     Mouth/Throat:     Mouth: Mucous membranes are moist.     Pharynx: No oropharyngeal exudate or posterior oropharyngeal erythema.  Eyes:     Extraocular Movements: Extraocular movements intact.     Conjunctiva/sclera: Conjunctivae normal.     Pupils: Pupils are equal, round, and reactive to light.  Cardiovascular:     Rate and Rhythm: Normal rate and regular rhythm.      Pulses: Normal pulses.     Heart sounds: Normal heart sounds.  Pulmonary:     Effort: Pulmonary effort is normal. No respiratory distress.     Breath sounds: Normal breath sounds. No wheezing,  rhonchi or rales.  Abdominal:     General: Bowel sounds are normal.     Palpations: Abdomen is soft.  Musculoskeletal: Normal range of motion.  Skin:    General: Skin is warm.     Capillary Refill: Capillary refill takes less than 2 seconds.     Findings: No bruising or erythema.  Neurological:     General: No focal deficit present.     Mental Status: She is alert and oriented to person, place, and time.      UC Treatments / Results  Labs (all labs ordered are listed, but only abnormal results are displayed) Labs Reviewed  NOVEL CORONAVIRUS, NAA (HOSP ORDER, SEND-OUT TO REF LAB; TAT 18-24 HRS)    EKG   Radiology No results found.  Procedures Procedures (including critical care time)  Medications Ordered in UC Medications - No data to display  Initial Impression / Assessment and Plan / UC Course  I have reviewed the triage vital signs and the nursing notes.  Pertinent labs & imaging results that were available during my care of the patient were reviewed by me and considered in my medical decision making (see chart for details).    1.  Flulike illness: COVID 19 test Chest x-ray Patient is advised to self isolate till the results are ready If patient's condition gets worse, she is advised to return to urgent care to be reevaluated. Albuterol inhaler to be used as needed for shortness of breath. Patient is advised to optimize her oral fluid intake. Final Clinical Impressions(s) / UC Diagnoses   Final diagnoses:  Flu-like symptoms   Discharge Instructions   None    ED Prescriptions    Medication Sig Dispense Auth. Provider   albuterol (VENTOLIN HFA) 108 (90 Base) MCG/ACT inhaler Inhale 1-2 puffs into the lungs every 6 (six) hours as needed for wheezing or shortness  of breath. 6.7 g Merrilee Jansky, MD     Controlled Substance Prescriptions [place] Controlled Substance Registry consulted? No   Merrilee Jansky, MD 01/05/19 1254    Merrilee Jansky, MD 01/05/19 1640

## 2019-01-06 ENCOUNTER — Encounter (HOSPITAL_COMMUNITY): Payer: Self-pay

## 2019-01-06 LAB — NOVEL CORONAVIRUS, NAA (HOSP ORDER, SEND-OUT TO REF LAB; TAT 18-24 HRS): SARS-CoV-2, NAA: NOT DETECTED

## 2019-05-30 ENCOUNTER — Telehealth: Payer: Self-pay | Admitting: Obstetrics & Gynecology

## 2019-05-30 NOTE — Telephone Encounter (Signed)
Pt called and reports her Nexplanon will expire in September 2021. She is not planning to have children. She does not have insurance. She would like to know what options are for longer term Birth control. She was wondering if long term Birth Control is an issue. Discussed I would encourage her to have a virtual visit with a provider to discuss her options. Pt voiced understanding.

## 2019-05-30 NOTE — Telephone Encounter (Signed)
Patient would like a call back to see about birth control options.

## 2019-06-20 ENCOUNTER — Encounter: Payer: Self-pay | Admitting: Student

## 2019-06-20 ENCOUNTER — Telehealth: Payer: Medicaid Other | Admitting: Student

## 2019-06-20 NOTE — Progress Notes (Signed)
@  422pm LVM that I will call back for appointment in 5 min's and to be available by phone @426pm  LVM to call and reschedule appointment

## 2019-06-20 NOTE — Progress Notes (Signed)
Patient did not keep appointment today.  Judeth Horn, NP

## 2019-08-14 ENCOUNTER — Ambulatory Visit (INDEPENDENT_AMBULATORY_CARE_PROVIDER_SITE_OTHER): Payer: Medicaid Other | Admitting: Nurse Practitioner

## 2019-08-14 ENCOUNTER — Encounter: Payer: Self-pay | Admitting: Nurse Practitioner

## 2019-08-14 ENCOUNTER — Other Ambulatory Visit (HOSPITAL_COMMUNITY)
Admission: RE | Admit: 2019-08-14 | Discharge: 2019-08-14 | Disposition: A | Discharge status: Home / Self Care | Payer: Medicaid Other | Source: Ambulatory Visit | Attending: Nurse Practitioner | Admitting: Nurse Practitioner

## 2019-08-14 ENCOUNTER — Other Ambulatory Visit: Payer: Self-pay

## 2019-08-14 VITALS — BP 107/70 | HR 109 | Ht 66.0 in | Wt 322.0 lb

## 2019-08-14 DIAGNOSIS — Z975 Presence of (intrauterine) contraceptive device: Secondary | ICD-10-CM

## 2019-08-14 DIAGNOSIS — Z01419 Encounter for gynecological examination (general) (routine) without abnormal findings: Secondary | ICD-10-CM

## 2019-08-14 DIAGNOSIS — Z8742 Personal history of other diseases of the female genital tract: Secondary | ICD-10-CM | POA: Diagnosis not present

## 2019-08-14 DIAGNOSIS — Z3009 Encounter for other general counseling and advice on contraception: Secondary | ICD-10-CM

## 2019-08-14 DIAGNOSIS — R87619 Unspecified abnormal cytological findings in specimens from cervix uteri: Secondary | ICD-10-CM

## 2019-08-14 DIAGNOSIS — Z3046 Encounter for surveillance of implantable subdermal contraceptive: Secondary | ICD-10-CM | POA: Diagnosis not present

## 2019-08-14 NOTE — Progress Notes (Signed)
GYNECOLOGY OFFICE VISIT NOTE   History:  29 y.o. G0P0000 here today for consult about Nexplanon removal due in September.  Chart reviewed.  She had seen Dr. Marice Potter previously who agreed to perform a BTl as client does nto want any children and wants contraception long term.  Is afraid of losing health insurance and not able to get BTL later.  Dr. Marice Potter no logner at this office. Her usual menses before Nexplanon (starting at age 26) was heavy painful menses.  She denies any abnormal vaginal discharge, bleeding, pelvic pain or other concerns.   Past Medical History:  Diagnosis Date  . Anemia   . Anxiety   . Asthma   . Bipolar depression (HCC)   . Chronic kidney disease    UTI AND FREQUENT KIDNEY INFECTIONS FREQUENTLY  . Depression   . Pneumonia 2017  . PTSD (post-traumatic stress disorder)   . Sleep apnea    not using c-pap    Past Surgical History:  Procedure Laterality Date  . BREAST SURGERY     REDUCTI0N  . CERVICAL CONIZATION W/BX N/A 05/27/2017   Procedure: CONIZATION CERVIX WITH BIOPSY - COLD KNIFE;  Surgeon: Allie Bossier, MD;  Location: [facility];  Service: Gynecology;  Laterality: N/A;  . KNEE ARTHROSCOPY Left   . TONSILLECTOMY      The following portions of the patient's history were reviewed and updated as appropriate: allergies, current medications, past family history, past medical history, past social history, past surgical history and problem list.   Health Maint enance:  Pap overdue - did not have pap after last colpo.   Review of Systems:  Pertinent items noted in HPI and remainder of comprehensive ROS otherwise negative.  Objective:  Physical Exam BP 107/70   Pulse (!) 109   Ht 5\' 6"  (1.676 m)   Wt (!) 322 lb (146.1 kg)   LMP 07/14/2019 (Within Days)   BMI 51.97 kg/m  CONSTITUTIONAL: Well-developed, well-nourished female in no acute distress.  HENT:  Normocephalic, atraumatic. External right and left ear normal.  EYES: Conjunctivae and EOM are normal.  Pupils are equal, round.  No scleral icterus.  NECK: Normal range of motion, supple, no masses SKIN: Skin is warm and dry. No rash noted. Not diaphoretic. No erythema. No pallor. NEUROLOGIC: Alert and oriented to person, place, and time. Normal muscle tone coordination. No cranial nerve deficit noted. PSYCHIATRIC: Normal mood and affect. Normal behavior. Normal judgment and thought content. CARDIOVASCULAR: Normal heart rate noted RESPIRATORY: Effort and breath sounds normal, no problems with respiration noted ABDOMEN: Soft, no distention noted.   PELVIC: Normal appearing external genitalia; normal appearing vaginal mucosa and cervix.  No abnormal discharge noted.  Normal uterine size, no other palpable masses, no uterine or adnexal tenderness. Pap done  Hard to visualize entire cervix and there was a small amount of blood.  Examplimited by habitus but all is nontender. MUSCULOSKELETAL: Normal range of motion. No edema noted.  Labs and Imaging No results found.  Assessment & Plan:  1. Well woman exam Check for infection due to unexpected blood in the vagina. Reviewed BTL as a contraceptive choice - still will have long, heavy and painful menses that she does not have with Nexplanon.   Was difficult for patient to have speculum exam and cervix not well visualized as has a deep muscular ring in vagina.  Reviewed that IUD insertion might be difficult - if decides to use IUD, would need MD to do cervical block to have  IUD inserted.  - Cytology - PAP( [facility]) - Cervicovaginal ancillary only( [place])  2. Abnormal cervical Papanicolaou smear, unspecified abnormal pap finding Will watch findings  3. Family planning counseling See above - planning to keep Nexplanon for now - has had for 11 years.  4. History of heavy periods Managed well with Nexplanon   Routine preventative health maintenance measures emphasized. Please refer to After Visit Summary for other counseling  recommendations.   No follow-ups on file.  Return in one year.   Total face-to-face time with patient: 15 minutes.  Over 50% of encounter was spent on counseling and coordination of care.  Earlie Server, RN, MSN, NP-BC Nurse Practitioner, [facility] for [employer], [facility] 08/14/2019 8:58 PM

## 2019-08-15 ENCOUNTER — Telehealth: Payer: Self-pay | Admitting: Family Medicine

## 2019-08-15 LAB — CYTOLOGY - PAP
Chlamydia: NEGATIVE
Comment: NEGATIVE
Comment: NEGATIVE
Comment: NORMAL
Diagnosis: REACTIVE
High risk HPV: NEGATIVE
Neisseria Gonorrhea: NEGATIVE

## 2019-08-15 LAB — CERVICOVAGINAL ANCILLARY ONLY
Bacterial Vaginitis (gardnerella): POSITIVE — AB
Candida Glabrata: NEGATIVE
Candida Vaginitis: NEGATIVE
Comment: NEGATIVE
Comment: NEGATIVE
Comment: NEGATIVE
Comment: NEGATIVE
Trichomonas: POSITIVE — AB

## 2019-08-15 MED ORDER — METRONIDAZOLE 500 MG PO TABS: 500.0000 mg | ORAL_TABLET | Freq: Two times a day (BID) | ORAL | 0 refills | Status: DC

## 2019-08-15 NOTE — Telephone Encounter (Signed)
Please call patient back with results ASAP per Patient request.

## 2019-08-15 NOTE — Addendum Note (Signed)
Addended by: Currie Paris on: 08/15/2019 09:28 PM   Modules accepted: Orders

## 2019-08-16 ENCOUNTER — Telehealth: Payer: Self-pay | Admitting: Nurse Practitioner

## 2019-08-16 NOTE — Telephone Encounter (Signed)
Patient called in stating that she received her test results in her mychart and needs to know if she needs to take antibiotics or not. Patient instructed that a message will be sent to the nurses and they will reach out to her as soon as they can. Patient verbalized understanding. Message sent to clinical pool.

## 2019-08-16 NOTE — Telephone Encounter (Signed)
Called patient stating I was calling her back as I saw she had called previously and has seen the message Kaitlyn Luna sent in. I just wanted to make sure she didn't have any questions or needs. Patient verbalized understanding & states no she is good. Patient had no questions.

## 2019-08-29 ENCOUNTER — Telehealth: Payer: Self-pay | Admitting: *Deleted

## 2019-08-29 NOTE — Telephone Encounter (Signed)
Pt left VM message stating that she was seen 2 weeks ago and got an antibiotic. She does not feel any different and doesn't think it worked to treat her problem. Please call back.

## 2019-08-30 ENCOUNTER — Telehealth: Payer: Self-pay | Admitting: Obstetrics and Gynecology

## 2019-08-30 NOTE — Telephone Encounter (Signed)
Patient is requesting a call back. She is still have symptoms, and was not able to take all her medication.

## 2019-09-05 NOTE — Telephone Encounter (Signed)
Pt concern addressed in an another encounter.  Addison Naegeli, RN

## 2019-09-05 NOTE — Telephone Encounter (Signed)
Called pt and left message that I apologize for just calling her back but if she continues to have questions or concerns to please give the office.

## 2019-09-14 ENCOUNTER — Telehealth: Payer: Self-pay | Admitting: Family Medicine

## 2019-09-14 ENCOUNTER — Telehealth (INDEPENDENT_AMBULATORY_CARE_PROVIDER_SITE_OTHER): Payer: Self-pay | Admitting: Lactation Services

## 2019-09-14 DIAGNOSIS — N898 Other specified noninflammatory disorders of vagina: Secondary | ICD-10-CM

## 2019-09-14 NOTE — Telephone Encounter (Signed)
Returned patients call. Patient did not answer. LM for patient to call the office or send a My Chart message at her convenience.

## 2019-09-14 NOTE — Telephone Encounter (Signed)
Patient called in stating that she was been trying to contact a nurse for almost a month now and no one has return her call yet. She is currently being treated for an STD and is still having symptoms. Patient stated that she was unable to complete all of the treatment because one got destroyed. Patient instructed that a message will be sent to the nurses and someone will contact her as soon as they can. Patient verbalized understanding. Message sent to clinical pool.

## 2019-09-14 NOTE — Telephone Encounter (Addendum)
Patient returned call. Patients reports that she is having burning and discharge and foul odor on her vaginal area. Urine smells foul. She reports she feels the same as she did before treatment. She took all but one pill of her Flagyl course as it got wet and disintegrated.   She denies frequency or urgency with voiding. She is having burning with voiding.   She reports she is not having sex and her partner was treated.   Informed patient she will need to come in for vaginal swabs and to have urine checked. Informed patient that front desk will call to schedule her for Nurse visit. Message to front desk to call patient to schedule nurse visit.

## 2019-09-19 ENCOUNTER — Other Ambulatory Visit (HOSPITAL_COMMUNITY)
Admission: RE | Admit: 2019-09-19 | Discharge: 2019-09-19 | Disposition: A | Discharge status: Home / Self Care | Payer: Medicaid Other | Source: Ambulatory Visit | Attending: Student | Admitting: Student

## 2019-09-19 ENCOUNTER — Ambulatory Visit (INDEPENDENT_AMBULATORY_CARE_PROVIDER_SITE_OTHER): Payer: Self-pay | Admitting: *Deleted

## 2019-09-19 ENCOUNTER — Other Ambulatory Visit: Payer: Self-pay

## 2019-09-19 DIAGNOSIS — N898 Other specified noninflammatory disorders of vagina: Secondary | ICD-10-CM | POA: Insufficient documentation

## 2019-09-19 DIAGNOSIS — R300 Dysuria: Secondary | ICD-10-CM

## 2019-09-19 LAB — POCT URINALYSIS DIP (DEVICE)
Bilirubin Urine: NEGATIVE
Glucose, UA: NEGATIVE mg/dL
Ketones, ur: NEGATIVE mg/dL
Nitrite: NEGATIVE
Protein, ur: NEGATIVE mg/dL
Specific Gravity, Urine: >=1.03 (ref 1.005–1.030)
Urobilinogen, UA: 0.2 mg/dL (ref 0.0–1.0)
pH: 5.5 (ref 5.0–8.0)

## 2019-09-19 MED ORDER — FLUCONAZOLE 150 MG PO TABS: 150.0000 mg | ORAL_TABLET | Freq: Once | ORAL | 0 refills | Status: AC

## 2019-09-19 NOTE — Progress Notes (Signed)
Here today for self swab and to check for uti. States was treated for std about a month ago and not sure if it cleared up.  States took all the medicine but last pill got wet and disentegrated- not sure if she got all of it. Per chart was + for trich and BV and treated.  States partner was treated for trich and has not had intercourse. C/o burning when she urinates and urine was cloudy. Also c/o vaginal discharge and bad smell like before.  States she is on her period but not heavy. Will do self swab and has already obtained a clean catch urine for today. Also c;/o vaginal itching and thinks she has a yeast infection. Will send rx for diflucan for yeast. Will wait for results of self swab and urine culture and treat accordingly.  Kailia Starry,RN

## 2019-09-20 LAB — URINE CULTURE

## 2019-09-20 NOTE — Progress Notes (Signed)
I have reviewed this chart and agree with the RN/CMA assessment and management.    K. Meryl Rorey Bisson, M.D. Attending [facility] (Faculty [facility])   

## 2019-09-21 ENCOUNTER — Telehealth (INDEPENDENT_AMBULATORY_CARE_PROVIDER_SITE_OTHER): Payer: Self-pay | Admitting: Lactation Services

## 2019-09-21 ENCOUNTER — Other Ambulatory Visit: Payer: Self-pay | Admitting: Obstetrics and Gynecology

## 2019-09-21 ENCOUNTER — Telehealth: Payer: Self-pay | Admitting: Lactation Services

## 2019-09-21 ENCOUNTER — Telehealth: Payer: Self-pay | Admitting: Family Medicine

## 2019-09-21 DIAGNOSIS — B75 Trichinellosis: Secondary | ICD-10-CM

## 2019-09-21 LAB — CERVICOVAGINAL ANCILLARY ONLY
Candida Glabrata: NEGATIVE
Candida Vaginitis: NEGATIVE
Chlamydia: NEGATIVE
Comment: NEGATIVE
Comment: NEGATIVE
Comment: NEGATIVE
Comment: NEGATIVE
Comment: NORMAL
Neisseria Gonorrhea: NEGATIVE
Trichomonas: POSITIVE — AB

## 2019-09-21 MED ORDER — METRONIDAZOLE 500 MG PO TABS: ORAL_TABLET | ORAL | 0 refills | Status: DC

## 2019-09-21 NOTE — Telephone Encounter (Signed)
Patient called and she was given information that she is + for Trich again and that meds have been sent to her Pharmacy. She is aware to take the entire dosage at the same time. Patient voiced understanding.   She reports she is not having sexual intercourse and her partner has been treated.

## 2019-09-21 NOTE — Telephone Encounter (Signed)
Calling for test results ?

## 2019-09-21 NOTE — Telephone Encounter (Signed)
Attempted to call patient again. Message went to voicemail. LM for patient to call the office for results.

## 2019-09-21 NOTE — Telephone Encounter (Signed)
Called patient and she reports she is at work and cannot talk right now, she will call the office back .

## 2019-09-21 NOTE — Telephone Encounter (Signed)
-----   Message from Kaitlyn Bowens, MD sent at 09/21/2019 11:09 AM EDT ----- Positive trich, please call and let patient know, script sent to pharmacy

## 2019-10-09 ENCOUNTER — Encounter: Payer: Self-pay | Admitting: Student

## 2019-10-12 ENCOUNTER — Other Ambulatory Visit: Payer: Self-pay

## 2019-10-12 ENCOUNTER — Emergency Department (HOSPITAL_COMMUNITY)
Admission: EM | Admit: 2019-10-12 | Discharge: 2019-10-12 | Disposition: A | Discharge status: Home / Self Care | Payer: No Typology Code available for payment source | Attending: Emergency Medicine | Admitting: Emergency Medicine

## 2019-10-12 ENCOUNTER — Encounter (HOSPITAL_COMMUNITY): Payer: Self-pay | Admitting: Emergency Medicine

## 2019-10-12 DIAGNOSIS — M25512 Pain in left shoulder: Secondary | ICD-10-CM | POA: Diagnosis not present

## 2019-10-12 DIAGNOSIS — Z87891 Personal history of nicotine dependence: Secondary | ICD-10-CM | POA: Diagnosis not present

## 2019-10-12 DIAGNOSIS — J45909 Unspecified asthma, uncomplicated: Secondary | ICD-10-CM | POA: Diagnosis not present

## 2019-10-12 DIAGNOSIS — M898X1 Other specified disorders of bone, shoulder: Secondary | ICD-10-CM

## 2019-10-12 MED ORDER — PREDNISONE 10 MG PO TABS: 40.0000 mg | ORAL_TABLET | Freq: Every day | ORAL | 0 refills | Status: AC

## 2019-10-12 MED ORDER — METHOCARBAMOL 500 MG PO TABS: 500.0000 mg | ORAL_TABLET | Freq: Two times a day (BID) | ORAL | 0 refills | Status: DC

## 2019-10-12 NOTE — Discharge Instructions (Signed)
I have prescribed you 2 new medications.  The first medication is called Robaxin.  This is a strong muscle relaxer that can also have a sedating effect.  Please do not mix with alcohol.  Please do not operate a motor vehicle after taking this.  I am also prescribing you prednisone.  This is a steroid medication.  He can have a stimulating effect so please do not take it late in the day.  Please follow-up with your orthopedist regarding your symptoms.  Please make sure to go to your regularly scheduled MRI next week.  If your symptoms worsen you can always return to the emergency department.  It was a pleasure to meet you.

## 2019-10-12 NOTE — ED Provider Notes (Signed)
[facility] DEPT Provider Note   CSN: [id number] Arrival date & time: 10/12/19  1123     History Chief Complaint  Patient presents with  . Shoulder Pain    Kaitlyn Luna is a 29 y.o. female.  HPI HPI Comments: Kaitlyn Luna is a 29 y.o. female with a medical history as noted below who presents to the Emergency Department complaining of chronic left shoulder pain.  Patient states she was in an [place] in March of this year.  Since, she has been experiencing this pain.  She has been evaluated by Dr. Amedeo Plenty at [facility].  She has an MRI of her cervical spine scheduled in 1 week.  She does not have health insurance.  She has been taking ibuprofen and gabapentin with short-term relief.  She states her pain is worsened recently.  When she moves her left upper extremity she gets pain in the left scapular region that radiates up her left neck.  She describes the pain as "electrical".  She denies any other complaints at this time including but not limited to fevers, chills, chest pain, shortness of breath, abdominal pain, nausea, vomiting, diarrhea.      Past Medical History:  Diagnosis Date  . Anemia   . Anxiety   . Asthma   . Bipolar depression ([place])   . Chronic kidney disease    UTI AND FREQUENT KIDNEY INFECTIONS FREQUENTLY  . Depression   . Pneumonia 2017  . PTSD (post-traumatic stress disorder)   . Sleep apnea    not using c-pap    Patient Active Problem List   Diagnosis Date Noted  . History of heavy periods 08/14/2019  . CIN II (cervical intraepithelial neoplasia II) 07/07/2017  . 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 Right ankle pain 04/19/2017  . Morbid obesity ([place]) 02/01/2017  . PTSD (post-traumatic stress disorder) 07/26/2013  . MDD (major depressive disorder), recurrent severe, without psychosis ([place]) 07/26/2013    Past Surgical History:  Procedure Laterality Date  . BREAST SURGERY     REDUCTI0N  . CERVICAL CONIZATION W/BX N/A  05/27/2017   Procedure: CONIZATION CERVIX WITH BIOPSY - COLD KNIFE;  Surgeon: Emily Filbert, MD;  Location: [facility];  Service: Gynecology;  Laterality: N/A;  . KNEE ARTHROSCOPY Left   . TONSILLECTOMY       OB History    Gravida  0   Para  0   Term  0   Preterm  0   AB  0   Living  0     SAB  0   TAB  0   Ectopic  0   Multiple  0   Live Births              Family History  Problem Relation Age of Onset  . Diabetes Father     Social History   Tobacco Use  . Smoking status: Former Smoker    Types: Cigarettes    Quit date: 12/07/2015    Years since quitting: 3.8  . Smokeless tobacco: Never Used  Substance Use Topics  . Alcohol use: Yes    Comment: occasional beer  . Drug use: No    Home Medications Prior to Admission medications   Medication Sig Start Date End Date Taking? Authorizing Provider  albuterol (VENTOLIN HFA) 108 (90 Base) MCG/ACT inhaler Inhale 1-2 puffs into the lungs every 6 (six) hours as needed for wheezing or shortness of breath. 01/05/19   Lamptey, Myrene Galas, MD  Etonogestrel (IMPLANON [place]) Inject into the skin once.    [provider]  ibuprofen (ADVIL,MOTRIN) 600 MG tablet Take 1 tablet (600 mg total) by mouth every 6 (six) hours as needed. 05/27/17   Allie Bossier, MD  Melatonin 5 MG TABS Take 5 mg by mouth at bedtime.    [provider]  metroNIDAZOLE (FLAGYL) 500 MG tablet Take 1 tablet (500 mg total) by mouth 2 (two) times daily. No alcohol while taking this medication 08/15/19   Currie Paris, NP  metroNIDAZOLE (FLAGYL) 500 MG tablet Take two tablets by mouth twice a day, for one day.  Or you can take all four tablets at once if you can tolerate it. 09/21/19   Conan Bowens, MD  mineral oil-hydrophilic petrolatum (AQUAPHOR) ointment Apply topically as needed for dry skin. 09/20/18   Horton, Mayer Masker, MD  naproxen (NAPROSYN) 500 MG tablet Take 1 tablet (500 mg total) by mouth 2 (two) times daily. 02/27/18   Renne Crigler,  PA-C    Allergies    Lavender oil  Review of Systems   Review of Systems  All other systems reviewed and are negative. Ten systems reviewed and are negative for acute change, except as noted in the HPI.    Physical Exam Updated Vital Signs BP (!) 121/91   Pulse 87   Temp 98.7 F (37.1 C) (Oral)   Resp 19   SpO2 98%   Physical Exam Vitals and nursing note reviewed.  Constitutional:      General: She is in acute distress.     Appearance: Normal appearance. She is obese. She is not ill-appearing, toxic-appearing or diaphoretic.  HENT:     Head: Normocephalic and atraumatic.     Right Ear: External ear normal.     Left Ear: External ear normal.     Nose: Nose normal.     Mouth/Throat:     Mouth: Mucous membranes are moist.     Pharynx: Oropharynx is clear. No oropharyngeal exudate or posterior oropharyngeal erythema.  Eyes:     Extraocular Movements: Extraocular movements intact.  Neck:     Comments: Full range of motion of the cervical spine. Cardiovascular:     Rate and Rhythm: Normal rate and regular rhythm.     Pulses: Normal pulses.     Heart sounds: Normal heart sounds. No murmur. No friction rub. No gallop.      Comments: 2+ radial pulses noted bilaterally. Pulmonary:     Effort: Pulmonary effort is normal. No respiratory distress.     Breath sounds: Normal breath sounds. No stridor. No wheezing, rhonchi or rales.  Abdominal:     General: Abdomen is flat.     Tenderness: There is no abdominal tenderness.  Musculoskeletal:        General: Normal range of motion.     Cervical back: Normal range of motion and neck supple. No tenderness.     Comments: Full active and passive range of motion of the bilateral upper extremities.  Moderate TTP noted along the left scapular region.  Pain worsens with movement of the left upper extremity.  No midline C, T, L-spine tenderness.  Skin:    General: Skin is warm and dry.  Neurological:     General: No focal deficit present.      Mental Status: She is alert and oriented to person, place, and time.     Comments: Distal sensation intact in the bilateral upper extremities.  Strength is 5 out  of 5 in the bilateral upper extremities.  Grip strength is 5 out of 5.    Psychiatric:        Mood and Affect: Mood normal.        Behavior: Behavior normal.    ED Results / Procedures / Treatments   Labs (all labs ordered are listed, but only abnormal results are displayed) Labs Reviewed - No data to display  EKG None  Radiology No results found.  Procedures Procedures (including critical care time)  Medications Ordered in ED Medications - No data to display  ED Course  I have reviewed the triage vital signs and the nursing notes.  Pertinent labs & imaging results that were available during my care of the patient were reviewed by me and considered in my medical decision making (see chart for details).    MDM Rules/Calculators/A&P                      Patient is a 29 year old female that presents with chronic left scapular pain.  She has been evaluated by an orthopedist for this.  She has an MRI scheduled for next week.  Physical exam is reassuring.  Differential includes musculoskeletal pain versus radiculopathy.  Patient is currently taking ibuprofen and gabapentin.  I am going to additionally prescribe a short course of prednisone as well as Robaxin.  We discussed safety with both of these medications.  She is not diabetic.  I recommended that she make sure to get her MRI next week.  She understands she can return to the emergency department any new or worsening symptoms.  I also recommended that she reach out to her orthopedist regarding her symptoms at this visit.  Her questions were answered and she was amicable at the time of discharge.  Her vital signs are stable.  Final Clinical Impression(s) / ED Diagnoses Final diagnoses:  Pain of left scapula    Rx / DC Orders ED Discharge Orders          Ordered    predniSONE (DELTASONE) 10 MG tablet  Daily with breakfast     10/12/19 1313    methocarbamol (ROBAXIN) 500 MG tablet  2 times daily     10/12/19 1313           Placido Sou, PA-C 10/12/19 1317    Terald Sleeper, MD 10/12/19 1757
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
## 2019-10-19 ENCOUNTER — Ambulatory Visit (INDEPENDENT_AMBULATORY_CARE_PROVIDER_SITE_OTHER): Payer: Self-pay

## 2019-10-19 ENCOUNTER — Other Ambulatory Visit: Payer: Self-pay

## 2019-10-19 ENCOUNTER — Other Ambulatory Visit (HOSPITAL_COMMUNITY)
Admission: RE | Admit: 2019-10-19 | Discharge: 2019-10-19 | Disposition: A | Discharge status: Home / Self Care | Payer: Medicaid Other | Source: Ambulatory Visit | Attending: Obstetrics and Gynecology | Admitting: Obstetrics and Gynecology

## 2019-10-19 DIAGNOSIS — N898 Other specified noninflammatory disorders of vagina: Secondary | ICD-10-CM | POA: Diagnosis not present

## 2019-10-19 NOTE — Progress Notes (Signed)
Pt here today for self swab for vaginal discharge and odor.   Pt reports that she has had Trich x 2 and it somewhat smells like it however she has not had sex recently.  Pt already knows how to obtain self swab due prior visits.  Pt explained that we will call with abnormal results and to get normal results via MyChart.    Addison Naegeli, RN  10/19/19

## 2019-10-19 NOTE — Addendum Note (Signed)
Addended by: Faythe Casa on: 10/19/2019 04:58 PM   Modules accepted: Orders

## 2019-10-19 NOTE — Progress Notes (Deleted)
Pt

## 2019-10-19 NOTE — Progress Notes (Signed)
Chart reviewed for nurse visit. Agree with plan of care.   Duane Lope, NP 10/19/2019 2:48 PM

## 2019-10-20 LAB — CERVICOVAGINAL ANCILLARY ONLY
Bacterial Vaginitis (gardnerella): POSITIVE — AB
Candida Glabrata: NEGATIVE
Candida Vaginitis: NEGATIVE
Chlamydia: NEGATIVE
Comment: NEGATIVE
Comment: NEGATIVE
Comment: NEGATIVE
Comment: NEGATIVE
Comment: NEGATIVE
Comment: NORMAL
Neisseria Gonorrhea: NEGATIVE
Trichomonas: POSITIVE — AB

## 2019-10-23 ENCOUNTER — Telehealth: Payer: Self-pay | Admitting: Lactation Services

## 2019-10-23 ENCOUNTER — Encounter: Payer: Self-pay | Admitting: Lactation Services

## 2019-10-23 NOTE — Telephone Encounter (Signed)
Opened in error

## 2019-10-24 ENCOUNTER — Encounter: Payer: Self-pay | Admitting: Lactation Services

## 2019-10-24 ENCOUNTER — Other Ambulatory Visit: Payer: Self-pay | Admitting: Lactation Services

## 2019-10-24 MED ORDER — METRONIDAZOLE 500 MG PO TABS: 500.0000 mg | ORAL_TABLET | Freq: Two times a day (BID) | ORAL | 0 refills | Status: DC

## 2019-10-26 ENCOUNTER — Telehealth: Payer: Self-pay | Admitting: Lactation Services

## 2019-10-26 NOTE — Telephone Encounter (Signed)
Patient prescribed prescription on 6/15.

## 2019-10-26 NOTE — Telephone Encounter (Signed)
-----   Message from Duane Lope, NP sent at 10/26/2019  9:02 AM EDT ----- Please call patient and notify of results. She was an [employer] fund[employer] visit. Flagyl 500 mg BID X 7 days. No alcohol while taking this.

## 2019-11-14 ENCOUNTER — Ambulatory Visit: Payer: Medicaid Other

## 2019-11-21 ENCOUNTER — Ambulatory Visit (INDEPENDENT_AMBULATORY_CARE_PROVIDER_SITE_OTHER): Payer: Self-pay | Admitting: *Deleted

## 2019-11-21 ENCOUNTER — Other Ambulatory Visit (HOSPITAL_COMMUNITY)
Admission: RE | Admit: 2019-11-21 | Discharge: 2019-11-21 | Disposition: A | Discharge status: Home / Self Care | Payer: Medicaid Other | Source: Ambulatory Visit | Attending: Family Medicine | Admitting: Family Medicine

## 2019-11-21 ENCOUNTER — Other Ambulatory Visit: Payer: Self-pay

## 2019-11-21 ENCOUNTER — Encounter: Payer: Self-pay | Admitting: *Deleted

## 2019-11-21 VITALS — BP 119/69 | HR 71 | Ht 67.0 in | Wt 332.7 lb

## 2019-11-21 DIAGNOSIS — N898 Other specified noninflammatory disorders of vagina: Secondary | ICD-10-CM

## 2019-11-21 DIAGNOSIS — Z8619 Personal history of other infectious and parasitic diseases: Secondary | ICD-10-CM

## 2019-11-21 NOTE — Progress Notes (Signed)
Pt reports that she is still having malodorous vaginal discharge. She thinks that the medication for trich and BV did not work because her symptoms are the same. She is feeling extremely frustrated. Pt states that she has not had sex since end of March. She tested positive for trich on 4/5, 5/11 and 6/10. She was treated all 3 times with Metronidazole 500 mg BID x7 days. If treatment is required again, pt requests Rx for different medication. Self swab obtained for test of cure. Pt was offered testing for RPR and HIV and accepted. Pt was advised that she will be notified of results and treatment if indicated via Mychart. She voiced understanding.

## 2019-11-21 NOTE — Progress Notes (Signed)
Chart reviewed for nurse visit. Agree with plan of care.   Marny Lowenstein, PA-C 11/21/2019 12:44 PM

## 2019-11-22 LAB — HIV ANTIBODY (ROUTINE TESTING W REFLEX): HIV Screen 4th Generation wRfx: NONREACTIVE

## 2019-11-22 LAB — RPR: RPR Ser Ql: NONREACTIVE

## 2019-11-23 ENCOUNTER — Telehealth: Payer: Self-pay | Admitting: Obstetrics and Gynecology

## 2019-11-23 LAB — CERVICOVAGINAL ANCILLARY ONLY
Bacterial Vaginitis (gardnerella): POSITIVE — AB
Candida Glabrata: NEGATIVE
Candida Vaginitis: NEGATIVE
Chlamydia: NEGATIVE
Comment: NEGATIVE
Comment: NEGATIVE
Comment: NEGATIVE
Comment: NEGATIVE
Comment: NEGATIVE
Comment: NORMAL
Neisseria Gonorrhea: NEGATIVE
Trichomonas: POSITIVE — AB

## 2019-11-23 NOTE — Telephone Encounter (Signed)
Calling about results. Patient is requesting a call back today.

## 2019-11-24 ENCOUNTER — Telehealth: Payer: Self-pay | Admitting: Family Medicine

## 2019-11-24 ENCOUNTER — Other Ambulatory Visit: Payer: Self-pay | Admitting: Medical

## 2019-11-24 DIAGNOSIS — A599 Trichomoniasis, unspecified: Secondary | ICD-10-CM

## 2019-11-24 MED ORDER — TINIDAZOLE 500 MG PO TABS: 2.0000 g | ORAL_TABLET | Freq: Once | ORAL | 0 refills | Status: AC

## 2019-11-24 NOTE — Telephone Encounter (Signed)
Patient called into the office and stated that she seen her test results in her mychart and wanted to know what was the plan for her treatment. Patient instructed that the nurses will be calling her with these results and the treatment plan. Patient stated that it is Friday and she wants to know ASAP because she does not want to wait forever she has been going through this for 3 months now. Patient instructed that a message will be sent to the nurse and they will contact her as soon as they can. Patient stated "great so in like 3-4 days" and hung up the phone.

## 2019-11-24 NOTE — Progress Notes (Signed)
Called patient & informed her of new prescription. Patient voices concern over treatment as she has been treated multiple times now and hasn't been sexually active. Discussed this is a different medication than what she has had in the past & should be effective. Patient asked what else she could do for different. Told patient nothing else needs to be done other than taking the medication and avoiding sexual intercourse. Patient asked about returning for a retest. Told patient someone from the front office will reach out to her with an appt. Patient verbalized understanding.

## 2019-12-11 ENCOUNTER — Other Ambulatory Visit: Payer: Self-pay

## 2019-12-11 ENCOUNTER — Ambulatory Visit (INDEPENDENT_AMBULATORY_CARE_PROVIDER_SITE_OTHER): Payer: Medicaid Other | Admitting: Student

## 2019-12-11 ENCOUNTER — Encounter: Payer: Self-pay | Admitting: Student

## 2019-12-11 VITALS — BP 89/65 | HR 68 | Wt 335.4 lb

## 2019-12-11 DIAGNOSIS — Z30017 Encounter for initial prescription of implantable subdermal contraceptive: Secondary | ICD-10-CM

## 2019-12-11 DIAGNOSIS — Z3202 Encounter for pregnancy test, result negative: Secondary | ICD-10-CM

## 2019-12-11 LAB — POCT PREGNANCY, URINE: Preg Test, Ur: NEGATIVE

## 2019-12-11 MED ORDER — ETONOGESTREL 68 MG ~~LOC~~ IMPL
68.0000 mg | DRUG_IMPLANT | Freq: Once | SUBCUTANEOUS | Status: AC
  Administered 2019-12-11: 68 mg via SUBCUTANEOUS

## 2019-12-11 NOTE — Progress Notes (Signed)
     GYNECOLOGY OFFICE PROCEDURE NOTE  Kaitlyn Luna is a 29 y.o. G0P0000 here for Nexplanon removal insertion.  Last pap smear was on April 2021 and was normal.  No other gynecologic concerns.  Nexplanon Removal and Reinsertion Patient identified, informed consent performed, consent signed.   Patient does understand that irregular bleeding is a very common side effect of this medication. She was advised to have backup contraception for one week after replacement of the implant. Pregnancy test in clinic today was negative.  Appropriate time out taken. Nexplanon site identified in left arm.  Area prepped in usual sterile fashon. One ml of 1% lidocaine was used to anesthetize the area at the distal end of the implant. A small stab incision was made right beside the implant on the distal portion. The Nexplanon rod was grasped using hemostats and removed without difficulty. There was minimal blood loss. There were no complications. Area was then injected with 3 ml of 1 % lidocaine. She was re-prepped with betadine, Nexplanon removed from packaging, Device confirmed in needle, then inserted full length of needle and withdrawn per handbook instructions. Nexplanon was able to palpated in the patient's arm; patient palpated the insert herself.  There was minimal blood loss. Patient insertion site covered with gauze and a pressure bandage to reduce any bruising. The patient tolerated the procedure well and was given post procedure instructions.  She was advised to have backup contraception for one week.      [name]  Obstetrician & Gynecologist, [facility] for [employer], [facility]

## 2019-12-12 ENCOUNTER — Telehealth (INDEPENDENT_AMBULATORY_CARE_PROVIDER_SITE_OTHER): Payer: Self-pay | Admitting: Student

## 2019-12-12 DIAGNOSIS — N920 Excessive and frequent menstruation with regular cycle: Secondary | ICD-10-CM

## 2019-12-12 MED ORDER — MEGESTROL ACETATE 40 MG PO TABS: 80.0000 mg | ORAL_TABLET | Freq: Two times a day (BID) | ORAL | 0 refills | Status: AC

## 2019-12-12 NOTE — Addendum Note (Signed)
Addended by: Kathee Delton on: 12/12/2019 09:37 AM   Modules accepted: Orders

## 2019-12-12 NOTE — Telephone Encounter (Signed)
Returned patient's phone call and she reports heavy bleeding soaking through a pad an hour since last night. She also reports bad menstrual cramps as well. Patient states she has a history of bad periods but hasn't had one like this before & thinks it may be related to the Nexplanon. Told patient I doubt it is related to the Nexplanon since she just got that yesterday. Discussed we may be able to send some medication to her pharmacy to slow down/stop the bleeding in the meantime. Phone was disconnected. I tried calling the patient several times after but phone would ring over to voicemail. Called patient back and left message on voicemail stating a medication was being sent to her pharmacy to stop the bleeding and she could also take ibuprofen in the meantime for cramping. Stated she should call us back if she has further questions. Megace 80mg  BID x 2 weeks sent to pharmacy per .

## 2019-12-12 NOTE — Telephone Encounter (Signed)
Patient called into the office stating that she needs to speak to a nurse because she received a new birth control yesterday and she has been bleeding heavily since to where she is soaking through a pad every 30 mins. Patient stated that she would like to speak to someone as soon as possible because she does not want to go to the emergency room for this. Patient instructed that a nurse will call her back as soon as they can as they are currently with patients. Patient verbalized understanding and message sent to clinical pool.

## 2019-12-22 ENCOUNTER — Other Ambulatory Visit (HOSPITAL_COMMUNITY)
Admission: RE | Admit: 2019-12-22 | Discharge: 2019-12-22 | Disposition: A | Discharge status: Home / Self Care | Payer: Medicaid Other | Source: Ambulatory Visit | Attending: Family Medicine | Admitting: Family Medicine

## 2019-12-22 ENCOUNTER — Other Ambulatory Visit: Payer: Self-pay

## 2019-12-22 ENCOUNTER — Ambulatory Visit (INDEPENDENT_AMBULATORY_CARE_PROVIDER_SITE_OTHER): Payer: Self-pay | Admitting: *Deleted

## 2019-12-22 ENCOUNTER — Encounter: Payer: Self-pay | Admitting: *Deleted

## 2019-12-22 VITALS — BP 115/70 | HR 66 | Ht 67.0 in | Wt 332.3 lb

## 2019-12-22 DIAGNOSIS — A599 Trichomoniasis, unspecified: Secondary | ICD-10-CM | POA: Insufficient documentation

## 2019-12-22 DIAGNOSIS — N760 Acute vaginitis: Secondary | ICD-10-CM | POA: Insufficient documentation

## 2019-12-22 DIAGNOSIS — Z8619 Personal history of other infectious and parasitic diseases: Secondary | ICD-10-CM

## 2019-12-22 DIAGNOSIS — B9689 Other specified bacterial agents as the cause of diseases classified elsewhere: Secondary | ICD-10-CM | POA: Diagnosis not present

## 2019-12-22 DIAGNOSIS — Z202 Contact with and (suspected) exposure to infections with a predominantly sexual mode of transmission: Secondary | ICD-10-CM | POA: Diagnosis not present

## 2019-12-22 NOTE — Progress Notes (Signed)
Pt presents for test of cure due to +Trichomonas on 7/13. She endorses taking complete prescription of Metronidazole. She stated that she has not had intercourse since receiving treatment. Self swab was obtained and sent to lab. Pt advised she will be notified of results and treatment if needed via MyChart. She also reports having itching and some discomfort like a pinching feeling @ Nexplanon site on Rt arm. Insertion site was assessed and found to be well healed and without redness or swelling. Nexplanon was palpated. Per consult w/Dr. Vergie Living, pt will be scheduled for f/u appt in office with provider to evaluate further.

## 2019-12-24 NOTE — Progress Notes (Signed)
Patient was assessed and managed by nursing staff during this encounter. I have reviewed the chart and agree with the documentation and plan. I have also made any necessary editorial changes.  Montclair Bing, MD 12/24/2019 3:15 PM

## 2019-12-25 LAB — CERVICOVAGINAL ANCILLARY ONLY
Bacterial Vaginitis (gardnerella): POSITIVE — AB
Candida Glabrata: NEGATIVE
Candida Vaginitis: NEGATIVE
Chlamydia: NEGATIVE
Comment: NEGATIVE
Comment: NEGATIVE
Comment: NEGATIVE
Comment: NEGATIVE
Comment: NEGATIVE
Comment: NORMAL
Neisseria Gonorrhea: NEGATIVE
Trichomonas: POSITIVE — AB

## 2019-12-26 ENCOUNTER — Encounter: Payer: Self-pay | Admitting: Obstetrics and Gynecology

## 2019-12-26 ENCOUNTER — Other Ambulatory Visit: Payer: Self-pay | Admitting: Obstetrics and Gynecology

## 2019-12-26 DIAGNOSIS — A599 Trichomoniasis, unspecified: Secondary | ICD-10-CM | POA: Insufficient documentation

## 2019-12-26 MED ORDER — METRONIDAZOLE 500 MG PO TABS
500.0000 mg | ORAL_TABLET | Freq: Two times a day (BID) | ORAL | 0 refills | Status: AC
Start: 2019-12-26 — End: 2020-01-02

## 2020-01-18 ENCOUNTER — Telehealth (INDEPENDENT_AMBULATORY_CARE_PROVIDER_SITE_OTHER): Payer: Self-pay | Admitting: General Practice

## 2020-01-18 ENCOUNTER — Ambulatory Visit (INDEPENDENT_AMBULATORY_CARE_PROVIDER_SITE_OTHER): Payer: Self-pay | Admitting: Nurse Practitioner

## 2020-01-18 ENCOUNTER — Encounter: Payer: Self-pay | Admitting: Obstetrics & Gynecology

## 2020-01-18 ENCOUNTER — Other Ambulatory Visit: Payer: Self-pay

## 2020-01-18 VITALS — BP 123/83 | HR 82 | Wt 337.8 lb

## 2020-01-18 DIAGNOSIS — A599 Trichomoniasis, unspecified: Secondary | ICD-10-CM

## 2020-01-18 MED ORDER — TINIDAZOLE 500 MG PO TABS: 1000.0000 mg | ORAL_TABLET | Freq: Three times a day (TID) | ORAL | 0 refills | Status: DC

## 2020-01-18 MED ORDER — METRONIDAZOLE 0.75 % VA GEL: 1.0000 | Freq: Every day | VAGINAL | 0 refills | Status: AC

## 2020-01-18 NOTE — Telephone Encounter (Signed)
Patient called into office and left message with front office that she went to the pharmacy and her medication was $400 which she cannot afford-requested a call back.   Called patient, no answer- left message to call us back. Per Nolene Bernheim, only other alternative would be just using the tindamax for one week and see if only week is effective treatment. Previous treatments of flagyl 2g and tindamax 2g have not been effective.

## 2020-01-18 NOTE — Patient Instructions (Signed)
[place] [employer]  Department of Social [place] [address], [place], [place] [address] [phone]   or  [place]human-services/social-services **SNAP/EBT/ Other nutritional benefits  [place] DHHS-Public Health-W[facility] [address], [place], [place] [address] [phone]  or  https://[place].gov[place]/human-services/health-department **WIC for  women who are pregnant and postpartum, infants and children up to 5 years old  [facility] [address], [place], [place] [address] [phone]   or   www.theblessedtable.org  **Food pantry  [facility] [address], [place], [place] [address] [phone]   or   https://[facility].godaddysites.com  **Emergency food and prepared meals  [facility] [employer] [facility] [address], [place], [place] [address] [phone]   or   www.cedargrovetop.us **Food pantry  [facility] [address], [place], [place] [address] [phone]   or   www.facebook.com/[facility]-116430931718202 **Food pantry  [facility] [address], [place], [place] [address] [phone] **Food pantry  [facility] [address], [place], [place] [address] [phone]   or   www.greensborourbanministry.org  **Food pantry and prepared meals  [facility] [address], [place], [place] [address] https://jfsgreensboro.[email or web address]/  **Food pantry  [employer] [employer] [facility] [address], [place], [place] [address] [phone]   or   www.lbcnow.org  **Food pantry  [employer] [facility] [address], [place], [place] [address] [phone]   or   http://www.onestepfurther.com **Food pantry, nutrition education, gardening activities  [employer] [employer] [facility] [address], [place], [place] [address] [phone] **Food pantry  [facility] [address], [place], [place] [address] [phone]   or   www.salvationarmyof[place].org **Food pantry  [facility] [place] [address], [place], [place] [address] [phone]   or   http://senior-resources[place].org **Meals on Wheels Program  [facility] [address], , [place] [address] [phone]   or   www.stmattchurch.com  **Food pantry  [facility] [facility] [address], , [place] [address] [phone]   or   [facility].org **Food pantry     

## 2020-01-18 NOTE — Progress Notes (Signed)
   GYNECOLOGY OFFICE VISIT NOTE   History:  29 y.o. G0P0000 here today for trichomonas that has not resolved despite 3 treatments given.  Is NOT having intercourse.  Is extremely frustrated by the large amount of discharge and offensive odor. Additionally she has noticed occasional sharp pains in her arm which she thinks is coming from the Nexplanon.  She denies any abnormal  bleeding, pelvic pain or other concerns.   Past Medical History:  Diagnosis Date  . Anemia   . Anxiety   . Asthma   . Bipolar depression (HCC)   . Chronic kidney disease    UTI AND FREQUENT KIDNEY INFECTIONS FREQUENTLY  . Depression   . Pneumonia 2017  . PTSD (post-traumatic stress disorder)   . Sleep apnea    not using c-pap    Past Surgical History:  Procedure Laterality Date  . BREAST SURGERY     REDUCTI0N  . CERVICAL CONIZATION W/BX N/A 05/27/2017   Procedure: CONIZATION CERVIX WITH BIOPSY - COLD KNIFE;  Surgeon: Allie Bossier, MD;  Location: [facility];  Service: Gynecology;  Laterality: N/A;  . KNEE ARTHROSCOPY Left   . TONSILLECTOMY      The following portions of the patient's history were reviewed and updated as appropriate: allergies, current medications, past family history, past medical history, past social history, past surgical history and problem list.     Review of Systems:  Pertinent items noted in HPI and remainder of comprehensive ROS otherwise negative.  Objective:  Physical Exam BP 123/83   Pulse 82   Wt (!) 337 lb 12.8 oz (153.2 kg)   BMI 52.91 kg/m  CONSTITUTIONAL: Well-developed, well-nourished female in mild distress - teary. HENT:  Normocephalic, atraumatic. External right and left ear normal.  EYES: Conjunctivae and EOM are normal. Pupils are equal, round.  No scleral icterus.  NECK: Normal range of motion, supple,  SKIN: Skin is warm and dry. No rash noted. Not diaphoretic. No erythema. No pallor. NEUROLOGIC: Alert and oriented to person, place, and time. Normal muscle tone  coordination. No cranial nerve deficit noted. PSYCHIATRIC: Normal mood and affect. Normal behavior. Normal judgment and thought content. CARDIOVASCULAR: Normal heart rate noted RESPIRATORY: Effort and breath sounds normal, no problems with respiration noted ABDOMEN: Soft, no distention noted.   PELVIC: Deferred MUSCULOSKELETAL: Normal range of motion. No edema noted.  Nexplanon palpated in left upper, inner arm.  Nontender with palpation.  No skin changes noted.  Labs and Imaging No results found.  Assessment & Plan:  Resistant trichomonas Reviewed UpToDate and found treatment used for resistant trich and reviewed with client.  Tindamax 1 gm PO TID x 14 days.  Additionally client had been told by provider in the office that vaginal cream might also be indicated and she really wanted to use both treatments so metrogel QHS x 14 days prescribed Made appointment to return for TOC.  Routine preventative health maintenance measures emphasized. Please refer to After Visit Summary for other counseling recommendations.   Return in about 4 weeks (around 02/15/2020) for recheck - rn visit with self swab.   Total face-to-face time with patient: 10 minutes.  Over 50% of encounter was spent on counseling and coordination of care.  Nolene Bernheim, RN, MSN, NP-BC Nurse Practitioner, [facility] for [employer], [facility] 01/18/2020 12:34 PM

## 2020-01-23 MED ORDER — TINIDAZOLE 500 MG PO TABS: 1000.0000 mg | ORAL_TABLET | Freq: Three times a day (TID) | ORAL | 0 refills | Status: AC

## 2020-01-23 MED ORDER — TINIDAZOLE 500 MG PO TABS: 1000.0000 mg | ORAL_TABLET | Freq: Three times a day (TID) | ORAL | 0 refills | Status: DC

## 2020-01-23 MED FILL — TINIDAZOLE 500 MG TAB: 500 | 14 days supply | Qty: 84 | Fill #0

## 2020-01-23 NOTE — Telephone Encounter (Signed)
Called pt to give [place], NP recommendation of trying 1 week of treatment. Pt states she will not be able to afford this medication. Lowest price available from area pharmacies is approx. $150 per [facility]. Called [facility] who agrees to fill prescription one time for $20 total. Notified pt medication will be ready tomorrow due to this amount needing a special order. Rx sent to new pharmacy electronically; verified receipt over telephone. Called [facility] to discontinue other orders.

## 2020-01-27 ENCOUNTER — Other Ambulatory Visit: Payer: Self-pay

## 2020-01-27 ENCOUNTER — Encounter (HOSPITAL_BASED_OUTPATIENT_CLINIC_OR_DEPARTMENT_OTHER): Payer: Self-pay | Admitting: Emergency Medicine

## 2020-01-27 ENCOUNTER — Emergency Department (HOSPITAL_BASED_OUTPATIENT_CLINIC_OR_DEPARTMENT_OTHER)
Admission: EM | Admit: 2020-01-27 | Discharge: 2020-01-27 | Disposition: A | Discharge status: Home / Self Care | Payer: Medicaid Other | Attending: Emergency Medicine | Admitting: Emergency Medicine

## 2020-01-27 DIAGNOSIS — R0981 Nasal congestion: Secondary | ICD-10-CM

## 2020-01-27 DIAGNOSIS — Z20822 Contact with and (suspected) exposure to covid-19: Secondary | ICD-10-CM | POA: Insufficient documentation

## 2020-01-27 DIAGNOSIS — J45909 Unspecified asthma, uncomplicated: Secondary | ICD-10-CM | POA: Insufficient documentation

## 2020-01-27 DIAGNOSIS — N189 Chronic kidney disease, unspecified: Secondary | ICD-10-CM | POA: Insufficient documentation

## 2020-01-27 DIAGNOSIS — R059 Cough, unspecified: Secondary | ICD-10-CM

## 2020-01-27 DIAGNOSIS — R0982 Postnasal drip: Secondary | ICD-10-CM | POA: Insufficient documentation

## 2020-01-27 DIAGNOSIS — Z79899 Other long term (current) drug therapy: Secondary | ICD-10-CM | POA: Insufficient documentation

## 2020-01-27 DIAGNOSIS — Z87891 Personal history of nicotine dependence: Secondary | ICD-10-CM | POA: Insufficient documentation

## 2020-01-27 DIAGNOSIS — J029 Acute pharyngitis, unspecified: Secondary | ICD-10-CM | POA: Insufficient documentation

## 2020-01-27 LAB — GROUP A STREP BY PCR: Group A Strep by PCR: NOT DETECTED

## 2020-01-27 LAB — SARS CORONAVIRUS 2 BY RT PCR (HOSPITAL ORDER, PERFORMED IN ~~LOC~~ HOSPITAL LAB): SARS Coronavirus 2: NEGATIVE

## 2020-01-27 MED ORDER — AFRIN NASAL SPRAY 0.05 % NA SOLN: 1.0000 | Freq: Two times a day (BID) | NASAL | 0 refills | Status: AC

## 2020-01-27 NOTE — ED Provider Notes (Signed)
[facility] EMERGENCY DEPARTMENT Provider Note   CSN: [id number] Arrival date & time: 01/27/20  1211     History Chief Complaint  Patient presents with  . Sore Throat  . Cough    Kaitlyn Luna is a 29 y.o. female presenting for evaluation of sore throat, cough, nasal congestion.  Patient states her symptoms began 3 days ago.  When she woke up this morning, her throat was hurting a lot and she had significant congestion.  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  She denies fevers, chills, chest pain, shortness of breath, nausea vomiting, abd pain.  She took Alka-Seltzer cold and flu medicine without significant change, has not tried anything else.  She reports a history of asthma, has not needed her inhaler.  She reports no other medical problems. She denies sick contacts.   HPI     Past Medical History:  Diagnosis Date  . Anemia   . Anxiety   . Asthma   . Bipolar depression (HCC)   . Chronic kidney disease    UTI AND FREQUENT KIDNEY INFECTIONS FREQUENTLY  . Depression   . Pneumonia 2017  . PTSD (post-traumatic stress disorder)   . Sleep apnea    not using c-pap    Patient Active Problem List   Diagnosis Date Noted  . Trichomoniasis 12/26/2019  . History of heavy periods 08/14/2019  . CIN II (cervical intraepithelial neoplasia II) 07/07/2017  . 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 Right ankle pain 04/19/2017  . Morbid obesity (HCC) 02/01/2017  . PTSD (post-traumatic stress disorder) 07/26/2013  . MDD (major depressive disorder), recurrent severe, 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    Past Surgical History:  Procedure Laterality Date  . BREAST SURGERY     REDUCTI0N  . CERVICAL CONIZATION W/BX N/A 05/27/2017   Procedure: CONIZATION CERVIX WITH BIOPSY - COLD KNIFE;  Surgeon: Allie Bossier, MD;  Location: [facility];  Service: Gynecology;  Laterality: N/A;  . KNEE ARTHROSCOPY Left   . TONSILLECTOMY       OB History    Gravida  0   Para  0    Term  0   Preterm  0   AB  0   Living  0     SAB  0   TAB  0   Ectopic  0   Multiple  0   Live Births              Family History  Problem Relation Age of Onset  . Diabetes Father     Social History   Tobacco Use  . Smoking status: Former Smoker    Types: Cigarettes    Quit date: 12/07/2015    Years since quitting: 4.1  . Smokeless tobacco: Never Used  Vaping Use  . Vaping Use: Never used  Substance Use Topics  . Alcohol use: Yes    Comment: occasional beer  . Drug use: No    Home Medications Prior to Admission medications   Medication Sig Start Date End Date Taking? Authorizing Provider  albuterol (VENTOLIN HFA) 108 (90 Base) MCG/ACT inhaler Inhale 1-2 puffs into the lungs every 6 (six) hours as needed for wheezing or shortness of breath. 01/05/19   Lamptey, Britta Mccreedy, MD  gabapentin (NEURONTIN) 300 MG capsule Take 300 mg by mouth at bedtime. 09/30/19   [provider]  ibuprofen (ADVIL,MOTRIN) 600 MG tablet Take 1 tablet (600 mg total) by mouth every 6 (six) hours as needed. 05/27/17  Nicholaus Bloom C, MD  Melatonin 5 MG TABS Take 5 mg by mouth at bedtime.    [provider]  metroNIDAZOLE (METROGEL VAGINAL) 0.75 % vaginal gel Place 1 Applicatorful vaginally at bedtime for 14 days. 01/18/20 02/01/20  Currie Paris, NP  mineral oil-hydrophilic petrolatum (AQUAPHOR) ointment Apply topically as needed for dry skin. 09/20/18   Horton, Mayer Masker, MD  oxymetazoline (AFRIN NASAL SPRAY) 0.05 % nasal spray Place 1 spray into both nostrils 2 (two) times daily for 3 days. 01/27/20 01/30/20  Jaxan Michel, PA-C  tinidazole (TINDAMAX) 500 MG tablet Take 2 tablets (1,000 mg total) by mouth 3 (three) times daily for 14 days. 01/23/20 02/06/20  Currie Paris, NP    Allergies    Lavender oil  Review of Systems   Review of Systems  HENT: Positive for congestion and sore throat.   Respiratory: Positive for cough.     Physical Exam Updated Vital  Signs BP (!) 117/97 (BP Location: Left Arm)   Pulse 89   Temp 98.9 F (37.2 C) (Oral)   Resp 18   Ht 5\' 6"  (1.676 m)   Wt (!) 145.2 kg   SpO2 98%   BMI 51.65 kg/m   Physical Exam Vitals and nursing note reviewed.  Constitutional:      General: She is not in acute distress.    Appearance: She is well-developed. She is obese.     Comments: Resting in the bed in no acute distress  HENT:     Head: Normocephalic and atraumatic.     Right Ear: Tympanic membrane, ear canal and external ear normal.     Left Ear: Tympanic membrane, ear canal and external ear normal.     Nose: Mucosal edema, congestion and rhinorrhea present. Rhinorrhea is clear.     Right Sinus: Maxillary sinus tenderness present.     Left Sinus: Maxillary sinus tenderness present.     Comments: nasal mucosal edema.  Clear rhinorrhea.  Mild tenderness palpation of maxillary sinuses.    Mouth/Throat:     Comments: OP clear without tonsillar swelling exudate.  Uvula midline with equal palate rise.  No trismus.  Handling secretions easily. Eyes:     Extraocular Movements: Extraocular movements intact.     Conjunctiva/sclera: Conjunctivae normal.     Pupils: Pupils are equal, round, and reactive to light.  Cardiovascular:     Rate and Rhythm: Normal rate and regular rhythm.     Pulses: Normal pulses.  Pulmonary:     Effort: Pulmonary effort is normal. No respiratory distress.     Breath sounds: Normal breath sounds. No wheezing.  Abdominal:     General: There is no distension.     Palpations: Abdomen is soft. There is no mass.     Tenderness: There is no abdominal tenderness. There is no guarding or rebound.  Musculoskeletal:        General: Normal range of motion.     Cervical back: Normal range of motion and neck supple.  Skin:    General: Skin is warm and dry.     Capillary Refill: Capillary refill takes less than 2 seconds.  Neurological:     Mental Status: She is alert and oriented to person, place, and time.      ED Results / Procedures / Treatments   Labs (all labs ordered are listed, but only abnormal results are displayed) Labs Reviewed  GROUP A STREP BY PCR  SARS CORONAVIRUS 2 BY RT PCR [facility] ORDER,  PERFORMED IN [facility] LAB)    EKG None  Radiology No results found.  Procedures Procedures (including critical care time)  Medications Ordered in ED Medications - No data to display  ED Course  I have reviewed the triage vital signs and the nursing notes.  Pertinent labs & imaging results that were available during my care of the patient were reviewed by me and considered in my medical decision making (see chart for details).    MDM Rules/Calculators/A&P                          Patient presenting with 2 day h/o URI symptoms.  Physical exam reassuring, patient is afebrile and appears nontoxic.  Pulmonary exam reassuring.  Doubt pneumonia, strep, other bacterial infection, or peritonsillar abscess. Strep and covid testing negative.  Likely viral URI.  Will treat symptomatically.  Patient to follow-up with primary care as needed.  At this time, patient appears safe for discharge.  Return precautions given.  Patient states she understands and agrees to plan.  Final Clinical Impression(s) / ED Diagnoses Final diagnoses:  Sore throat  Cough  Nasal congestion    Rx / DC Orders ED Discharge Orders         Ordered    oxymetazoline (AFRIN NASAL SPRAY) 0.05 % nasal spray  2 times daily        01/27/20 1407           Antasia Haider, PA-C 01/27/20 1410    Arby Barrette, MD 01/28/20 1046

## 2020-01-27 NOTE — ED Triage Notes (Signed)
Sore throat, cough x 2 days.

## 2020-01-27 NOTE — Discharge Instructions (Signed)
This is likely a viral illness, treat symptomatically. Use Flonase daily. You may also use Afrin to help with more immediate relief, although do not use this more than 3 days in a row. Use Tylenol or ibuprofen as needed for pain or headaches. Make sure stay well-hydrated water. Return to the emergency room if you develop difficulty breathing or any new, worsening, or concerning symptoms.

## 2020-02-15 ENCOUNTER — Ambulatory Visit: Payer: Medicaid Other

## 2020-02-16 ENCOUNTER — Telehealth: Payer: Self-pay | Admitting: Family Medicine

## 2020-02-16 NOTE — Telephone Encounter (Signed)
Returned patients call in regards to questions about medications. Patient did not answer. LM for patient to call office at her convenience or send a My Chart message with questions or concerns.

## 2020-02-16 NOTE — Telephone Encounter (Signed)
Patient want to speak to someone to discuss her medications

## 2020-02-24 ENCOUNTER — Ambulatory Visit: Payer: Medicaid Other

## 2020-03-01 ENCOUNTER — Ambulatory Visit: Payer: Medicaid Other

## 2020-10-11 ENCOUNTER — Encounter (HOSPITAL_BASED_OUTPATIENT_CLINIC_OR_DEPARTMENT_OTHER): Payer: Self-pay

## 2020-10-11 ENCOUNTER — Other Ambulatory Visit: Payer: Self-pay

## 2020-10-11 ENCOUNTER — Emergency Department (HOSPITAL_BASED_OUTPATIENT_CLINIC_OR_DEPARTMENT_OTHER)
Admission: EM | Admit: 2020-10-11 | Discharge: 2020-10-12 | Disposition: A | Discharge status: Home / Self Care | Payer: Medicaid Other | Attending: Emergency Medicine | Admitting: Emergency Medicine

## 2020-10-11 DIAGNOSIS — N390 Urinary tract infection, site not specified: Secondary | ICD-10-CM | POA: Insufficient documentation

## 2020-10-11 DIAGNOSIS — Z20822 Contact with and (suspected) exposure to covid-19: Secondary | ICD-10-CM | POA: Insufficient documentation

## 2020-10-11 DIAGNOSIS — R1013 Epigastric pain: Secondary | ICD-10-CM | POA: Insufficient documentation

## 2020-10-11 DIAGNOSIS — J45909 Unspecified asthma, uncomplicated: Secondary | ICD-10-CM | POA: Insufficient documentation

## 2020-10-11 DIAGNOSIS — R112 Nausea with vomiting, unspecified: Secondary | ICD-10-CM | POA: Insufficient documentation

## 2020-10-11 DIAGNOSIS — Z87891 Personal history of nicotine dependence: Secondary | ICD-10-CM | POA: Insufficient documentation

## 2020-10-11 DIAGNOSIS — R197 Diarrhea, unspecified: Secondary | ICD-10-CM | POA: Insufficient documentation

## 2020-10-11 DIAGNOSIS — B9689 Other specified bacterial agents as the cause of diseases classified elsewhere: Secondary | ICD-10-CM | POA: Insufficient documentation

## 2020-10-11 DIAGNOSIS — R1010 Upper abdominal pain, unspecified: Secondary | ICD-10-CM | POA: Insufficient documentation

## 2020-10-11 LAB — CBC WITH DIFFERENTIAL/PLATELET
Abs Immature Granulocytes: 0.05 10*3/uL (ref 0.00–0.07)
Basophils Absolute: 0.1 10*3/uL (ref 0.0–0.1)
Basophils Relative: 1 %
Eosinophils Absolute: 0.1 10*3/uL (ref 0.0–0.5)
Eosinophils Relative: 1 %
HCT: 38.5 % (ref 36.0–46.0)
Hemoglobin: 13 g/dL (ref 12.0–15.0)
Immature Granulocytes: 1 %
Lymphocytes Relative: 17 %
Lymphs Abs: 1.8 10*3/uL (ref 0.7–4.0)
MCH: 29.9 pg (ref 26.0–34.0)
MCHC: 33.8 g/dL (ref 30.0–36.0)
MCV: 88.5 fL (ref 80.0–100.0)
Monocytes Absolute: 0.7 10*3/uL (ref 0.1–1.0)
Monocytes Relative: 6 %
Neutro Abs: 8.2 10*3/uL — ABNORMAL HIGH (ref 1.7–7.7)
Neutrophils Relative %: 74 %
Platelets: 258 10*3/uL (ref 150–400)
RBC: 4.35 MIL/uL (ref 3.87–5.11)
RDW: 12.5 % (ref 11.5–15.5)
WBC: 10.9 10*3/uL — ABNORMAL HIGH (ref 4.0–10.5)
nRBC: 0 % (ref 0.0–0.2)

## 2020-10-11 LAB — COMPREHENSIVE METABOLIC PANEL
ALT: 18 U/L (ref 0–44)
AST: 19 U/L (ref 15–41)
Albumin: 3.5 g/dL (ref 3.5–5.0)
Alkaline Phosphatase: 55 U/L (ref 38–126)
Anion gap: 9 (ref 5–15)
BUN: 12 mg/dL (ref 6–20)
CO2: 21 mmol/L — ABNORMAL LOW (ref 22–32)
Calcium: 8.2 mg/dL — ABNORMAL LOW (ref 8.9–10.3)
Chloride: 104 mmol/L (ref 98–111)
Creatinine, Ser: 0.76 mg/dL (ref 0.44–1.00)
GFR, Estimated: >60 mL/min (ref >60)
Glucose, Bld: 104 mg/dL — ABNORMAL HIGH (ref 70–99)
Potassium: 3.1 mmol/L — ABNORMAL LOW (ref 3.5–5.1)
Sodium: 134 mmol/L — ABNORMAL LOW (ref 135–145)
Total Bilirubin: 0.5 mg/dL (ref 0.3–1.2)
Total Protein: 6.6 g/dL (ref 6.5–8.1)

## 2020-10-11 LAB — URINALYSIS, ROUTINE W REFLEX MICROSCOPIC
Glucose, UA: NEGATIVE mg/dL
Hgb urine dipstick: NEGATIVE
Ketones, ur: 15 mg/dL — AB
Leukocytes,Ua: NEGATIVE
Nitrite: NEGATIVE
Protein, ur: 30 mg/dL — AB
Specific Gravity, Urine: >1.03 — ABNORMAL HIGH (ref 1.005–1.030)
pH: 6 (ref 5.0–8.0)

## 2020-10-11 LAB — WET PREP, GENITAL
Clue Cells Wet Prep HPF POC: NONE SEEN
Sperm: NONE SEEN
Trich, Wet Prep: NONE SEEN
Yeast Wet Prep HPF POC: NONE SEEN

## 2020-10-11 LAB — RESP PANEL BY RT-PCR (FLU A&B, COVID) ARPGX2
Influenza A by PCR: NEGATIVE
Influenza B by PCR: NEGATIVE
SARS Coronavirus 2 by RT PCR: NEGATIVE

## 2020-10-11 LAB — PREGNANCY, URINE: Preg Test, Ur: NEGATIVE

## 2020-10-11 LAB — URINALYSIS, MICROSCOPIC (REFLEX)

## 2020-10-11 LAB — LIPASE, BLOOD: Lipase: 26 U/L (ref 11–51)

## 2020-10-11 LAB — LACTIC ACID, PLASMA: Lactic Acid, Venous: 0.8 mmol/L (ref 0.5–1.9)

## 2020-10-11 MED ORDER — ALUM & MAG HYDROXIDE-SIMETH 200-200-20 MG/5ML PO SUSP
30.0000 mL | Freq: Once | ORAL | Status: AC
  Administered 2020-10-11: 30 mL via ORAL
  Filled 2020-10-11: qty 30

## 2020-10-11 MED ORDER — ONDANSETRON HCL 4 MG/2ML IJ SOLN
4.0000 mg | Freq: Once | INTRAMUSCULAR | Status: AC
  Administered 2020-10-11: 4 mg via INTRAVENOUS
  Filled 2020-10-11: qty 2

## 2020-10-11 MED ORDER — SODIUM CHLORIDE 0.9 % IV BOLUS
1000.0000 mL | Freq: Once | INTRAVENOUS | Status: AC
  Administered 2020-10-12: 1000 mL via INTRAVENOUS

## 2020-10-11 NOTE — ED Provider Notes (Signed)
[facility] EMERGENCY DEPARTMENT Provider Note   CSN: [id number] Arrival date & time: 10/11/20  2036     History Chief Complaint  Patient presents with  . Dysuria    Kaitlyn Luna is a 30 y.o. female with a past medical history of PTSD, bipolar depression, sleep apnea, elevated BMI, who presents today for evaluation of multiple complaints.  Her primary concern is that she has dysuria, increased frequency and urgency.  She reports that she has been nauseous.  She reports that over the past 2 days she has also noticed a foul-smelling discharge. She states she has not been sexually active in about 3 months. She denies any known sick contacts however notes that she was recently on an airplane. She reports pain in her lower back. She states that usually her blood pressure is about 100/60. She reports times at home of "100.something."  She reports upper abdominal pain, nausea.  She feels like she has something sitting at the top of her stomach. She denies any chest pain or shortness of breath.  She states that she has had about 3 episodes of diarrhea and 3 episodes of vomiting both yesterday and today.  HPI     Past Medical History:  Diagnosis Date  . Anemia   . Anxiety   . Asthma   . Bipolar depression (HCC)   . Chronic kidney disease    UTI AND FREQUENT KIDNEY INFECTIONS FREQUENTLY  . Depression   . Pneumonia 2017  . PTSD (post-traumatic stress disorder)   . Sleep apnea    not using c-pap    Patient Active Problem List   Diagnosis Date Noted  . Trichomoniasis 12/26/2019  . History of heavy periods 08/14/2019  . CIN II (cervical intraepithelial neoplasia II) 07/07/2017  . 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 Right ankle pain 04/19/2017  . Morbid obesity (HCC) 02/01/2017  . PTSD (post-traumatic stress disorder) 07/26/2013  . MDD (major depressive disorder), recurrent severe, 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    Past Surgical History:  Procedure Laterality  Date  . BREAST SURGERY     REDUCTI0N  . CERVICAL CONIZATION W/BX N/A 05/27/2017   Procedure: CONIZATION CERVIX WITH BIOPSY - COLD KNIFE;  Surgeon: Allie Bossier, MD;  Location: [facility];  Service: Gynecology;  Laterality: N/A;  . KNEE ARTHROSCOPY Left   . TONSILLECTOMY       OB History    Gravida  0   Para  0   Term  0   Preterm  0   AB  0   Living  0     SAB  0   IAB  0   Ectopic  0   Multiple  0   Live Births              Family History  Problem Relation Age of Onset  . Diabetes Father     Social History   Tobacco Use  . Smoking status: Former Smoker    Types: Cigarettes    Quit date: 12/07/2015    Years since quitting: 4.8  . Smokeless tobacco: Never Used  Vaping Use  . Vaping Use: Never used  Substance Use Topics  . Alcohol use: Yes    Comment: occ  . Drug use: No    Home Medications Prior to Admission medications   Medication Sig Start Date End Date Taking? Authorizing Provider  cephALEXin (KEFLEX) 500 MG capsule Take 1 capsule (500 mg total) by mouth 4 (four) times daily for 7 days.  10/12/20 10/19/20 Yes Cristina GongHammond, Eytan Carrigan W, PA-C  ondansetron (ZOFRAN ODT) 4 MG disintegrating tablet Take 1 tablet (4 mg total) by mouth every 8 (eight) hours as needed for nausea or vomiting. 10/12/20  Yes Cristina GongHammond, Ramie Palladino W, PA-C  albuterol (VENTOLIN HFA) 108 (90 Base) MCG/ACT inhaler Inhale 1-2 puffs into the lungs every 6 (six) hours as needed for wheezing or shortness of breath. 01/05/19   Lamptey, Britta MccreedyPhilip O, MD  gabapentin (NEURONTIN) 300 MG capsule Take 300 mg by mouth at bedtime. 09/30/19   [provider]  ibuprofen (ADVIL,MOTRIN) 600 MG tablet Take 1 tablet (600 mg total) by mouth every 6 (six) hours as needed. 05/27/17   Allie Bossierove, Myra C, MD  Melatonin 5 MG TABS Take 5 mg by mouth at bedtime.    [provider]  mineral oil-hydrophilic petrolatum (AQUAPHOR) ointment Apply topically as needed for dry skin. 09/20/18   Horton, Mayer Maskerourtney F, MD     Allergies    Lavender oil  Review of Systems   Review of Systems  Constitutional: Positive for fever. Negative for chills.  Eyes: Negative for visual disturbance.  Respiratory: Positive for cough (She attributes this to "sour stomach"). Negative for chest tightness and shortness of breath.   Gastrointestinal: Positive for nausea. Negative for constipation.  Genitourinary: Positive for dysuria, frequency, urgency and vaginal discharge. Negative for flank pain.  Musculoskeletal: Positive for back pain.  Skin: Negative for color change and rash.  Neurological: Negative for weakness and headaches.    Physical Exam Updated Vital Signs BP 99/61   Pulse 91   Temp 99.8 F (37.7 C) (Oral)   Resp 18   SpO2 97%   Physical Exam Vitals and nursing note reviewed.  Constitutional:      General: She is not in acute distress.    Appearance: She is not diaphoretic.  HENT:     Head: Normocephalic and atraumatic.  Eyes:     General: No scleral icterus.       Right eye: No discharge.        Left eye: No discharge.     Conjunctiva/sclera: Conjunctivae normal.  Cardiovascular:     Rate and Rhythm: Normal rate and regular rhythm.  Pulmonary:     Effort: Pulmonary effort is normal. No respiratory distress.     Breath sounds: No stridor.  Abdominal:     General: There is no distension.     Tenderness: There is abdominal tenderness (Mild, epigastric). There is no guarding.  Genitourinary:    Comments: Normal external female genitalia.  No adnexal tenderness or fullness, no discrete cervical motion tenderness.  No abnormal discharge visualized in the vaginal canal. Musculoskeletal:        General: No deformity.     Cervical back: Normal range of motion.  Skin:    General: Skin is warm and dry.  Neurological:     Mental Status: She is alert.     Motor: No abnormal muscle tone.     Comments: Patient is awake and alert, answers questions appropriately.  Speech is not slurred.   Psychiatric:        Mood and Affect: Mood normal.        Behavior: Behavior normal.     ED Results / Procedures / Treatments   Labs (all labs ordered are listed, but only abnormal results are displayed) Labs Reviewed  WET PREP, GENITAL - Abnormal; Notable for the following components:      Result Value   WBC, Wet Prep HPF  POC FEW (*)    All other components within normal limits  URINALYSIS, ROUTINE W REFLEX MICROSCOPIC - Abnormal; Notable for the following components:   APPearance HAZY (*)    Specific Gravity, Urine >1.030 (*)    Bilirubin Urine SMALL (*)    Ketones, ur 15 (*)    Protein, ur 30 (*)    All other components within normal limits  URINALYSIS, MICROSCOPIC (REFLEX) - Abnormal; Notable for the following components:   Bacteria, UA MANY (*)    All other components within normal limits  COMPREHENSIVE METABOLIC PANEL - Abnormal; Notable for the following components:   Sodium 134 (*)    Potassium 3.1 (*)    CO2 21 (*)    Glucose, Bld 104 (*)    Calcium 8.2 (*)    All other components within normal limits  CBC WITH DIFFERENTIAL/PLATELET - Abnormal; Notable for the following components:   WBC 10.9 (*)    Neutro Abs 8.2 (*)    All other components within normal limits  RESP PANEL BY RT-PCR (FLU A&B, COVID) ARPGX2  URINE CULTURE  PREGNANCY, URINE  LIPASE, BLOOD  LACTIC ACID, PLASMA  LACTIC ACID, PLASMA  GC/CHLAMYDIA PROBE AMP ([place]) NOT AT [facility]    EKG EKG Interpretation  Date/Time:  Friday October 11 2020 22:32:42 EDT Ventricular Rate:  85 PR Interval:  159 QRS Duration: 94 QT Interval:  376 QTC Calculation: 448 R Axis:   57 Text Interpretation: Sinus rhythm Borderline Q waves in lateral leads Borderline T abnormalities, anterior leads Confirmed by Virgina Norfolk ([id number]) on 10/11/2020 10:34:54 PM   Radiology No results found.  Procedures Procedures   Medications Ordered in ED Medications  sodium chloride 0.9 % bolus 1,000 mL (has no administration  in time range)  potassium chloride SA (KLOR-CON) CR tablet 40 mEq (has no administration in time range)  ondansetron (ZOFRAN) injection 4 mg (4 mg Intravenous Given 10/11/20 2317)  alum & mag hydroxide-simeth (MAALOX/MYLANTA) 200-200-20 MG/5ML suspension 30 mL (30 mLs Oral Given 10/11/20 2312)    ED Course  I have reviewed the triage vital signs and the nursing notes.  Pertinent labs & imaging results that were available during my care of the patient were reviewed by me and considered in my medical decision making (see chart for details).    MDM Rules/Calculators/A&P                         Patient is a 30 year old woman who presents today for evaluation of vomiting, diarrhea, dysuria, frequency and urgency.  Here she is afebrile, not tachycardic or tachypneic.  She does have a slight leukocytosis at 10.9.  CMP shows mild hypokalemia with a potassium of 3.1, I suspect that this is secondary to GI loss EKG without evidence of acute ischemia. Given that she reports foul-smelling discharge wet prep was obtained shows few white cells.  She does not have discrete cervical motion tenderness or adnexal tenderness.  UA is consistent with infection, does show 11-20 epithelial cells however she is having dysuria, frequency, urgency with many bacteria on her UA.  Plan to treat with antibiotics.  Urine culture is sent.  At this time pending IV fluids, p.o. challenge, and reevaluation.  Her Bps have been in the 90s/60s but patient reports this is her normal.    At shift change care was transferred to Dr. Judd Lien who will follow pending studies, re-evaulate and determine disposition.    Note: Portions of this report  may have been transcribed using voice recognition software. Every effort was made to ensure accuracy; however, inadvertent computerized transcription errors may be present  Final Clinical Impression(s) / ED Diagnoses Final diagnoses:  Lower urinary tract infectious disease  Nausea vomiting and  diarrhea    Rx / DC Orders ED Discharge Orders         Ordered    ondansetron (ZOFRAN ODT) 4 MG disintegrating tablet  Every 8 hours PRN        10/12/20 0032    cephALEXin (KEFLEX) 500 MG capsule  4 times daily        10/12/20 0032           Cristina Gong, PA-C 10/12/20 Kaitlyn Luna    Geoffery Lyons, MD 10/12/20 [phone]

## 2020-10-11 NOTE — ED Triage Notes (Signed)
Pt c/o dysuria, back pain, n/v, fever started yesterday-NAD-steady gait

## 2020-10-12 MED ORDER — ONDANSETRON 4 MG PO TBDP: 4.0000 mg | ORAL_TABLET | Freq: Three times a day (TID) | ORAL | 0 refills | Status: DC | PRN

## 2020-10-12 MED ORDER — CEPHALEXIN 500 MG PO CAPS: 500.0000 mg | ORAL_CAPSULE | Freq: Four times a day (QID) | ORAL | 0 refills | Status: AC

## 2020-10-12 MED ORDER — POTASSIUM CHLORIDE CRYS ER 20 MEQ PO TBCR
40.0000 meq | EXTENDED_RELEASE_TABLET | Freq: Once | ORAL | Status: AC
  Administered 2020-10-12: 40 meq via ORAL
  Filled 2020-10-12: qty 2

## 2020-10-12 NOTE — Discharge Instructions (Signed)
Please eat a bland diet. Please make sure you are staying well hydrated.   Please consider discussing evaluation for PCOS with your OB/GYN.   You may have diarrhea from the antibiotics.  It is very important that you continue to take the antibiotics even if you get diarrhea unless a medical professional tells you that you may stop taking them.  If you stop too early the bacteria you are being treated for will become stronger and you may need different, more powerful antibiotics that have more side effects and worsening diarrhea.  Please stay well hydrated and consider probiotics as they may decrease the severity of your diarrhea.  Please be aware that if you take any hormonal contraception (birth control pills, nexplanon, the ring, etc) that your birth control will not work while you are taking antibiotics and you need to use back up protection as directed on the birth control medication information insert.

## 2020-10-13 LAB — URINE CULTURE

## 2020-10-14 LAB — GC/CHLAMYDIA PROBE AMP (~~LOC~~) NOT AT ARMC
Chlamydia: NEGATIVE
Comment: NEGATIVE
Comment: NORMAL
Neisseria Gonorrhea: NEGATIVE

## 2020-12-05 ENCOUNTER — Ambulatory Visit: Payer: Medicaid Other | Admitting: Obstetrics & Gynecology

## 2021-05-28 ENCOUNTER — Other Ambulatory Visit (HOSPITAL_COMMUNITY)
Admission: RE | Admit: 2021-05-28 | Discharge: 2021-05-28 | Disposition: A | Discharge status: Home / Self Care | Payer: BC Managed Care – PPO | Source: Ambulatory Visit | Attending: Obstetrics | Admitting: Obstetrics

## 2021-05-28 ENCOUNTER — Other Ambulatory Visit: Payer: Self-pay

## 2021-05-28 ENCOUNTER — Ambulatory Visit (INDEPENDENT_AMBULATORY_CARE_PROVIDER_SITE_OTHER): Payer: BC Managed Care – PPO | Admitting: Obstetrics

## 2021-05-28 ENCOUNTER — Encounter: Payer: Self-pay | Admitting: Obstetrics

## 2021-05-28 VITALS — BP 125/84 | HR 91 | Wt 350.0 lb

## 2021-05-28 DIAGNOSIS — Z01419 Encounter for gynecological examination (general) (routine) without abnormal findings: Secondary | ICD-10-CM

## 2021-05-28 DIAGNOSIS — Z3046 Encounter for surveillance of implantable subdermal contraceptive: Secondary | ICD-10-CM

## 2021-05-28 DIAGNOSIS — N898 Other specified noninflammatory disorders of vagina: Secondary | ICD-10-CM | POA: Insufficient documentation

## 2021-05-28 DIAGNOSIS — Z01411 Encounter for gynecological examination (general) (routine) with abnormal findings: Secondary | ICD-10-CM | POA: Diagnosis not present

## 2021-05-28 DIAGNOSIS — L680 Hirsutism: Secondary | ICD-10-CM

## 2021-05-28 DIAGNOSIS — N926 Irregular menstruation, unspecified: Secondary | ICD-10-CM | POA: Diagnosis not present

## 2021-05-28 DIAGNOSIS — R232 Flushing: Secondary | ICD-10-CM

## 2021-05-28 DIAGNOSIS — Z131 Encounter for screening for diabetes mellitus: Secondary | ICD-10-CM

## 2021-05-28 NOTE — Progress Notes (Signed)
Pt states she has had some recent changes, ?hormonal related. Pt states she has been having hot flashes, sweats, not currently sexually active.  Pt states she is also having facial hair growth, weight gain.  Pt has Nexplanon in place, placed 12/2019.

## 2021-05-28 NOTE — Progress Notes (Signed)
GYNECOLOGY ANNUAL PREVENTATIVE CARE ENCOUNTER NOTE  History:     Kaitlyn Luna is a 31 y.o. G0P0000 female here for a routine annual gynecologic exam.  Current complaints: Abnormal hair growth on face and chest.   Has Nexplanon for contraception and has irregular cycles.  Also c/o vaginal discharge.  Denies pelvic pain, problems with intercourse or other gynecologic concerns.    Gynecologic History No LMP recorded (approximate). Patient has had an implant. Contraception: Nexplanon Last Pap: 2021. Result was normal with negative HPV Last Mammogram: n/a.  Result was n/a Last Colonoscopy: n/a.  Result was n/a  Obstetric History OB History  Gravida Para Term Preterm AB Living  0 0 0 0 0 0  SAB IAB Ectopic Multiple Live Births  0 0 0 0      Past Medical History:  Diagnosis Date   Anemia    Anxiety    Asthma    Bipolar depression (HCC)    Chronic kidney disease    UTI AND FREQUENT KIDNEY INFECTIONS FREQUENTLY   Depression    Pneumonia 2017   PTSD (post-traumatic stress disorder)    Sleep apnea    not using c-pap    Past Surgical History:  Procedure Laterality Date   BREAST SURGERY     REDUCTI0N   CERVICAL CONIZATION W/BX N/A 05/27/2017   Procedure: CONIZATION CERVIX WITH BIOPSY - COLD KNIFE;  Surgeon: Allie Bossier, MD;  Location: [facility];  Service: Gynecology;  Laterality: N/A;   KNEE ARTHROSCOPY Left    TONSILLECTOMY      Current Outpatient Medications on File Prior to Visit  Medication Sig Dispense Refill   ibuprofen (ADVIL,MOTRIN) 600 MG tablet Take 1 tablet (600 mg total) by mouth every 6 (six) hours as needed. 30 tablet 1   albuterol (VENTOLIN HFA) 108 (90 Base) MCG/ACT inhaler Inhale 1-2 puffs into the lungs every 6 (six) hours as needed for wheezing or shortness of breath. 6.7 g 0   gabapentin (NEURONTIN) 300 MG capsule Take 300 mg by mouth at bedtime.     Melatonin 5 MG TABS Take 5 mg by mouth at bedtime.     mineral oil-hydrophilic petrolatum (AQUAPHOR)  ointment Apply topically as needed for dry skin. 420 g 0   No current facility-administered medications on file prior to visit.    Allergies  Allergen Reactions   Lavender Oil Hives    Social History:  reports that she quit smoking about 5 years ago. Her smoking use included cigarettes. She has never used smokeless tobacco. She reports current alcohol use. She reports that she does not use drugs.  Family History  Problem Relation Age of Onset   Diabetes Father     The following portions of the patient's history were reviewed and updated as appropriate: allergies, current medications, past family history, past medical history, past social history, past surgical history and problem list.  Review of Systems Pertinent items noted in HPI and remainder of comprehensive ROS otherwise negative.  Physical Exam:  BP 125/84    Pulse 91    Wt (!) 350 lb (158.8 kg)    LMP  (Approximate) Comment: irregular with Nexplanon   BMI 56.49 kg/m  CONSTITUTIONAL: Well-developed, well-nourished female in no acute distress.  HENT:  Normocephalic, atraumatic, External right and left ear normal.  EYES: Conjunctivae and EOM are normal. Pupils are equal, round, and reactive to light. No scleral icterus.  NECK: Normal range of motion, supple, no masses.  Normal thyroid.  SKIN: Skin  is warm and dry. No rash noted. Not diaphoretic. No erythema. No pallor. MUSCULOSKELETAL: Normal range of motion. No tenderness.  No cyanosis, clubbing, or edema. NEUROLOGIC: Alert and oriented to person, place, and time. Normal reflexes, muscle tone coordination.  PSYCHIATRIC: Normal mood and affect. Normal behavior. Normal judgment and thought content. CARDIOVASCULAR: Normal heart rate noted, regular rhythm RESPIRATORY: Clear to auscultation bilaterally. Effort and breath sounds normal, no problems with respiration noted. BREASTS: Symmetric in size. No masses, tenderness, skin changes, nipple drainage, or lymphadenopathy  bilaterally. Performed in the presence of a chaperone. ABDOMEN: Soft, no distention noted.  No tenderness, rebound or guarding.  PELVIC: Normal appearing external genitalia and urethral meatus; normal appearing vaginal mucosa and cervix.  No abnormal vaginal discharge noted.  Pap smear obtained.  Normal uterine size, no other palpable masses, no uterine or adnexal tenderness.  Performed in the presence of a chaperone.   Assessment and Plan:    1. Encounter for gynecological examination with Papanicolaou smear of cervix Rx: - Cytology - PAP( )  2. Vaginal discharge Rx: - Cervicovaginal ancillary only( [place])  3. Irregular periods Rx: - US PELVIC COMPLETE WITH TRANSVAGINAL; Future  4. Hirsutism Rx: - TSH - Testosterone, Free, Total, SHBG  5. Hot flashes - may be related to hormonal changes with PCOS  6. Screening for diabetes mellitus Rx: - Hemoglobin A1c  7. Encounter for surveillance of Nexplanon subdermal contraceptive - pleased with Nexplanon.  Had severe dysmenorrhea prior to Nexplanon insertion  Will follow up results of pap smear and manage accordingly.  Routine preventative health maintenance measures emphasized. Please refer to After Visit Summary for other counseling recommendations.      Shelly Bombard, MD, [place] [facility]  05/28/2021

## 2021-05-29 LAB — CERVICOVAGINAL ANCILLARY ONLY
Bacterial Vaginitis (gardnerella): NEGATIVE
Candida Glabrata: NEGATIVE
Candida Vaginitis: NEGATIVE
Chlamydia: NEGATIVE
Comment: NEGATIVE
Comment: NEGATIVE
Comment: NEGATIVE
Comment: NEGATIVE
Comment: NEGATIVE
Comment: NORMAL
Neisseria Gonorrhea: NEGATIVE
Trichomonas: NEGATIVE

## 2021-05-31 LAB — TESTOSTERONE, FREE, TOTAL, SHBG
Sex Hormone Binding: 17.8 nmol/L — ABNORMAL LOW (ref 24.6–122.0)
Testosterone, Free: 1.3 pg/mL (ref 0.0–4.2)
Testosterone: 31 ng/dL (ref 8–60)

## 2021-05-31 LAB — HEMOGLOBIN A1C
Est. average glucose Bld gHb Est-mCnc: 111 mg/dL
Hgb A1c MFr Bld: 5.5 % (ref 4.8–5.6)

## 2021-05-31 LAB — TSH: TSH: 1.73 u[IU]/mL (ref 0.450–4.500)

## 2021-06-02 LAB — CYTOLOGY - PAP
Comment: NEGATIVE
Diagnosis: NEGATIVE
High risk HPV: NEGATIVE

## 2021-06-04 ENCOUNTER — Other Ambulatory Visit: Payer: Self-pay

## 2021-06-04 ENCOUNTER — Ambulatory Visit
Admission: RE | Admit: 2021-06-04 | Discharge: 2021-06-04 | Disposition: A | Discharge status: Home / Self Care | Payer: BC Managed Care – PPO | Source: Ambulatory Visit | Attending: Obstetrics | Admitting: Obstetrics

## 2021-06-04 DIAGNOSIS — N926 Irregular menstruation, unspecified: Secondary | ICD-10-CM | POA: Diagnosis present

## 2021-06-07 ENCOUNTER — Emergency Department (HOSPITAL_BASED_OUTPATIENT_CLINIC_OR_DEPARTMENT_OTHER)
Admission: EM | Admit: 2021-06-07 | Discharge: 2021-06-07 | Disposition: A | Discharge status: Home / Self Care | Payer: BC Managed Care – PPO | Attending: Emergency Medicine | Admitting: Emergency Medicine

## 2021-06-07 ENCOUNTER — Encounter (HOSPITAL_BASED_OUTPATIENT_CLINIC_OR_DEPARTMENT_OTHER): Payer: Self-pay | Admitting: Emergency Medicine

## 2021-06-07 ENCOUNTER — Emergency Department (HOSPITAL_BASED_OUTPATIENT_CLINIC_OR_DEPARTMENT_OTHER): Payer: BC Managed Care – PPO

## 2021-06-07 ENCOUNTER — Other Ambulatory Visit: Payer: Self-pay

## 2021-06-07 DIAGNOSIS — M7989 Other specified soft tissue disorders: Secondary | ICD-10-CM | POA: Insufficient documentation

## 2021-06-07 DIAGNOSIS — M79671 Pain in right foot: Secondary | ICD-10-CM | POA: Diagnosis present

## 2021-06-07 MED ORDER — PREDNISONE 10 MG PO TABS: 40.0000 mg | ORAL_TABLET | Freq: Every day | ORAL | 0 refills | Status: AC

## 2021-06-07 MED ORDER — SODIUM CHLORIDE 0.9 % IV BOLUS: 500.0000 mL | Freq: Once | INTRAVENOUS | Status: DC

## 2021-06-07 MED ORDER — DICLOFENAC SODIUM 1 % EX GEL: 2.0000 g | Freq: Four times a day (QID) | CUTANEOUS | 0 refills | Status: DC

## 2021-06-07 MED ORDER — KETOROLAC TROMETHAMINE 15 MG/ML IJ SOLN
15.0000 mg | Freq: Once | INTRAMUSCULAR | Status: AC
Start: 2021-06-07 — End: 2021-06-07
  Administered 2021-06-07: 15 mg via INTRAMUSCULAR
  Filled 2021-06-07: qty 1

## 2021-06-07 NOTE — ED Triage Notes (Signed)
Pt arrives pov, ambulatory to triage with slow gait, reports "knot on posterior right distal  lower leg x 3 days, no injury Tenderness noted.

## 2021-06-07 NOTE — Discharge Instructions (Signed)
I recommend you take the prednisone to help with inflammation. Use Tylenol and Voltaren gel to help with pain control. Use ice to help with pain and swelling.  Continue with elevation and compression. Call the foot doctor below to set up a follow-up appointment as needed for further evaluation. Return to the emergency room with any new, worsening, concerning symptoms.

## 2021-06-07 NOTE — ED Provider Notes (Signed)
[place] EMERGENCY DEPARTMENT Provider Note   CSN: [id number] Arrival date & time: 06/07/21  1710     History  Chief Complaint  Patient presents with   Leg Pain    Kaitlyn Luna is a 31 y.o. female presenting for evaluation of bilateral foot pain, worse on the right.  Patient states the past 2 days she has had bilateral foot pain, worse on the right.  She reports right foot is also more swollen than the left.  She has had similar symptoms before, several years ago.  She was seen by her PCP and podiatry at the time without a diagnosis.  Symptoms eventually resolved on their own.  Patient denies any trauma or injury.  She has tried elevation, compression.  HPI     Home Medications Prior to Admission medications   Medication Sig Start Date End Date Taking? Authorizing Provider  diclofenac Sodium (VOLTAREN) 1 % GEL Apply 2 g topically 4 (four) times daily. 06/07/21  Yes Monette Omara, PA-C  predniSONE (DELTASONE) 10 MG tablet Take 4 tablets (40 mg total) by mouth daily for 4 days. 06/07/21 06/11/21 Yes Noheli Melder, PA-C  albuterol (VENTOLIN HFA) 108 (90 Base) MCG/ACT inhaler Inhale 1-2 puffs into the lungs every 6 (six) hours as needed for wheezing or shortness of breath. 01/05/19   Lamptey, Myrene Galas, MD  gabapentin (NEURONTIN) 300 MG capsule Take 300 mg by mouth at bedtime. 09/30/19   [provider]  ibuprofen (ADVIL,MOTRIN) 600 MG tablet Take 1 tablet (600 mg total) by mouth every 6 (six) hours as needed. 05/27/17   Emily Filbert, MD  Melatonin 5 MG TABS Take 5 mg by mouth at bedtime.    [provider]  mineral oil-hydrophilic petrolatum (AQUAPHOR) ointment Apply topically as needed for dry skin. 09/20/18   Horton, Barbette Hair, MD      Allergies    Lavender oil    Review of Systems   Review of Systems  Musculoskeletal:        Foot pain and swelling  Neurological:  Negative for numbness.   Physical Exam Updated Vital Signs BP (!) 134/93  (BP Location: Right Arm)    Pulse 100    Temp 98.5 F (36.9 C) (Oral)    Resp 20    Ht 5\' 7"  (1.702 m)    Wt (!) 159.2 kg    SpO2 100%    BMI 54.97 kg/m  Physical Exam Vitals and nursing note reviewed.  Constitutional:      General: She is not in acute distress.    Appearance: She is well-developed.  HENT:     Head: Normocephalic and atraumatic.  Eyes:     Extraocular Movements: Extraocular movements intact.  Cardiovascular:     Rate and Rhythm: Normal rate.  Pulmonary:     Effort: Pulmonary effort is normal.  Abdominal:     General: There is no distension.  Musculoskeletal:        General: Swelling and tenderness present.     Cervical back: Normal range of motion.       Feet:  Feet:     Comments: Tender nodule at the insertion point of the Achilles tendon on the heel.  Tenderness of patient of the Achilles tendon.  Right foot is mildly swollen when compared to the left, although exam is limited due to body habitus.  Pedal pulses 2+ bilaterally.  Good distal sensation cap refill of the toes. Skin:    General: Skin is warm.  Capillary Refill: Capillary refill takes less than 2 seconds.     Findings: No rash.  Neurological:     Mental Status: She is alert and oriented to person, place, and time.    ED Results / Procedures / Treatments   Labs (all labs ordered are listed, but only abnormal results are displayed) Labs Reviewed - No data to display  EKG None  Radiology US Venous Img Lower Right (DVT Study)  Result Date: 06/07/2021 CLINICAL DATA:  Right foot pain and swelling EXAM: RIGHT LOWER EXTREMITY VENOUS DOPPLER ULTRASOUND TECHNIQUE: Gray-scale sonography with graded compression, as well as color Doppler and duplex ultrasound were performed to evaluate the lower extremity deep venous systems from the level of the common femoral vein and including the common femoral, femoral, profunda femoral, popliteal and calf veins including the posterior tibial, peroneal and  gastrocnemius veins when visible. The superficial great saphenous vein was also interrogated. Spectral Doppler was utilized to evaluate flow at rest and with distal augmentation maneuvers in the common femoral, femoral and popliteal veins. COMPARISON:  None. FINDINGS: Contralateral Common Femoral Vein: Respiratory phasicity is normal and symmetric with the symptomatic side. No evidence of thrombus. Normal compressibility. Common Femoral Vein: No evidence of thrombus. Normal compressibility, respiratory phasicity and response to augmentation. Saphenofemoral Junction: No evidence of thrombus. Normal compressibility and flow on color Doppler imaging. Profunda Femoral Vein: No evidence of thrombus. Normal compressibility and flow on color Doppler imaging. Femoral Vein: No evidence of thrombus. Normal compressibility, respiratory phasicity and response to augmentation. Popliteal Vein: No evidence of thrombus. Normal compressibility, respiratory phasicity and response to augmentation. Calf Veins: No evidence of thrombus. Normal compressibility and flow on color Doppler imaging. Superficial Great Saphenous Vein: No evidence of thrombus. Normal compressibility. Venous Reflux:  None. Other Findings:  None. IMPRESSION: No evidence of deep venous thrombosis. Electronically Signed   By: Inez Catalina M.D.   On: 06/07/2021 19:06    Procedures Procedures    Medications Ordered in ED Medications  ketorolac (TORADOL) 15 MG/ML injection 15 mg (15 mg Intramuscular Given 06/07/21 1924)    ED Course/ Medical Decision Making/ A&P                           Medical Decision Making Amount and/or Complexity of Data Reviewed ECG/medicine tests: ordered.  Risk Prescription drug management.    This patient presents to the ED for concern of foot pain and swelling. This involves a number of treatment options, and is a complaint that carries with it a moderate risk of complications and morbidity.  The differential diagnosis  includes DVT, fracture, infection, blister, tendinitis.   Additional history: I reviewed chart from 2019 in which patient was seen for similar symptoms.  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     Imaging Studies:  I ordered imaging studies including [place] to r/o DVT due to R foot being swollen when compared to L.  I independently visualized and interpreted imaging which showed no DVT or acute abnormality. I agree with the radiologist interpretation  Medicines ordered:  I ordered medication including toradol  for pain and swelling   Test Considered:  As patient did not have a fall or injury, and it is diffuse swelling without focal pain, do not feel patient needs x-ray as I have low suspicion for bony abnormality  Disposition:  After consideration of the diagnostic results and the patients response to treatment, I  feel that the patent would benefit from outpatient management.  Will give burst of prednisone for presumed inflammatory cause.  We will have patient follow-up with podiatry.  Voltaren gel given for pain as well.  At this time, patient appears safe for discharge.  Return precautions given.  Patient states she understanding and agrees to plan.   Final Clinical Impression(s) / ED Diagnoses Final diagnoses:  Swelling of right foot  Foot pain, right    Rx / DC Orders ED Discharge Orders          Ordered    predniSONE (DELTASONE) 10 MG tablet  Daily        06/07/21 1918    diclofenac Sodium (VOLTAREN) 1 % GEL  4 times daily        06/07/21 [place], Yared Barefoot, PA-C 06/07/21 1952    Sherwood Gambler, MD 06/07/21 2028

## 2021-06-10 ENCOUNTER — Ambulatory Visit (INDEPENDENT_AMBULATORY_CARE_PROVIDER_SITE_OTHER): Payer: BC Managed Care – PPO | Admitting: Podiatry

## 2021-06-10 ENCOUNTER — Other Ambulatory Visit: Payer: Self-pay

## 2021-06-10 ENCOUNTER — Ambulatory Visit (INDEPENDENT_AMBULATORY_CARE_PROVIDER_SITE_OTHER): Payer: BC Managed Care – PPO

## 2021-06-10 DIAGNOSIS — M25571 Pain in right ankle and joints of right foot: Secondary | ICD-10-CM

## 2021-06-10 DIAGNOSIS — M7751 Other enthesopathy of right foot: Secondary | ICD-10-CM | POA: Diagnosis not present

## 2021-06-10 DIAGNOSIS — R200 Anesthesia of skin: Secondary | ICD-10-CM

## 2021-06-10 DIAGNOSIS — M79671 Pain in right foot: Secondary | ICD-10-CM

## 2021-06-10 DIAGNOSIS — R609 Edema, unspecified: Secondary | ICD-10-CM | POA: Diagnosis not present

## 2021-06-11 ENCOUNTER — Encounter: Payer: Self-pay | Admitting: Obstetrics

## 2021-06-11 ENCOUNTER — Telehealth (INDEPENDENT_AMBULATORY_CARE_PROVIDER_SITE_OTHER): Payer: BC Managed Care – PPO | Admitting: Obstetrics

## 2021-06-11 DIAGNOSIS — N941 Unspecified dyspareunia: Secondary | ICD-10-CM | POA: Diagnosis not present

## 2021-06-11 DIAGNOSIS — L680 Hirsutism: Secondary | ICD-10-CM

## 2021-06-11 NOTE — Progress Notes (Signed)
GYNECOLOGY VIRTUAL VISIT ENCOUNTER NOTE  [facility]   Patient location: Home  I connected with Kaitlyn KaMiranda Weintraub on 06/11/21 at 11:15 AM EST by MyChart Video Encounter and verified that I am speaking with the correct person using two identifiers.   I discussed the limitations, risks, security and privacy concerns of performing an evaluation and management service virtually and the availability of in person appointments. I also discussed with the patient that there may be a patient responsible charge related to this service. The patient expressed understanding and agreed to proceed.   History:  Kaitlyn Luna is a 31 y.o. G0P0000 female being evaluated today for lab and ultrasound results.  Has history of hirsutism and dyspareunia.  She denies any abnormal vaginal discharge, bleeding, pelvic pain or other concerns.       Past Medical History:  Diagnosis Date   Anemia    Anxiety    Asthma    Bipolar depression (HCC)    Chronic kidney disease    UTI AND FREQUENT KIDNEY INFECTIONS FREQUENTLY   Depression    Pneumonia 2017   PTSD (post-traumatic stress disorder)    Sleep apnea    not using c-pap   Past Surgical History:  Procedure Laterality Date   BREAST SURGERY     REDUCTI0N   CERVICAL CONIZATION W/BX N/A 05/27/2017   Procedure: CONIZATION CERVIX WITH BIOPSY - COLD KNIFE;  Surgeon: Allie Bossierove, Myra C, MD;  Location: [facility];  Service: Gynecology;  Laterality: N/A;   KNEE ARTHROSCOPY Left    TONSILLECTOMY     The following portions of the patient's history were reviewed and updated as appropriate: allergies, current medications, past family history, past medical history, past social history, past surgical history and problem list.   Health Maintenance:  Normal pap and negative HRHPV on 07-08-2021.    Review of Systems:  Pertinent items noted in HPI and remainder of comprehensive ROS otherwise negative.  Physical Exam:   General:   Alert, oriented and cooperative. Patient appears to be in no acute distress.  Mental Status: Normal mood and affect. Normal behavior. Normal judgment and thought content.   Respiratory: Normal respiratory effort, no problems with respiration noted  Rest of physical exam deferred due to type of encounter  Labs and Imaging Results for orders placed or performed in visit on 05/28/21 (from the past 336 hour(s))  Cervicovaginal ancillary only( [place])   Collection Time: 05/28/21 11:48 AM  Result Value Ref Range   Neisseria Gonorrhea Negative    Chlamydia Negative    Trichomonas Negative    Bacterial Vaginitis (gardnerella) Negative    Candida Vaginitis Negative    Candida Glabrata Negative    Comment Normal Reference Range Candida Species - Negative    Comment Normal Reference Range Candida Galbrata - Negative    Comment Normal Reference Range Trichomonas - Negative    Comment Normal Reference Ranger Chlamydia - Negative    Comment      Normal Reference Range Neisseria Gonorrhea - Negative   Comment      Normal Reference Range Bacterial Vaginosis - Negative  Hemoglobin A1c   Collection Time: 05/28/21 11:50 AM  Result Value Ref Range   Hgb A1c MFr Bld 5.5 4.8 - 5.6 %   Est. average glucose Bld gHb Est-mCnc 111 mg/dL  TSH   Collection Time: 05/28/21 11:50 AM  Result Value Ref Range   TSH 1.730 0.450 - 4.500 uIU/mL  Testosterone, Free, Total, SHBG  Collection Time: 05/28/21 11:50 AM  Result Value Ref Range   Testosterone 31 8 - 60 ng/dL   Testosterone, Free 1.3 0.0 - 4.2 pg/mL   Sex Hormone Binding 17.8 (L) 24.6 - 122.0 nmol/L   US Venous Img Lower Right (DVT Study)  Result Date: 06/07/2021 CLINICAL DATA:  Right foot pain and swelling EXAM: RIGHT LOWER EXTREMITY VENOUS DOPPLER ULTRASOUND TECHNIQUE: Gray-scale sonography with graded compression, as well as color Doppler and duplex ultrasound were performed to evaluate the lower extremity deep venous systems from the level of  the common femoral vein and including the common femoral, femoral, profunda femoral, popliteal and calf veins including the posterior tibial, peroneal and gastrocnemius veins when visible. The superficial great saphenous vein was also interrogated. Spectral Doppler was utilized to evaluate flow at rest and with distal augmentation maneuvers in the common femoral, femoral and popliteal veins. COMPARISON:  None. FINDINGS: Contralateral Common Femoral Vein: Respiratory phasicity is normal and symmetric with the symptomatic side. No evidence of thrombus. Normal compressibility. Common Femoral Vein: No evidence of thrombus. Normal compressibility, respiratory phasicity and response to augmentation. Saphenofemoral Junction: No evidence of thrombus. Normal compressibility and flow on color Doppler imaging. Profunda Femoral Vein: No evidence of thrombus. Normal compressibility and flow on color Doppler imaging. Femoral Vein: No evidence of thrombus. Normal compressibility, respiratory phasicity and response to augmentation. Popliteal Vein: No evidence of thrombus. Normal compressibility, respiratory phasicity and response to augmentation. Calf Veins: No evidence of thrombus. Normal compressibility and flow on color Doppler imaging. Superficial Great Saphenous Vein: No evidence of thrombus. Normal compressibility. Venous Reflux:  None. Other Findings:  None. IMPRESSION: No evidence of deep venous thrombosis. Electronically Signed   By: Alcide Clever M.D.   On: 06/07/2021 19:06   DG Foot 2 Views Right  Result Date: 06/10/2021 Please see detailed radiograph report in office note.  DG Foot Complete Right  Result Date: 06/10/2021 Please see detailed radiograph report in office note.  US PELVIC COMPLETE WITH TRANSVAGINAL  Result Date: 06/04/2021 CLINICAL DATA:  Irregular menses, history of ovarian cysts, LMP 12/11/2019, has Nexplanon EXAM: TRANSABDOMINAL AND TRANSVAGINAL ULTRASOUND OF PELVIS TECHNIQUE: Both  transabdominal and transvaginal ultrasound examinations of the pelvis were performed. Transabdominal technique was performed for global imaging of the pelvis including uterus, ovaries, adnexal regions, and pelvic cul-de-sac. It was necessary to proceed with endovaginal exam following the transabdominal exam to visualize the uterus, endometrium, and ovaries. COMPARISON:  11/01/2015 FINDINGS: Uterus Measurements: 6.4 x 3.4 x 4.1 cm = volume: 46 mL. Anteverted. Heterogeneous myometrium. Few scattered areas of shadowing. No discrete uterine mass. Endometrium Thickness: 3 mm.  No endometrial fluid or mass Right ovary Measurements: 1.6 x 1.4 x 1.7 cm = volume: 1.9 mL. Normal morphology without mass Left ovary Measurements: 2.6 x 1.7 x 1.7 cm = volume: 3.9 mL. Normal morphology without mass Other findings No free pelvic fluid or adnexal masses. Trace nonspecific endocervical canal fluid. IMPRESSION: Unremarkable ovaries, adnexa, and endometrial complex. Trace nonspecific endocervical fluid. No focal uterine abnormalities. Electronically Signed   By: Ulyses Southward M.D.   On: 06/04/2021 12:01       Assessment and Plan:     1. Hirsutism Rx: - Ambulatory referral to Endocrinology - Ambulatory referral to Dermatology  2. Dyspareunia in female Rx: - Ambulatory referral to Reproductive Endocrinology       I discussed the assessment and treatment plan with the patient. The patient was provided an opportunity to ask questions and all were answered. The patient  agreed with the plan and demonstrated an understanding of the instructions.   The patient was advised to call back or seek an in-person evaluation/go to the ED if the symptoms worsen or if the condition fails to improve as anticipated.  I have spent a total of 20 minutes of face-to-face time, excluding clinical staff time, reviewing notes and preparing to see patient, ordering tests and/or medications, and counseling the patient.    Coral Ceo,  MD Center for [facility], [facility], [facility] 06/11/21

## 2021-06-11 NOTE — Progress Notes (Signed)
S/w pt for virtual follow up appt after visit on 05-28-21

## 2021-06-13 ENCOUNTER — Other Ambulatory Visit: Payer: Self-pay

## 2021-06-13 ENCOUNTER — Encounter (HOSPITAL_BASED_OUTPATIENT_CLINIC_OR_DEPARTMENT_OTHER): Payer: Self-pay | Admitting: *Deleted

## 2021-06-13 ENCOUNTER — Emergency Department (HOSPITAL_BASED_OUTPATIENT_CLINIC_OR_DEPARTMENT_OTHER)
Admission: EM | Admit: 2021-06-13 | Discharge: 2021-06-13 | Disposition: A | Discharge status: Home / Self Care | Payer: BC Managed Care – PPO | Attending: Emergency Medicine | Admitting: Emergency Medicine

## 2021-06-13 DIAGNOSIS — M79661 Pain in right lower leg: Secondary | ICD-10-CM | POA: Diagnosis present

## 2021-06-13 DIAGNOSIS — R202 Paresthesia of skin: Secondary | ICD-10-CM | POA: Diagnosis not present

## 2021-06-13 DIAGNOSIS — G8929 Other chronic pain: Secondary | ICD-10-CM | POA: Diagnosis not present

## 2021-06-13 DIAGNOSIS — M25571 Pain in right ankle and joints of right foot: Secondary | ICD-10-CM | POA: Insufficient documentation

## 2021-06-13 DIAGNOSIS — Z79899 Other long term (current) drug therapy: Secondary | ICD-10-CM | POA: Insufficient documentation

## 2021-06-13 DIAGNOSIS — R200 Anesthesia of skin: Secondary | ICD-10-CM | POA: Diagnosis not present

## 2021-06-13 LAB — HLA-B27 ANTIGEN: HLA-B27 Antigen: NEGATIVE

## 2021-06-13 LAB — CBC WITH DIFFERENTIAL/PLATELET
Absolute Monocytes: 497 cells/uL (ref 200–950)
Basophils Absolute: 57 cells/uL (ref 0–200)
Basophils Relative: 0.4 %
Eosinophils Absolute: 14 cells/uL — ABNORMAL LOW (ref 15–500)
Eosinophils Relative: 0.1 %
HCT: 37 % (ref 35.0–45.0)
Hemoglobin: 12.4 g/dL (ref 11.7–15.5)
Lymphs Abs: 1690 cells/uL (ref 850–3900)
MCH: 29.2 pg (ref 27.0–33.0)
MCHC: 33.5 g/dL (ref 32.0–36.0)
MCV: 87.1 fL (ref 80.0–100.0)
MPV: 10.7 fL (ref 7.5–12.5)
Monocytes Relative: 3.5 %
Neutro Abs: 11942 cells/uL — ABNORMAL HIGH (ref 1500–7800)
Neutrophils Relative %: 84.1 %
Platelets: 401 10*3/uL — ABNORMAL HIGH (ref 140–400)
RBC: 4.25 10*6/uL (ref 3.80–5.10)
RDW: 12.4 % (ref 11.0–15.0)
Total Lymphocyte: 11.9 %
WBC: 14.2 10*3/uL — ABNORMAL HIGH (ref 3.8–10.8)

## 2021-06-13 LAB — BASIC METABOLIC PANEL
BUN: 12 mg/dL (ref 7–25)
CO2: 24 mmol/L (ref 20–32)
Calcium: 9.2 mg/dL (ref 8.6–10.2)
Chloride: 106 mmol/L (ref 98–110)
Creat: 0.71 mg/dL (ref 0.50–0.97)
Glucose, Bld: 123 mg/dL — ABNORMAL HIGH (ref 65–99)
Potassium: 4.4 mmol/L (ref 3.5–5.3)
Sodium: 138 mmol/L (ref 135–146)

## 2021-06-13 LAB — C-REACTIVE PROTEIN: CRP: 16.9 mg/L — ABNORMAL HIGH (ref <8.0)

## 2021-06-13 LAB — SEDIMENTATION RATE: Sed Rate: 46 mm/h — ABNORMAL HIGH (ref 0–20)

## 2021-06-13 LAB — ANA: Anti Nuclear Antibody (ANA): NEGATIVE

## 2021-06-13 LAB — RHEUMATOID FACTOR: Rheumatoid fact SerPl-aCnc: <14 IU/mL (ref <14)

## 2021-06-13 NOTE — ED Provider Notes (Signed)
[place] EMERGENCY DEPARTMENT Provider Note   CSN: [id number] Arrival date & time: 06/13/21  1254     History  Chief Complaint  Patient presents with   Leg Pain    Kaitlyn Luna is a 31 y.o. female.  Patient here for evaluation of right lower leg pain.  She is currently in some sort of compression type cast to her right lower leg for leg swelling.  She follows with podiatry.  She has had negative DVT study and negative x-rays.  They placed her in this compressive dressing for 2 weeks.  She has been in it for several days but she is having a lot of discomfort with it.  The history is provided by the patient.  Leg Pain Lower extremity pain location: right lower leg pain. Pain details:    Quality:  Aching   Radiates to:  Does not radiate   Severity:  Mild   Onset quality:  Gradual   Timing:  Intermittent   Progression:  Unchanged Chronicity:  Recurrent Relieved by:  Nothing Worsened by:  Nothing Associated symptoms: numbness and tingling   Associated symptoms: no decreased ROM, no fatigue, no fever, no itching and no muscle weakness       Home Medications Prior to Admission medications   Medication Sig Start Date End Date Taking? Authorizing Provider  albuterol (VENTOLIN HFA) 108 (90 Base) MCG/ACT inhaler Inhale 1-2 puffs into the lungs every 6 (six) hours as needed for wheezing or shortness of breath. 01/05/19   Lamptey, Myrene Galas, MD  diclofenac Sodium (VOLTAREN) 1 % GEL Apply 2 g topically 4 (four) times daily. 06/07/21   Caccavale, Sophia, PA-C  gabapentin (NEURONTIN) 300 MG capsule Take 300 mg by mouth at bedtime. 09/30/19   [provider]  ibuprofen (ADVIL,MOTRIN) 600 MG tablet Take 1 tablet (600 mg total) by mouth every 6 (six) hours as needed. 05/27/17   Emily Filbert, MD  Melatonin 5 MG TABS Take 5 mg by mouth at bedtime.    [provider]  mineral oil-hydrophilic petrolatum (AQUAPHOR) ointment Apply topically as needed for dry skin.  09/20/18   Horton, Barbette Hair, MD      Allergies    Lavender oil    Review of Systems   Review of Systems  Constitutional:  Negative for fatigue and fever.  Skin:  Negative for itching.   Physical Exam Updated Vital Signs BP 124/89 (BP Location: Left Arm)    Pulse 78    Temp 98.5 F (36.9 C) (Oral)    Resp 18    Ht 5\' 7"  (1.702 m)    Wt (!) 159.2 kg    SpO2 99%    BMI 54.97 kg/m  Physical Exam Constitutional:      General: She is not in acute distress.    Appearance: She is not ill-appearing.  Cardiovascular:     Pulses: Normal pulses.     Heart sounds: Normal heart sounds.  Musculoskeletal:        General: Tenderness present. No swelling.     Cervical back: Normal range of motion.  Skin:    General: Skin is warm.     Capillary Refill: Capillary refill takes less than 2 seconds.     Findings: No erythema.  Neurological:     General: No focal deficit present.     Mental Status: She is alert.     Sensory: No sensory deficit.     Motor: No weakness.    ED Results /  Procedures / Treatments   Labs (all labs ordered are listed, but only abnormal results are displayed) Labs Reviewed - No data to display  EKG None  Radiology No results found.  Procedures Procedures    Medications Ordered in ED Medications - No data to display  ED Course/ Medical Decision Making/ A&P                           Medical Decision Making  Kaitlyn Luna is here with pain at her right compressive dressing.  Normal vitals.  No fever.  Patient here as she is having pain over this splint that she had placed that podiatrist recently.  She has been having some ongoing right ankle pain.  She has been in a compressive wrap been.  She had negative DVT study and negative x-rays.  She is feeling tingling in her feet and cannot tolerate distressing.  She has been ambulatory on it as well.  Dressing was removed.  She is neurovascular neuromuscular intact.  There is no obvious major swelling of the  joint.  No redness.  No concern for septic joint or fracture or DVT.  She has had these things ruled out here recently.  Suspect some sort of inflammatory process.  No concern for arterial occlusion as she has good pulses in the foot.  She has normal strength and sensation as well.  We will place her in a Cam walking boot and have her follow-up with her foot doctor.  Discharged in good condition.  This chart was dictated using voice recognition software.  Despite best efforts to proofread,  errors can occur which can change the documentation meaning.         Final Clinical Impression(s) / ED Diagnoses Final diagnoses:  Chronic pain of right ankle    Rx / DC Orders ED Discharge Orders     None         Lennice Sites, DO 06/13/21 1333

## 2021-06-13 NOTE — ED Notes (Signed)
Discharge instructions discussed with pt. Pt verbalized understanding. Pt stable and ambulatory.  °

## 2021-06-13 NOTE — Discharge Instructions (Signed)
Follow-up with foot doctor.

## 2021-06-13 NOTE — ED Triage Notes (Signed)
She was seen here a week ago for swelling in her right leg. They applied a soft cast. Pt states now her foot is numb and she feels the cast is cutting her circulation off.

## 2021-06-16 NOTE — Progress Notes (Signed)
Subjective:   Patient ID: Kaitlyn Luna, female   DOB: 31 y.o.   MRN: [id number]   HPI 31 year old female presents the office today for concerns of swelling to her right foot that started about 4 days ago.  She states that about the size of a football she has a numb sensation to her foot.  She states the last time this happened was when she was having GYN issues.  She went to the emergency room on Saturday.  She has noticed a small knot on the side of her ankle.  She also describes a burning sensation under her skin.  No injury.  No other concerns today.   Review of Systems  All other systems reviewed and are negative.  Past Medical History:  Diagnosis Date   Anemia    Anxiety    Asthma    Bipolar depression (HCC)    Chronic kidney disease    UTI AND FREQUENT KIDNEY INFECTIONS FREQUENTLY   Depression    Pneumonia 2017   PTSD (post-traumatic stress disorder)    Sleep apnea    not using c-pap    Past Surgical History:  Procedure Laterality Date   BREAST SURGERY     REDUCTI0N   CERVICAL CONIZATION W/BX N/A 05/27/2017   Procedure: CONIZATION CERVIX WITH BIOPSY - COLD KNIFE;  Surgeon: Allie Bossier, MD;  Location: [facility];  Service: Gynecology;  Laterality: N/A;   KNEE ARTHROSCOPY Left    TONSILLECTOMY       Current Outpatient Medications:    albuterol (VENTOLIN HFA) 108 (90 Base) MCG/ACT inhaler, Inhale 1-2 puffs into the lungs every 6 (six) hours as needed for wheezing or shortness of breath., Disp: 6.7 g, Rfl: 0   diclofenac Sodium (VOLTAREN) 1 % GEL, Apply 2 g topically 4 (four) times daily., Disp: 100 g, Rfl: 0   gabapentin (NEURONTIN) 300 MG capsule, Take 300 mg by mouth at bedtime., Disp: , Rfl:    ibuprofen (ADVIL,MOTRIN) 600 MG tablet, Take 1 tablet (600 mg total) by mouth every 6 (six) hours as needed., Disp: 30 tablet, Rfl: 1   Melatonin 5 MG TABS, Take 5 mg by mouth at bedtime., Disp: , Rfl:    mineral oil-hydrophilic petrolatum (AQUAPHOR) ointment, Apply topically as  needed for dry skin., Disp: 420 g, Rfl: 0  Allergies  Allergen Reactions   Lavender Oil Hives          Objective:  Physical Exam  General: AAO x3, NAD  Dermatological: Edema present to the right foot but there is no significant erythema or increase in warmth but there is no open lesions.  Vascular: Dorsalis Pedis artery and Posterior Tibial artery pedal pulses are 2/4 bilateral with immedate capillary fill time.  There is no pain with calf compression, swelling, warmth, erythema.   Neruologic: Sensation appears intact presents wanting monofilament however she described burning, numbness to her foot upon palpation.  Musculoskeletal: Mild diffuse tenderness in the forefoot but no evidence of pinpoint tenderness.  Edema present to the foot.  No erythema or warmth.  There is no areas of fluctuation or crepitation.  Muscular strength 5/5 in all groups tested bilateral.      Assessment:   Right foot acute swelling     Plan:  -Treatment options discussed including all alternatives, risks, and complications -Etiology of symptoms were discussed -X-rays were obtained and reviewed with the patient.  Not able to identify any evidence of acute fracture or stress fracture. -Unna boot was applied today. -Recommended mobilization.. -  Ice and elevation -Blood work ordered. -She has follow-up with her GYN tomorrow.  She states that previously when she had issues she had swelling to her leg.   Ovid Curd, DPM

## 2021-06-17 ENCOUNTER — Telehealth: Payer: Self-pay | Admitting: Podiatry

## 2021-06-17 NOTE — Telephone Encounter (Signed)
Patient called to let you know she is home, you can call now.

## 2021-06-17 NOTE — Telephone Encounter (Signed)
Patient was returning call about her lab work.  She will be available to speak with you after 3pm today.

## 2021-06-24 ENCOUNTER — Other Ambulatory Visit: Payer: Self-pay

## 2021-06-24 ENCOUNTER — Ambulatory Visit (INDEPENDENT_AMBULATORY_CARE_PROVIDER_SITE_OTHER): Payer: BC Managed Care – PPO | Admitting: Podiatry

## 2021-06-24 DIAGNOSIS — M778 Other enthesopathies, not elsewhere classified: Secondary | ICD-10-CM

## 2021-06-24 DIAGNOSIS — M792 Neuralgia and neuritis, unspecified: Secondary | ICD-10-CM | POA: Diagnosis not present

## 2021-06-24 DIAGNOSIS — R609 Edema, unspecified: Secondary | ICD-10-CM | POA: Diagnosis not present

## 2021-06-25 ENCOUNTER — Telehealth: Payer: Self-pay | Admitting: Podiatry

## 2021-06-25 MED ORDER — GABAPENTIN 300 MG PO CAPS: 300.0000 mg | ORAL_CAPSULE | Freq: Every day | ORAL | 0 refills | Status: DC

## 2021-06-25 NOTE — Telephone Encounter (Signed)
Patient called and stated she was suppose to have gabapentin sent to her pharmacy and they haven't received it yet.

## 2021-06-28 NOTE — Progress Notes (Signed)
Subjective: 31 year old female presents the office today for follow-up evaluation of swelling to her right foot.  She said that she started on the left side as well.  No injuries that she reports the symptoms continue.  Also she gets burning to her foot.  Objective: AAO x3, NAD DP/PT pulses palpable bilaterally, CRT less than 3 seconds Edema still noted to the foot and starting on the left side as well.  Describes burning sensation with sensation appears to be intact with Phoebe Perch monofilament.  No specific area pinpoint tenderness but diffuse discomfort mostly to the forefoot on the right side.  No pain with calf compression, swelling, warmth, erythema  Assessment: Right foot swelling, neuritis  Plan: -All treatment options discussed with the patient including all alternatives, risks, complications.  -At this point given her symptoms will order MRI of the foot to rule out any underlying pathology.  Also start gabapentin.  She did on his previous without side effects.  If no improvement refer to possible neurology given the nerve symptoms.  Swelling is unusual.  Sed rate, CRP is abnormal. -Patient encouraged to call the office with any questions, concerns, change in symptoms.   Vivi Barrack DPM

## 2021-07-22 ENCOUNTER — Other Ambulatory Visit: Payer: Self-pay | Admitting: Podiatry

## 2021-08-20 ENCOUNTER — Telehealth: Payer: Self-pay | Admitting: Emergency Medicine

## 2021-08-20 ENCOUNTER — Ambulatory Visit
Admission: EM | Admit: 2021-08-20 | Discharge: 2021-08-20 | Disposition: A | Discharge status: Home / Self Care | Payer: BC Managed Care – PPO

## 2021-08-20 DIAGNOSIS — M25562 Pain in left knee: Secondary | ICD-10-CM

## 2021-08-20 MED ORDER — DICLOFENAC SODIUM 1 % EX GEL: 4.0000 g | Freq: Four times a day (QID) | CUTANEOUS | 1 refills | Status: DC

## 2021-08-20 NOTE — ED Triage Notes (Signed)
Pt states she fell of the bed to her left knee this morning, she reports she had surgery to the left knee in the past.  ?Pt is ambulatory.  ?

## 2021-08-20 NOTE — Telephone Encounter (Signed)
Refill request

## 2021-08-20 NOTE — ED Provider Notes (Signed)
Patient states she fell off her bed and fell onto her left knee this morning.  Patient states she had surgery on her left knee 8 years ago for meniscal tear, was told that she does not have a lot of meniscus left knee.  Patient states she did not hear any crunching or popping when she fell but is concerned that she is reinjured her knee.  Patient states she is able to ambulate but has pain when she does so. ? ?Patient advised that since she is a patient at [facility], I recommend that she go to the urgent care clinic this evening, patient provided with address and hours of operation.  No services were provided during this visit today. ?  ?Theadora Rama Scales, PA-C ?08/20/21 4332 ? ?

## 2021-09-16 ENCOUNTER — Encounter: Payer: Self-pay | Admitting: Emergency Medicine

## 2021-09-16 ENCOUNTER — Ambulatory Visit
Admission: EM | Admit: 2021-09-16 | Discharge: 2021-09-16 | Disposition: A | Discharge status: Home / Self Care | Payer: BC Managed Care – PPO

## 2021-09-16 DIAGNOSIS — L259 Unspecified contact dermatitis, unspecified cause: Secondary | ICD-10-CM

## 2021-09-16 NOTE — ED Triage Notes (Signed)
Pt here with scalp itching and exposure to head lice x 3 days. Pt also endorses change in shampoo.  ?

## 2021-09-16 NOTE — ED Provider Notes (Signed)
?[facility] ? ? ? ?CSN: [id number] ?Arrival date & time: 09/16/21  1703 ? ? ?  ? ?History   ?Chief Complaint ?Chief Complaint  ?Patient presents with  ? Scalp Itching  ? Exposure to Head Lice  ? ? ?HPI ?Kaitlyn Luna is a 31 y.o. female.  ?Patient presents with scalp itching that began 3 days ago.  She had possible exposure to lice when she visited her sister last week.  Also notes a change in her shampoo about 5 days ago.  She admits to sensitive skin but does not usually have itchy scalp.  She has been itching it which seems to make it worse.  Patient is worried she has lice infestation. ?Does have a history of eczema. ?Denies noticing any insects or eggs on the scalp, scaling, rash anywhere else on the body. ? ?Past Medical History:  ?Diagnosis Date  ? Anemia   ? Anxiety   ? Asthma   ? Bipolar depression (HCC)   ? Chronic kidney disease   ? UTI AND FREQUENT KIDNEY INFECTIONS FREQUENTLY  ? Depression   ? Pneumonia 2017  ? PTSD (post-traumatic stress disorder)   ? Sleep apnea   ? not using c-pap  ? ? ?Patient Active Problem List  ? Diagnosis Date Noted  ? Acute pain of left knee 08/20/2021  ? Trichomoniasis 12/26/2019  ? History of heavy periods 08/14/2019  ? CIN II (cervical intraepithelial neoplasia II) 07/07/2017  ? 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 Right ankle pain 04/19/2017  ? Morbid obesity (HCC) 02/01/2017  ? PTSD (post-traumatic stress disorder) 07/26/2013  ? 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 ? ?Past Surgical History:  ?Procedure Laterality Date  ? BREAST SURGERY    ? REDUCTI0N  ? CERVICAL CONIZATION W/BX N/A 05/27/2017  ? Procedure: CONIZATION CERVIX WITH BIOPSY - COLD KNIFE;  Surgeon: Allie Bossier, MD;  Location: [facility];  Service: Gynecology;  Laterality: N/A;  ? KNEE ARTHROSCOPY Left   ? TONSILLECTOMY    ? ? ?OB History   ? ? Gravida  ?0  ? Para  ?0  ? Term  ?0  ? Preterm  ?0  ? AB  ?0  ? Living  ?0  ?  ? ? SAB  ?0  ? IAB  ?0  ? Ectopic  ?0  ?  Multiple  ?0  ? Live Births  ?   ?   ?  ?  ? ? ?Home Medications   ? ?Prior to Admission medications   ?Medication Sig Start Date End Date Taking? Authorizing Provider  ?diclofenac Sodium (VOLTAREN) 1 % GEL Apply 4 g topically 4 (four) times daily. 08/20/21   Theadora Rama Scales, PA-C  ?Melatonin 5 MG TABS Take 5 mg by mouth at bedtime.    [provider]  ? ? ?Family History ?Family History  ?Problem Relation Age of Onset  ? Diabetes Father   ? ? ?Social History ?Social History  ? ?Tobacco Use  ? Smoking status: Former  ?  Types: Cigarettes  ?  Quit date: 12/07/2015  ?  Years since quitting: 5.7  ? Smokeless tobacco: Never  ?Vaping Use  ? Vaping Use: Never used  ?Substance Use Topics  ? Alcohol use: Not Currently  ?  Comment: occ  ? Drug use: No  ? ? ? ?Allergies   ?Lavender oil ? ? ?Review of Systems ?Review of Systems ?As per HPI ? ?Physical Exam ?Triage Vital Signs ?ED Triage Vitals  ?  Enc Vitals Group  ?   BP 09/16/21 1711 129/79  ?   Pulse Rate 09/16/21 1711 79  ?   Resp 09/16/21 1711 20  ?   Temp 09/16/21 1711 98.1 ?F (36.7 ?C)  ?   Temp src --   ?   SpO2 09/16/21 1711 98 %  ?   Weight --   ?   Height --   ?   Head Circumference --   ?   Peak Flow --   ?   Pain Score 09/16/21 1716 0  ?   Pain Loc --   ?   Pain Edu? --   ?   Excl. in GC? --   ? ?No data found. ? ?Updated Vital Signs ?BP 129/79   Pulse 79   Temp 98.1 ?F (36.7 ?C)   Resp 20   SpO2 98%  ?  ? ?Physical Exam ?Vitals and nursing note reviewed.  ?Eyes:  ?   Conjunctiva/sclera: Conjunctivae normal.  ?Cardiovascular:  ?   Rate and Rhythm: Normal rate and regular rhythm.  ?   Heart sounds: Normal heart sounds.  ?Pulmonary:  ?   Effort: Pulmonary effort is normal.  ?   Breath sounds: Normal breath sounds.  ?Skin: ?   Findings: No erythema, lesion or rash.  ?   Comments: No noted rash on the scalp, no louse or eggs seen on hair shaft or scalp.  Exam is unremarkable  ?Neurological:  ?   Mental Status: She is alert.  ? ? ?UC Treatments /  Results  ?Labs ?(all labs ordered are listed, but only abnormal results are displayed) ?Labs Reviewed - No data to display ? ?EKG ? ?Radiology ?No results found. ? ?Procedures ?Procedures (including critical care time) ? ?Medications Ordered in UC ?Medications - No data to display ? ?Initial Impression / Assessment and Plan / UC Course  ?I have reviewed the triage vital signs and the nursing notes. ? ?Pertinent labs & imaging results that were available during my care of the patient were reviewed by me and considered in my medical decision making (see chart for details). ? ?  ?At this time I believe patient has a contact dermatitis possibly due to her new shampoo.  There is no evidence of lice, nits, or eggs to suggest infestation.  Do not see any lesions, erythema, excoriations, scaling or crusting.  Recommended to patient to switch back to her old shampoo or try a sensitive skin shampoo.  Advised to not itch scalp if possible.  Patient is pleased to hear no lice or eggs noted on scalp.  Return precautions were discussed and she is discharged in stable condition. ? ?Final Clinical Impressions(s) / UC Diagnoses  ? ?Final diagnoses:  ?Contact dermatitis, unspecified contact dermatitis type, unspecified trigger  ? ? ? ?Discharge Instructions   ? ?  ?I recommend switching back to your old shampoo or trying a sensitive skin shampoo. ? ?Please return to the urgent care or emergency department if symptoms worsen or do not improve. ? ? ? ?ED Prescriptions   ?None ?  ? ?PDMP not reviewed this encounter. ?  ?Melaina Howerton, Lurena Joiner, PA-C ?09/16/21 1744 ? ?

## 2021-09-16 NOTE — Discharge Instructions (Addendum)
I recommend switching back to your old shampoo or trying a sensitive skin shampoo. ? ?Please return to the urgent care or emergency department if symptoms worsen or do not improve. ?

## 2021-11-17 ENCOUNTER — Ambulatory Visit
Admission: RE | Admit: 2021-11-17 | Discharge: 2021-11-17 | Disposition: A | Discharge status: Home / Self Care | Payer: BC Managed Care – PPO | Source: Ambulatory Visit | Attending: Emergency Medicine | Admitting: Emergency Medicine

## 2021-11-17 VITALS — BP 106/75 | HR 72 | Temp 98.6°F | Resp 20

## 2021-11-17 DIAGNOSIS — J329 Chronic sinusitis, unspecified: Secondary | ICD-10-CM | POA: Diagnosis not present

## 2021-11-17 DIAGNOSIS — J4521 Mild intermittent asthma with (acute) exacerbation: Secondary | ICD-10-CM | POA: Diagnosis not present

## 2021-11-17 DIAGNOSIS — J310 Chronic rhinitis: Secondary | ICD-10-CM

## 2021-11-17 MED ORDER — FLUTICASONE PROPIONATE 50 MCG/ACT NA SUSP: 1.0000 | Freq: Every day | NASAL | 2 refills | Status: DC

## 2021-11-17 MED ORDER — CETIRIZINE HCL 10 MG PO TABS: 10.0000 mg | ORAL_TABLET | Freq: Every day | ORAL | 1 refills | Status: DC

## 2021-11-17 MED ORDER — ALBUTEROL SULFATE HFA 108 (90 BASE) MCG/ACT IN AERS: 2.0000 | INHALATION_SPRAY | Freq: Four times a day (QID) | RESPIRATORY_TRACT | 0 refills | Status: DC | PRN

## 2021-11-17 MED ORDER — TRELEGY ELLIPTA 100-62.5-25 MCG/ACT IN AEPB: 1.0000 | INHALATION_SPRAY | Freq: Every morning | RESPIRATORY_TRACT | 0 refills | Status: AC

## 2021-11-17 NOTE — ED Provider Notes (Signed)
[facility]    CSN: [id number] Arrival date & time: 11/17/21  1753    HISTORY  No chief complaint on file.  HPI Kaitlyn Luna is a 31 y.o. female. Pt presents to urgent care c/o 3-day history of cough and chest congestion. The patient states her mucus is green and she has been taking OTC medications with no relief.  Patient has completely normal vital signs on arrival today and is in no acute distress.  Patient reports a history of asthma and allergies, not currently taking medication for either.  Patient states has been taking Alka-Seltzer cold, Mucinex, Robitussin with temporary relief of symptoms but symptoms returned once these wear off.  The history is provided by the patient.   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   Right ankle pain 04/19/2017   Morbid obesity (HCC) 02/01/2017   PTSD (post-traumatic stress disorder) 07/26/2013   MDD (major depressive disorder), recurrent severe, 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 Authorizing Provider  diclofenac Sodium (VOLTAREN) 1 % GEL Apply 4 g topically 4 (four) times daily. 08/20/21   Theadora Rama Scales, PA-C  Melatonin 5 MG TABS Take 5 mg by mouth at bedtime.    [provider]   Family History Family History  Problem Relation Age of Onset   Diabetes Father    Social History Social History   Tobacco Use   Smoking status: Former    Types: Cigarettes    Quit date: 12/07/2015    Years since quitting: 5.9   Smokeless tobacco: Never  Vaping Use   Vaping Use: Never used  Substance Use Topics   Alcohol use: Not Currently    Comment: occ   Drug use: No   Allergies   Lavender oil  Review of Systems Review of Systems Pertinent findings noted in history of present illness.   Physical Exam Triage Vital Signs ED Triage Vitals  Enc Vitals Group     BP 03/07/21 0827 (!) 147/82     Pulse Rate 03/07/21 0827  72     Resp 03/07/21 0827 18     Temp 03/07/21 0827 98.3 F (36.8 C)     Temp Source 03/07/21 0827 Oral     SpO2 03/07/21 0827 98 %     Weight --      Height --      Head Circumference --      Peak Flow --      Pain Score 03/07/21 0826 5     Pain Loc --      Pain Edu? --      Excl. in GC? --   No data found.  Updated Vital Signs There were no vitals taken for this visit.  Physical Exam Vitals and nursing note reviewed.  Constitutional:      General: She is not in acute distress.    Appearance: Normal appearance. She is not ill-appearing.  HENT:     Head: Normocephalic and atraumatic.     Salivary Glands: Right salivary gland is not diffusely enlarged or tender. Left salivary gland is not diffusely enlarged or tender.     Right Ear: Ear canal and external ear normal. No drainage. A middle ear effusion is present. There is no impacted cerumen. Tympanic membrane is bulging. Tympanic membrane is not  injected or erythematous.     Left Ear: Ear canal and external ear normal. No drainage. A middle ear effusion is present. There is no impacted cerumen. Tympanic membrane is bulging. Tympanic membrane is not injected or erythematous.     Ears:     Comments: Bilateral EACs normal, both TMs bulging with clear fluid    Nose: Rhinorrhea present. No nasal deformity, septal deviation, signs of injury, nasal tenderness, mucosal edema or congestion. Rhinorrhea is clear.     Right Nostril: Occlusion present. No foreign body, epistaxis or septal hematoma.     Left Nostril: Occlusion present. No foreign body, epistaxis or septal hematoma.     Right Turbinates: Enlarged, swollen and pale.     Left Turbinates: Enlarged, swollen and pale.     Right Sinus: No maxillary sinus tenderness or frontal sinus tenderness.     Left Sinus: No maxillary sinus tenderness or frontal sinus tenderness.     Mouth/Throat:     Lips: Pink. No lesions.     Mouth: Mucous membranes are moist. No oral lesions.     Pharynx: Oropharynx is clear. Uvula midline. No posterior oropharyngeal erythema or uvula swelling.     Tonsils: No tonsillar exudate. 0 on the right. 0 on the left.     Comments: Postnasal drip Eyes:     General: Lids are normal.        Right eye: No discharge.        Left eye: No discharge.     Extraocular Movements: Extraocular movements intact.     Conjunctiva/sclera: Conjunctivae normal.     Right eye: Right conjunctiva is not injected.     Left eye: Left conjunctiva is not injected.  Neck:     Trachea: Trachea and phonation normal.  Cardiovascular:     Rate and Rhythm: Normal rate and regular rhythm.     Pulses: Normal pulses.     Heart sounds: Normal heart sounds. No murmur heard.    No friction rub. No gallop.  Pulmonary:     Effort: Pulmonary effort is normal. No tachypnea, bradypnea, accessory muscle usage, prolonged expiration, respiratory distress or retractions.     Breath sounds: Normal air entry.  No stridor, decreased air  movement or transmitted upper airway sounds. Examination of the right-lower field reveals wheezing. Examination of the left-lower field reveals wheezing. Wheezing present. No decreased breath sounds, rhonchi or rales.     Comments: Bronchospasm with cough Chest:     Chest wall: No tenderness.  Musculoskeletal:        General: Normal range of motion.     Cervical back: Full passive range of motion without pain, normal range of motion and neck supple. Normal range of motion.  Lymphadenopathy:     Cervical: No cervical adenopathy.  Skin:    General: Skin is warm and dry.     Findings: No erythema or rash.  Neurological:     General: No focal deficit present.     Mental Status: She is alert and oriented to person, place, and time.  Psychiatric:        Mood and Affect: Mood normal.        Behavior: Behavior normal.     Visual Acuity Right Eye Distance:   Left Eye Distance:   Bilateral Distance:    Right Eye Near:   Left Eye Near:    Bilateral Near:     UC Couse / Diagnostics / Procedures:    EKG  Radiology No results found.  Procedures Procedures (including critical care time)  UC Diagnoses / Final Clinical Impressions(s)   I have reviewed the triage vital signs and the nursing notes.  Pertinent labs & imaging results that were available during my care of the patient were reviewed by me and considered in my medical decision making (see chart for details).   Final diagnoses:  None   ***  ED Prescriptions   None    PDMP not reviewed this encounter.  Pending results:  Labs Reviewed - No data to display  Medications Ordered in UC: Medications - No data to display  Disposition Upon Discharge:  Condition: stable for discharge home Home: take medications as prescribed; routine discharge instructions as discussed; follow up as advised.  Patient presented with an acute illness with associated systemic symptoms and significant discomfort  requiring urgent management. In my opinion, this is a condition that a prudent lay person (someone who possesses an average knowledge of health and medicine) may potentially expect to result in complications if not addressed urgently such as respiratory distress, impairment of bodily function or dysfunction of bodily organs.   Routine symptom specific, illness specific and/or disease specific instructions were discussed with the patient and/or caregiver at length.   As such, the patient has been evaluated and assessed, work-up was performed and treatment was provided in alignment with urgent care protocols and evidence based medicine.  Patient/parent/caregiver has been advised that the patient may require follow up for further testing and treatment if the symptoms continue in spite of treatment, as clinically indicated and appropriate.  If the patient was tested for COVID-19, Influenza and/or RSV, then the patient/parent/guardian was advised to isolate at home pending the results of his/her diagnostic coronavirus test and potentially longer if they're positive. I have also advised pt that if his/her COVID-19 test returns positive, it's recommended to self-isolate for at least 10 days after symptoms first appeared AND until fever-free for 24 hours without fever reducer AND other symptoms have improved or resolved. Discussed self-isolation recommendations as well as instructions for household member/close contacts as per the [facility] and [place] DHHS, and also gave patient the COVID packet with this information.  Patient/parent/caregiver has been advised to return to the [facility] or  PCP in 3-5 days if no better; to PCP or the Emergency Department if new signs and symptoms develop, or if the current signs or symptoms continue to change or worsen for further workup, evaluation and treatment as clinically indicated and appropriate  The patient will follow up with their current PCP if and as advised. If the patient does not  currently have a PCP we will assist them in obtaining one.   The patient may need specialty follow up if the symptoms continue, in spite of conservative treatment and management, for further workup, evaluation, consultation and treatment as clinically indicated and appropriate.  Patient/parent/caregiver verbalized understanding and agreement of plan as discussed.  All questions were addressed during visit.  Please see discharge instructions below for further details of plan.  Discharge Instructions: Discharge Instructions   None     This office note has been dictated using Dragon speech recognition software.  Unfortunately, and despite my best efforts, this method of dictation can sometimes lead to occasional typographical or grammatical errors.  I apologize in advance if this occurs.

## 2021-11-17 NOTE — ED Triage Notes (Signed)
Pt c/o cough and chest congestion. The patient states her mucus is green and she has been taking OTC medications with no relief. Started: Friday

## 2021-11-17 NOTE — Discharge Instructions (Addendum)
Your symptoms and physical exam findings are concerning for a viral respiratory infection.  Please see the list below for recommended medications, dosages and frequencies to provide relief of your current symptoms:    Zyrtec (cetirizine): This is an excellent second-generation antihistamine that helps to reduce respiratory inflammatory response to environmental allergens.  In some patients, this medication can cause daytime sleepiness so I recommend that you take 1 tablet daily at bedtime.      Flonase (fluticasone): This is a steroid nasal spray that you use once daily, 1 spray in each nare.  This medication does not work well if you decide to use it only used as you feel you need to, it works best used on a daily basis.  After 3 to 5 days of use, you will notice significant reduction of the inflammation and mucus production that is currently being caused by exposure to allergens, whether seasonal or environmental.  The most common side effect of this medication is nosebleeds.  If you experience a nosebleed, please discontinue use for 1 week, then feel free to resume.  I have provided you with a prescription.     ProAir, Ventolin, Proventil (albuterol): This inhaled medication contains a short acting beta agonist bronchodilator.  This medication works on the smooth muscle that opens and constricts of your airways by relaxing the muscle.  The result of relaxation of the smooth muscle is increased air movement and improved work of breathing.  This is a short acting medication that can be used every 4-6 hours as needed for increased work of breathing, shortness of breath, wheezing and excessive coughing.  I have provided you with a prescription.    Trelegy (fluticasone, vilanterol and umeclidinium):  This inhaled medication contains a corticosteroid and long-acting form of albuterol.  The inhaled steroid and this medication  is not absorbed into the body and will not cause side effects such as increased blood  sugar levels, irritability, sleeplessness or weight gain.  Inhaled corticosteroid are sort of like topical steroid creams but, as you can imagine, it is not practical to attempt to rub a steroid cream inside of your lungs.  The long-acting albuterol works similarly to the short acting albuterol found in your rescue inhaler but provides 24-hour relaxation of the smooth muscles that open and constrict your airways; your short acting rescue inhaler can only provide for a few hours this benefit for a few hours.  The third unique ingredient, umeclidinium, is an antimuscarinic and works similarly to your albuterol, providing long-acting relaxation of the smooth muscles in your airway.  Please feel free to continue using your short acting rescue inhaler as often as needed throughout the day for shortness of breath, wheezing, and cough.   Please follow-up with either your primary care provider or with urgent care for repeat evaluation you of your lungs in the next 3 to 4 days to ensure that you are improving and also to evaluate whether or not your treatment regimen needs to be adjusted.   Thank you for visiting urgent care today.  We appreciate the opportunity to participate in your care.

## 2021-11-18 ENCOUNTER — Telehealth: Payer: Self-pay

## 2021-11-18 ENCOUNTER — Telehealth: Payer: Self-pay | Admitting: Emergency Medicine

## 2021-11-18 MED ORDER — LEVALBUTEROL TARTRATE 45 MCG/ACT IN AERO: 2.0000 | INHALATION_SPRAY | RESPIRATORY_TRACT | 12 refills | Status: DC | PRN

## 2021-11-18 NOTE — Telephone Encounter (Signed)
Patient verification complete (name and date of birth).  Patient called wanting to get her albuterol prescriptions changed due to insurance coverage. Patient made aware that new prescription was sent. All questions answered.

## 2021-11-18 NOTE — Telephone Encounter (Signed)
Albuterol changed to levalbuterol due to patient's insurance restrictions.

## 2021-11-21 ENCOUNTER — Ambulatory Visit
Admission: RE | Admit: 2021-11-21 | Discharge: 2021-11-21 | Disposition: A | Discharge status: Home / Self Care | Payer: BC Managed Care – PPO | Source: Ambulatory Visit | Attending: Emergency Medicine | Admitting: Emergency Medicine

## 2021-11-21 VITALS — BP 114/71 | HR 77 | Temp 99.1°F | Resp 20

## 2021-11-21 DIAGNOSIS — J4521 Mild intermittent asthma with (acute) exacerbation: Secondary | ICD-10-CM

## 2021-11-21 MED ORDER — METHYLPREDNISOLONE 4 MG PO TBPK: ORAL_TABLET | ORAL | 0 refills | Status: DC

## 2021-11-21 NOTE — Discharge Instructions (Signed)
Please continue all of your previous medications as prescribed.  Tomorrow morning, please begin taking methylprednisolone.  Please take 1 full row of tablets all at once with your breakfast meal starting tomorrow morning and repeat daily until prescription is complete.  Thank you for visiting urgent care today.

## 2021-11-21 NOTE — ED Provider Notes (Signed)
[facility]    CSN: [id number] Arrival date & time: 11/21/21  1747    HISTORY   Chief Complaint  Patient presents with   Cough   Nasal Congestion   HPI Kaitlyn Luna is a pleasant, 31 y.o. female who presents to urgent care today complaining of Cough that is not any better.  Patient was seen 4 days ago for exacerbation of mild intermittent asthma.  Patient was provided with prescriptions for albuterol and Trelegy as well as Zyrtec and Flonase.  The history is provided by the patient.   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   Right ankle pain 04/19/2017   Morbid obesity (HCC) 02/01/2017   PTSD (post-traumatic stress disorder) 07/26/2013   MDD (major depressive disorder), recurrent severe, 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   Past Surgical History:  Procedure Laterality Date   BREAST SURGERY     REDUCTI0N   CERVICAL CONIZATION W/BX N/A 05/27/2017   Procedure: CONIZATION CERVIX WITH BIOPSY - COLD KNIFE;  Surgeon: Allie Bossier, MD;  Location: [facility];  Service: Gynecology;  Laterality: N/A;   KNEE ARTHROSCOPY Left    TONSILLECTOMY     OB History     Gravida  0   Para  0   Term  0   Preterm  0   AB  0   Living  0      SAB  0   IAB  0   Ectopic  0   Multiple  0   Live Births             Home Medications    Prior to Admission medications   Medication Sig Start Date End Date Taking? Authorizing Provider  cetirizine (ZYRTEC ALLERGY) 10 MG tablet Take  1 tablet (10 mg total) by mouth at bedtime. 11/17/21 05/16/22  Theadora Rama Scales, PA-C  diclofenac Sodium (VOLTAREN) 1 % GEL Apply 4 g topically 4 (four) times daily. 08/20/21   Theadora Rama Scales, PA-C  fluticasone (FLONASE) 50 MCG/ACT nasal spray Place 1 spray into both nostrils daily. Begin by using 2 sprays in each nare daily for 3 to 5 days, then decrease to 1 spray in each nare daily. 11/17/21   Theadora Rama Scales, PA-C  Fluticasone-Umeclidin-Vilant (TRELEGY ELLIPTA) 100-62.5-25 MCG/ACT AEPB Inhale 1 puff into the lungs in the morning for 14 days. 11/17/21 12/01/21  Theadora Rama Scales, PA-C  levalbuterol [facility] HFA) 45 MCG/ACT inhaler Inhale 2 puffs into the lungs every 4 (four) hours as needed for wheezing. 11/18/21   Theadora Rama Scales, PA-C  Melatonin 5 MG TABS Take 5 mg by mouth at bedtime.    [provider]    Family History Family History  Problem Relation Age of Onset   Diabetes Father    Social History Social History   Tobacco Use   Smoking status: Former    Types: Cigarettes    Quit date: 12/07/2015  Years since quitting: 5.9   Smokeless tobacco: Never  Vaping Use   Vaping Use: Never used  Substance Use Topics   Alcohol use: Not Currently    Comment: occ   Drug use: No   Allergies   Lavender oil  Review of Systems Review of Systems Pertinent findings revealed after performing a 14 point review of systems has been noted in the history of present illness.  Physical Exam Triage Vital Signs ED Triage Vitals  Enc Vitals Group     BP 03/07/21 0827 (!) 147/82     Pulse Rate 03/07/21 0827 72     Resp 03/07/21 0827 18     Temp 03/07/21 0827 98.3 F (36.8 C)     Temp Source 03/07/21 0827 Oral     SpO2 03/07/21 0827 98 %     Weight --      Height --      Head Circumference --      Peak Flow --      Pain Score 03/07/21 0826 5     Pain Loc --      Pain Edu? --      Excl. in GC? --   No data found.  Updated Vital Signs BP 114/71 (BP  Location: Left Arm)   Pulse 77   Temp 99.1 F (37.3 C) (Oral)   Resp 20   SpO2 96%   Physical Exam Vitals and nursing note reviewed.  Constitutional:      General: She is not in acute distress.    Appearance: Normal appearance. She is not ill-appearing.  HENT:     Head: Normocephalic and atraumatic.     Salivary Glands: Right salivary gland is not diffusely enlarged or tender. Left salivary gland is not diffusely enlarged or tender.     Right Ear: Ear canal and external ear normal. No drainage. No middle ear effusion. There is no impacted cerumen. Tympanic membrane is bulging. Tympanic membrane is not injected or erythematous.     Left Ear: Ear canal and external ear normal. No drainage.  No middle ear effusion. There is no impacted cerumen. Tympanic membrane is bulging. Tympanic membrane is not injected or erythematous.     Ears:     Comments: Bilateral EACs normal, both TMs bulging with clear fluid    Nose: No nasal deformity, septal deviation, signs of injury, nasal tenderness, mucosal edema, congestion or rhinorrhea.     Right Nostril: Occlusion present. No foreign body, epistaxis or septal hematoma.     Left Nostril: Occlusion present. No foreign body, epistaxis or septal hematoma.     Right Turbinates: Enlarged, swollen and pale.     Left Turbinates: Enlarged, swollen and pale.     Right Sinus: No maxillary sinus tenderness or frontal sinus tenderness.     Left Sinus: No maxillary sinus tenderness or frontal sinus tenderness.     Mouth/Throat:     Lips: Pink. No lesions.     Mouth: Mucous membranes are moist. No oral lesions.     Pharynx: Oropharynx is clear. Uvula midline. No posterior oropharyngeal erythema or uvula swelling.     Tonsils: No tonsillar exudate. 0 on the right. 0 on the left.     Comments: Postnasal drip Eyes:     General: Lids are normal.        Right eye: No discharge.        Left eye: No discharge.     Extraocular Movements: Extraocular movements intact.  Conjunctiva/sclera: Conjunctivae normal.     Right eye: Right conjunctiva is not injected.     Left eye: Left conjunctiva is not injected.  Neck:     Trachea: Trachea and phonation normal.  Cardiovascular:     Rate and Rhythm: Normal rate and regular rhythm.     Pulses: Normal pulses.     Heart sounds: Normal heart sounds. No murmur heard.    No friction rub. No gallop.  Pulmonary:     Effort: Pulmonary effort is normal. No tachypnea, bradypnea, accessory muscle usage, prolonged expiration, respiratory distress or retractions.     Breath sounds: Normal air entry. No stridor, decreased air movement or transmitted upper airway sounds. No decreased breath sounds, wheezing, rhonchi or rales.     Comments: Bronchospasm with cough Chest:     Chest wall: No tenderness.  Musculoskeletal:        General: Normal range of motion.     Cervical back: Full passive range of motion without pain, normal range of motion and neck supple. Normal range of motion.  Lymphadenopathy:     Cervical: No cervical adenopathy.  Skin:    General: Skin is warm and dry.     Findings: No erythema or rash.  Neurological:     General: No focal deficit present.     Mental Status: She is alert and oriented to person, place, and time.  Psychiatric:        Mood and Affect: Mood normal.        Behavior: Behavior normal.     Visual Acuity Right Eye Distance:   Left Eye Distance:   Bilateral Distance:    Right Eye Near:   Left Eye Near:    Bilateral Near:     UC Couse / Diagnostics / Procedures:     Radiology No results found.  Procedures Procedures (including critical care time) EKG  Pending results:  Labs Reviewed - No data to display  Medications Ordered in UC: Medications - No data to display  UC Diagnoses / Final Clinical Impressions(s)   I have reviewed the triage vital signs and the nursing notes.  Pertinent labs & imaging results that were available during my care of the patient were  reviewed by me and considered in my medical decision making (see chart for details).    Final diagnoses:  Mild intermittent asthma with acute exacerbation   Patient has been using allergy medications and inhaled asthma medications for 4 days.  Patient reports no improvement of symptoms however her oxygenation has improved and physical exam demonstrates resolution of wheezing.  Patient forcefully coughing frequently throughout visit.  Patient provided with methylprednisolone advised to continue all medications exactly as they have been prescribed.  Patient advised to follow-up with primary care if no better in 1 week.  ED Prescriptions     Medication Sig Dispense Auth. Provider   methylPREDNISolone (MEDROL DOSEPAK) 4 MG TBPK tablet Take 24 mg on day 1, 20 mg on day 2, 16 mg on day 3, 12 mg on day 4, 8 mg on day 5, 4 mg on day 6.  Take all tablets in each row at once, do not spread tablets out throughout the day. 21 tablet Theadora Rama Scales, PA-C      PDMP not reviewed this encounter.  Disposition Upon Discharge:  Condition: stable for discharge home Home: take medications as prescribed; routine discharge instructions as discussed; follow up as advised.  Patient presented with an acute illness with associated systemic symptoms and  significant discomfort requiring urgent management. In my opinion, this is a condition that a prudent lay person (someone who possesses an average knowledge of health and medicine) may potentially expect to result in complications if not addressed urgently such as respiratory distress, impairment of bodily function or dysfunction of bodily organs.   Routine symptom specific, illness specific and/or disease specific instructions were discussed with the patient and/or caregiver at length.   As such, the patient has been evaluated and assessed, work-up was performed and treatment was provided in alignment with urgent care protocols and evidence based medicine.   Patient/parent/caregiver has been advised that the patient may require follow up for further testing and treatment if the symptoms continue in spite of treatment, as clinically indicated and appropriate.  If the patient was tested for COVID-19, Influenza and/or RSV, then the patient/parent/guardian was advised to isolate at home pending the results of his/her diagnostic coronavirus test and potentially longer if they're positive. I have also advised pt that if his/her COVID-19 test returns positive, it's recommended to self-isolate for at least 10 days after symptoms first appeared AND until fever-free for 24 hours without fever reducer AND other symptoms have improved or resolved. Discussed self-isolation recommendations as well as instructions for household member/close contacts as per the [facility] and [place] DHHS, and also gave patient the COVID packet with this information.  Patient/parent/caregiver has been advised to return to the [facility] or PCP in 3-5 days if no better; to PCP or the Emergency Department if new signs and symptoms develop, or if the current signs or symptoms continue to change or worsen for further workup, evaluation and treatment as clinically indicated and appropriate  The patient will follow up with their current PCP if and as advised. If the patient does not currently have a PCP we will assist them in obtaining one.   The patient may need specialty follow up if the symptoms continue, in spite of conservative treatment and management, for further workup, evaluation, consultation and treatment as clinically indicated and appropriate.  Patient/parent/caregiver verbalized understanding and agreement of plan as discussed.  All questions were addressed during visit.  Please see discharge instructions below for further details of plan.  Discharge Instructions:   Discharge Instructions      Please continue all of your previous medications as prescribed.  Tomorrow morning, please begin  taking methylprednisolone.  Please take 1 full row of tablets all at once with your breakfast meal starting tomorrow morning and repeat daily until prescription is complete.  Thank you for visiting urgent care today.      This office note has been dictated using Teaching laboratory technician.  Unfortunately, this method of dictation can sometimes lead to typographical or grammatical errors.  I apologize for your inconvenience in advance if this occurs.  Please do not hesitate to reach out to me if clarification is needed.      Theadora Rama Scales, PA-C 11/21/21 1936

## 2021-11-21 NOTE — ED Triage Notes (Signed)
Pt c/o worsening cough and SOB, the patient states her inhaler is not helping.

## 2022-01-13 ENCOUNTER — Ambulatory Visit: Payer: BC Managed Care – PPO | Admitting: Physician Assistant

## 2022-03-09 ENCOUNTER — Other Ambulatory Visit: Payer: Self-pay

## 2022-03-09 ENCOUNTER — Ambulatory Visit (INDEPENDENT_AMBULATORY_CARE_PROVIDER_SITE_OTHER): Payer: BC Managed Care – PPO | Admitting: Obstetrics and Gynecology

## 2022-03-09 ENCOUNTER — Encounter: Payer: Self-pay | Admitting: Obstetrics and Gynecology

## 2022-03-09 VITALS — BP 115/57 | HR 77 | Wt 359.7 lb

## 2022-03-09 DIAGNOSIS — R635 Abnormal weight gain: Secondary | ICD-10-CM | POA: Diagnosis not present

## 2022-03-09 DIAGNOSIS — L680 Hirsutism: Secondary | ICD-10-CM | POA: Diagnosis not present

## 2022-03-09 NOTE — Progress Notes (Signed)
   GYNECOLOGY OFFICE VISIT NOTE  History:   Kaitlyn Luna is a 31 y.o. G0P0000 here today for hirsutism and inability of lose weight. Pt states she has been on hormonal birth control since she was 31 y.o. She reports history of irregular periods every 3-4 months. She is currently having a period monthly. She reports facial hair growth in which she has to shave regularly and difficulty losing weight. Pt is concerned for PCOS. Pt was seen recently by Dr. Jodi Mourning for same with normal TSH, A1c, testosterone panel, and pelvis US. Pt was referred to [facility] and Endocrinology and has not follow up with them.  She denies any abnormal vaginal discharge, bleeding, pelvic pain or other concerns.    Past Medical History:  Diagnosis Date   Anemia    Anxiety    Asthma    Bipolar depression ([place])    Chronic kidney disease    UTI AND FREQUENT KIDNEY INFECTIONS FREQUENTLY   Depression    Pneumonia 2017   PTSD (post-traumatic stress disorder)    Sleep apnea    not using c-pap    Past Surgical History:  Procedure Laterality Date   BREAST SURGERY     REDUCTI0N   CERVICAL CONIZATION W/BX N/A 05/27/2017   Procedure: CONIZATION CERVIX WITH BIOPSY - COLD KNIFE;  Surgeon: Emily Filbert, MD;  Location: [place] [facility];  Service: Gynecology;  Laterality: N/A;   KNEE ARTHROSCOPY Left    TONSILLECTOMY      The following portions of the patient's history were reviewed and updated as appropriate: allergies, current medications, past family history, past medical history, past social history, past surgical history and problem list.   Health Maintenance:   Normal pap and negative HRHPV on 05/28/2021.   Diagnosis  Date Value Ref Range Status  05/28/2021   Final   - Negative for intraepithelial lesion or malignancy (NILM)    Normal mammogram on N/A.   Review of Systems:  Pertinent items noted in HPI and remainder of comprehensive ROS otherwise negative.  Physical Exam:  BP (!) 115/57   Pulse 77   Wt (!) 359 lb  11.2 oz (163.2 kg)   LMP 03/09/2022   BMI 56.34 kg/m  CONSTITUTIONAL: Well-developed, well-nourished female in no acute distress.  HEENT:  Normocephalic, atraumatic. External right and left ear normal. No scleral icterus.  NECK: Normal range of motion, supple, no masses noted on observation SKIN: No rash noted. Not diaphoretic. No erythema. No pallor. MUSCULOSKELETAL: Normal range of motion. No edema noted. NEUROLOGIC: Alert and oriented to person, place, and time. Normal muscle tone coordination. No cranial nerve deficit noted. PSYCHIATRIC: Normal mood and affect. Normal behavior. Normal judgment and thought content.  PELVIC: Deferred  Labs and Imaging No results found for this or any previous visit (from the past 168 hour(s)). No results found.  Assessment and Plan:   There are no diagnoses linked to this encounter.  Hirsutism Pt reassured that she does not meet diagnosis of PCOS. Encouraged patient to follow up with Endocrinology as previously recommended. Nml TSH, Testosterone panel, and US pelvic.   Wt. Loss Normal A1c. Referred to [facility].   There are no diagnoses linked to this encounter.  Routine preventative health maintenance measures emphasized. Please refer to After Visit Summary for other counseling recommendations.   Return in about 9 months (around 12/10/2022) for nexplanon and discussion of options.  [place] Student

## 2022-03-09 NOTE — Progress Notes (Signed)
GYNECOLOGY OFFICE VISIT NOTE  History:   Kaitlyn Luna is a 31 y.o. G0P0000 here today for reviewed of possible PCOS or discussion of whatever may be causing her symptoms.   She has had ongoing hirsuitism that is severe enough she has to shave. She also reports not only inability to lose weight despite efforts. She avoids fad diets, eats leans meats and tries to limit sweets. She has not yet seen PCP but is not interested in doing the ozempic type medications. She   She has been on birth control since she was 9 because her periods were very heavy. She first did OCPs but has done the Nexplanon through several iterations. She thinks the next one is due out in August 2024.   She saw Dr. Clearance Coots in Jan/Feb and had normal free testosterone, TSH, A1C and pelvic US.   She denies any abnormal vaginal discharge, bleeding, pelvic pain or other concerns.     Past Medical History:  Diagnosis Date   Anemia    Anxiety    Asthma    Bipolar depression (HCC)    Chronic kidney disease    UTI AND FREQUENT KIDNEY INFECTIONS FREQUENTLY   Depression    Pneumonia 2017   PTSD (post-traumatic stress disorder)    Sleep apnea    not using c-pap    Past Surgical History:  Procedure Laterality Date   BREAST SURGERY     REDUCTI0N   CERVICAL CONIZATION W/BX N/A 05/27/2017   Procedure: CONIZATION CERVIX WITH BIOPSY - COLD KNIFE;  Surgeon: Allie Bossier, MD;  Location: [facility];  Service: Gynecology;  Laterality: N/A;   KNEE ARTHROSCOPY Left    TONSILLECTOMY      The following portions of the patient's history were reviewed and updated as appropriate: allergies, current medications, past family history, past medical history, past social history, past surgical history and problem list.   Health Maintenance:   Normal pap and negative HRHPV:   Diagnosis  Date Value Ref Range Status  05/28/2021   Final   - Negative for intraepithelial lesion or malignancy (NILM)   Review of Systems:  Pertinent items  noted in HPI and remainder of comprehensive ROS otherwise negative.  Physical Exam:  BP (!) 115/57   Pulse 77   Wt (!) 359 lb 11.2 oz (163.2 kg)   LMP 03/09/2022   BMI 56.34 kg/m  CONSTITUTIONAL: Well-developed, well-nourished female in no acute distress.  HEENT:  Normocephalic, atraumatic. External right and left ear normal. No scleral icterus.  NECK: Normal range of motion, supple, no masses noted on observation SKIN: No rash noted. Not diaphoretic. No erythema. No pallor. MUSCULOSKELETAL: Normal range of motion. No edema noted. NEUROLOGIC: Alert and oriented to person, place, and time. Normal muscle tone coordination. No cranial nerve deficit noted. PSYCHIATRIC: Normal mood and affect. Normal behavior. Normal judgment and thought content.  CARDIOVASCULAR: Normal heart rate noted RESPIRATORY: Effort and breath sounds normal, no problems with respiration noted ABDOMEN: No masses noted. No other overt distention noted.    PELVIC: Deferred  Labs and Imaging No results found for this or any previous visit (from the past 168 hour(s)). No results found.  Assessment and Plan:   1. Weight gain Similar to below, could be related to Nexplanon but would be odd since she has used them for many years. She may want Nexplanon out and would consider a hysterectomy, but I would not advise this at this time due to the fact that it would not fix  her hirsutism or weight gain.  - Amb Ref to Medical Weight Management - Ambulatory referral to Endocrinology  2. Hirsutism Reviewed diagnostic criteria for PCOS: Oligomenorrhea (has no history of this prior to birth control, but also has irregular bleeding on Nexplanon which is normal), clinical or lab signs of hyperandrogenism (she mets clinical but labs were normal), and cysts on the ovaries. She has 1/3 of these at this time. Reviewed many other causes for hirsuitism and evaluation by endo would be valuable. Discussed could be Nexplanon but unlikely since  this is a newer problem.  - If workup with endo is negative, she can return and we can remove nexplanon and discuss other options at that time.  - Ambulatory referral to Endocrinology  Routine preventative health maintenance measures emphasized. Please refer to After Visit Summary for other counseling recommendations.   Return in about 9 months (around 12/10/2022) for nexplanon and discussion of options.  Radene Gunning, MD, [place] for [facility], [place]

## 2022-03-22 ENCOUNTER — Emergency Department (HOSPITAL_COMMUNITY): Payer: BC Managed Care – PPO

## 2022-03-22 ENCOUNTER — Encounter (HOSPITAL_COMMUNITY): Payer: Self-pay | Admitting: Pharmacy Technician

## 2022-03-22 ENCOUNTER — Emergency Department (HOSPITAL_COMMUNITY)
Admission: EM | Admit: 2022-03-22 | Discharge: 2022-03-23 | Discharge status: Against Medical Advice | Payer: BC Managed Care – PPO | Attending: Physician Assistant | Admitting: Physician Assistant

## 2022-03-22 DIAGNOSIS — R059 Cough, unspecified: Secondary | ICD-10-CM | POA: Diagnosis not present

## 2022-03-22 DIAGNOSIS — R42 Dizziness and giddiness: Secondary | ICD-10-CM | POA: Insufficient documentation

## 2022-03-22 DIAGNOSIS — U071 COVID-19: Secondary | ICD-10-CM | POA: Insufficient documentation

## 2022-03-22 DIAGNOSIS — Z5321 Procedure and treatment not carried out due to patient leaving prior to being seen by health care provider: Secondary | ICD-10-CM | POA: Diagnosis not present

## 2022-03-22 DIAGNOSIS — R519 Headache, unspecified: Secondary | ICD-10-CM | POA: Insufficient documentation

## 2022-03-22 DIAGNOSIS — R0981 Nasal congestion: Secondary | ICD-10-CM | POA: Diagnosis present

## 2022-03-22 LAB — CBC WITH DIFFERENTIAL/PLATELET
Abs Immature Granulocytes: 0.03 10*3/uL (ref 0.00–0.07)
Basophils Absolute: 0.1 10*3/uL (ref 0.0–0.1)
Basophils Relative: 1 %
Eosinophils Absolute: 0.3 10*3/uL (ref 0.0–0.5)
Eosinophils Relative: 5 %
HCT: 43.2 % (ref 36.0–46.0)
Hemoglobin: 14.4 g/dL (ref 12.0–15.0)
Immature Granulocytes: 0 %
Lymphocytes Relative: 26 %
Lymphs Abs: 1.8 10*3/uL (ref 0.7–4.0)
MCH: 29.3 pg (ref 26.0–34.0)
MCHC: 33.3 g/dL (ref 30.0–36.0)
MCV: 88 fL (ref 80.0–100.0)
Monocytes Absolute: 1 10*3/uL (ref 0.1–1.0)
Monocytes Relative: 15 %
Neutro Abs: 3.6 10*3/uL (ref 1.7–7.7)
Neutrophils Relative %: 53 %
Platelets: 320 10*3/uL (ref 150–400)
RBC: 4.91 MIL/uL (ref 3.87–5.11)
RDW: 12.3 % (ref 11.5–15.5)
WBC: 6.8 10*3/uL (ref 4.0–10.5)
nRBC: 0 % (ref 0.0–0.2)

## 2022-03-22 LAB — URINALYSIS, ROUTINE W REFLEX MICROSCOPIC
Bilirubin Urine: NEGATIVE
Glucose, UA: NEGATIVE mg/dL
Hgb urine dipstick: NEGATIVE
Ketones, ur: NEGATIVE mg/dL
Leukocytes,Ua: NEGATIVE
Nitrite: NEGATIVE
Protein, ur: NEGATIVE mg/dL
Specific Gravity, Urine: 1.016 (ref 1.005–1.030)
pH: 5 (ref 5.0–8.0)

## 2022-03-22 LAB — COMPREHENSIVE METABOLIC PANEL
ALT: 18 U/L (ref 0–44)
AST: 14 U/L — ABNORMAL LOW (ref 15–41)
Albumin: 3.8 g/dL (ref 3.5–5.0)
Alkaline Phosphatase: 68 U/L (ref 38–126)
Anion gap: 9 (ref 5–15)
BUN: 7 mg/dL (ref 6–20)
CO2: 20 mmol/L — ABNORMAL LOW (ref 22–32)
Calcium: 8.9 mg/dL (ref 8.9–10.3)
Chloride: 107 mmol/L (ref 98–111)
Creatinine, Ser: 0.71 mg/dL (ref 0.44–1.00)
GFR, Estimated: >60 mL/min (ref >60)
Glucose, Bld: 94 mg/dL (ref 70–99)
Potassium: 4.2 mmol/L (ref 3.5–5.1)
Sodium: 136 mmol/L (ref 135–145)
Total Bilirubin: 0.2 mg/dL — ABNORMAL LOW (ref 0.3–1.2)
Total Protein: 7.2 g/dL (ref 6.5–8.1)

## 2022-03-22 LAB — RESP PANEL BY RT-PCR (FLU A&B, COVID) ARPGX2
Influenza A by PCR: NEGATIVE
Influenza B by PCR: NEGATIVE
SARS Coronavirus 2 by RT PCR: POSITIVE — AB

## 2022-03-22 LAB — LIPASE, BLOOD: Lipase: 38 U/L (ref 11–51)

## 2022-03-22 NOTE — ED Triage Notes (Signed)
Pt bib ems with fever, body aches congestion. Approx 1 hour ago pt started talking with irish accent and that is patients main complaint now. VSS with ems. Pt denies SI or HI. Pt with hx of PTSD and stopped taking medications.

## 2022-03-22 NOTE — ED Provider Triage Note (Signed)
Emergency Medicine Provider Triage Evaluation Note  Kaitlyn Luna , a 31 y.o. female  was evaluated in triage.  Pt complains of flu sx for 3 days.  Has had severe headache and dizziness over the last 3 days with associated congestion and rhinorrhea.  She thought this was due to a "cold."  While she was making food earlier today she noted to have intermittent changes in her voice, speaking in an [place] accent.  No slurred speech, numbness, weakness.  No neck pain.  Headache worse than her typical headache.  Denies any sudden onset thunderclap headache.  States when she tries to speak in her "normal" voice that hurts her throat.    No SI, HI, AVH. Recently stopped medicine for PTSD.  Review of Systems  Positive: HA, dizziness, cough, congestion Negative:   Physical Exam  LMP 03/09/2022  Gen:   Awake, no distress   Resp:  Normal effort  MSK:   Moves extremities without difficulty  Neuro:  CN 2-12 grossly intact, equal handgrip, intact sensation, negative finger-nose, speaking intermittently in an [place] accent Other:    Medical Decision Making  Medically screening exam initiated at 1:22 PM.  Appropriate orders placed.  Kaitlyn Luna was informed that the remainder of the evaluation will be completed by another provider, this initial triage assessment does not replace that evaluation, and the importance of remaining in the ED until their evaluation is complete.   No code stroke, no LVO, will get labs and imaging   Steve Youngberg A, PA-C 03/22/22 1328

## 2022-03-22 NOTE — ED Notes (Signed)
Patient upset over wait time. States she is leaving

## 2022-03-23 ENCOUNTER — Other Ambulatory Visit: Payer: Self-pay

## 2022-03-23 ENCOUNTER — Telehealth: Payer: Self-pay | Admitting: Emergency Medicine

## 2022-03-23 ENCOUNTER — Emergency Department
Admission: EM | Admit: 2022-03-23 | Discharge: 2022-03-23 | Disposition: A | Discharge status: Home / Self Care | Payer: BC Managed Care – PPO | Attending: Emergency Medicine | Admitting: Emergency Medicine

## 2022-03-23 DIAGNOSIS — R519 Headache, unspecified: Secondary | ICD-10-CM | POA: Diagnosis present

## 2022-03-23 DIAGNOSIS — N189 Chronic kidney disease, unspecified: Secondary | ICD-10-CM | POA: Insufficient documentation

## 2022-03-23 DIAGNOSIS — U071 COVID-19: Secondary | ICD-10-CM | POA: Insufficient documentation

## 2022-03-23 DIAGNOSIS — R499 Unspecified voice and resonance disorder: Secondary | ICD-10-CM

## 2022-03-23 DIAGNOSIS — G43809 Other migraine, not intractable, without status migrainosus: Secondary | ICD-10-CM | POA: Diagnosis not present

## 2022-03-23 DIAGNOSIS — J45909 Unspecified asthma, uncomplicated: Secondary | ICD-10-CM | POA: Diagnosis not present

## 2022-03-23 MED ORDER — ACETAMINOPHEN 500 MG PO TABS
1000.0000 mg | ORAL_TABLET | Freq: Once | ORAL | Status: AC
Start: 2022-03-23 — End: 2022-03-23
  Administered 2022-03-23: 1000 mg via ORAL
  Filled 2022-03-23: qty 2

## 2022-03-23 MED ORDER — IBUPROFEN 600 MG PO TABS
600.0000 mg | ORAL_TABLET | Freq: Once | ORAL | Status: AC
  Administered 2022-03-23: 600 mg via ORAL
  Filled 2022-03-23: qty 1

## 2022-03-23 NOTE — Discharge Instructions (Signed)
   Thank you for choosing us for your health care today!  Please see your primary doctor this week for a follow up appointment.   If you do not have a primary doctor call the following clinics to establish care:  If you have insurance:  [facility] [phone] [address] [place] [address]   [facility]  [phone] [address] [place] [address]   If you do not have insurance:  [facility]  [phone] [address] [place] [address]  Sometimes, in the early stages of certain disease courses it is difficult to detect in the emergency department evaluation -- so, it is important that you continue to monitor your symptoms and call your doctor right away or return to the emergency department if you develop any new or worsening symptoms.  It was my pleasure to care for you today.   Breck Hollinger S. Daylan Juhnke, MD  

## 2022-03-23 NOTE — Telephone Encounter (Addendum)
Called patient to inform her of follow up at [facility] Neurology referral made and outpatient MRI brain ordered - she understands these instructions, as well as instructions to return to the emergency department should any new symptoms arise

## 2022-03-23 NOTE — ED Provider Notes (Addendum)
[facility] Provider Note    Event Date/Time   First MD Initiated Contact with Patient 03/23/22 0235     (approximate)   History   Facial Pain, Emesis, and Diarrhea   HPI  Kaitlyn Luna is a 31 y.o. female   Past medical history of anxiety, depression, PTSD, bipolar, migraines, asthma and CKD who presents to the emergency department with viral upper respiratory infectious symptoms including headache, myalgias, cough, nasal congestion for the last several days and a chief complaint of a new [place] accent that she developed yesterday.  She has had a migraine headache for the past 1 week that has worsened.  She was seen at [facility] emergency department earlier tonight and got labs and a CT head which were unremarkable and tested positive for COVID there.  She left due to the long wait and came to [facility].  History was obtained via the patient.      Physical Exam   Triage Vital Signs: ED Triage Vitals  Enc Vitals Group     BP 03/23/22 0024 (!) 138/106     Pulse Rate 03/23/22 0024 (!) 105     Resp 03/23/22 0024 (!) 22     Temp 03/23/22 0024 98.6 F (37 C)     Temp src --      SpO2 03/23/22 0024 99 %     Weight 03/23/22 0021 (!) 361 lb 8.9 oz (164 kg)     Height 03/23/22 0021 5\' 7"  (1.702 m)     Head Circumference --      Peak Flow --      Pain Score 03/23/22 0021 7     Pain Loc --      Pain Edu? --      Excl. in [place]? --     Most recent vital signs: Vitals:   03/23/22 0024 03/23/22 0308  BP: (!) 138/106 (!) 130/91  Pulse: (!) 105 89  Resp: (!) 22 (!) 22  Temp: 98.6 F (37 C)   SpO2: 99% 98%    General: Awake, alert, oriented, nontoxic appearing.  Speak with an [place] sounding accent which she states is new.  She has no focal neurological deficits, gait is steady and normal, no pronator drift, normal strength and sensation, no facial asymmetry, extraocular movements intact and pupils are equal round and reactive she has no  visual field cuts and she has normal finger-to-nose. CV:  Good peripheral perfusion.  Resp:  Normal effort.  Clear to auscultation without wheezing focality or rales. Abd:  No distention.  Nontender. Other:  Neck supple with full range of motion.   ED Results / Procedures / Treatments   Labs (all labs ordered are listed, but only abnormal results are displayed) Labs Reviewed - No data to display   I reviewed labs obtained at [facility] earlier today and they are notable for normal BMP, normal CBC without elevated white count, normal H&H, COVID-positive and negative urinalysis   RADIOLOGY I independently reviewed and interpreted CT scan of the head obtained at [facility] and see no bleeding or midline shift.   PROCEDURES:  Critical Care performed: No  Procedures   MEDICATIONS ORDERED IN ED: Medications  acetaminophen (TYLENOL) tablet 1,000 mg (1,000 mg Oral Given 03/23/22 0302)  ibuprofen (ADVIL) tablet 600 mg (600 mg Oral Given 03/23/22 0303)    IMPRESSION / MDM / ASSESSMENT AND PLAN / ED COURSE  I reviewed the triage vital signs and the nursing notes.  Differential diagnosis includes, but is not limited to, COVID, upper respiratory infection, migraine headache, considered but less likely intracranial bleeding, CVA  MDM: Patient with upper respiratory viral symptoms and COVID-positive testing who has no focality or wheezing on my exam and has no respiratory distress and is not hypoxic who does not complain about her respiratory infectious symptoms but rather her new accent that she developed yesterday.  She has had gradual onset of migraine headache worsening over the last several days in the setting of viral URI with a negative CT of the head so I doubt she has a head bleed or subarachnoid hemorrhage would like to defer an LP at this time given low suspicion of meningitis, encephalitis, subarachnoid hemorrhage and risks of this procedure would  outweigh any benefits.  I offered to treat her headache with IV migraine cocktail but she declines medications through injection and would rather p.o., I will give her Tylenol and ibuprofen.  She has no focal neurological deficits to suspect CVA and her symptom onset was well beyond the thrombolytic window and symptoms are mild and would not be amenable to emergent intervention. Her accent fluctuates and at times is present and at times is normal. May be complex migraine or psychogenic.  I spoke with Dr. Otelia Limes regarding this patient's presentation and he agrees given limited symptoms and time course, there would be no emergent interventions at this time. Agrees that although unlikely, CVA/mass lesions/MS flare can also be on the differential and advised MRI brain w/wo contrast would be appropriate along with outpatient neurology follow up in one week. I ordered an MRI for the patient and spoke with [facility] to arrange follow up and called [name] this morning to inform her of the plan for MRI and clinic f/u and also instructed her to return to the ED should she experience any new or worsening symptoms.    Patient's presentation is most consistent with acute presentation with potential threat to life or bodily function.       FINAL CLINICAL IMPRESSION(S) / ED DIAGNOSES   Final diagnoses:  COVID  Change in voice  Other migraine without status migrainosus, not intractable     Rx / DC Orders   ED Discharge Orders     None        Note:  This document was prepared using Dragon voice recognition software and may include unintentional dictation errors.    Pilar Jarvis, MD 03/23/22 3976    Pilar Jarvis, MD 03/23/22 7341    Pilar Jarvis, MD 03/23/22 0930

## 2022-03-23 NOTE — ED Triage Notes (Signed)
Pt seen at [place] and had multiple test run and per the pt she tested positive for covid. Apparently she sts she has been talking in an ir[place] accent unintentionally.

## 2022-03-30 ENCOUNTER — Ambulatory Visit (HOSPITAL_COMMUNITY): Payer: BC Managed Care – PPO

## 2022-03-31 ENCOUNTER — Telehealth: Payer: Self-pay | Admitting: Psychiatry

## 2022-03-31 NOTE — Telephone Encounter (Signed)
Called patient-she is scheduled for two different neurology appointments and only needs to keep one. Scheduled to see Dr. Malvin Johns at [facility] 11/29 and Dr. Delena Bali 12/6. I had left a voicemail for patient to see who she would like to see. While entering in this note-looks like patient already called and spoke with phone room and cancelled her appointment with [place]. Disregard my message

## 2022-04-06 ENCOUNTER — Other Ambulatory Visit: Payer: Self-pay

## 2022-04-06 ENCOUNTER — Emergency Department (HOSPITAL_COMMUNITY)
Admission: EM | Admit: 2022-04-06 | Discharge: 2022-04-06 | Disposition: A | Discharge status: Home / Self Care | Payer: BC Managed Care – PPO | Attending: Emergency Medicine | Admitting: Emergency Medicine

## 2022-04-06 ENCOUNTER — Encounter (HOSPITAL_COMMUNITY): Payer: Self-pay

## 2022-04-06 ENCOUNTER — Emergency Department (HOSPITAL_COMMUNITY): Payer: BC Managed Care – PPO

## 2022-04-06 DIAGNOSIS — J45909 Unspecified asthma, uncomplicated: Secondary | ICD-10-CM | POA: Diagnosis not present

## 2022-04-06 DIAGNOSIS — Z8616 Personal history of COVID-19: Secondary | ICD-10-CM | POA: Insufficient documentation

## 2022-04-06 DIAGNOSIS — G44019 Episodic cluster headache, not intractable: Secondary | ICD-10-CM | POA: Diagnosis not present

## 2022-04-06 DIAGNOSIS — R4701 Aphasia: Secondary | ICD-10-CM | POA: Diagnosis not present

## 2022-04-06 DIAGNOSIS — R519 Headache, unspecified: Secondary | ICD-10-CM | POA: Diagnosis present

## 2022-04-06 LAB — COMPREHENSIVE METABOLIC PANEL
ALT: 17 U/L (ref 0–44)
AST: 17 U/L (ref 15–41)
Albumin: 3.7 g/dL (ref 3.5–5.0)
Alkaline Phosphatase: 73 U/L (ref 38–126)
Anion gap: 9 (ref 5–15)
BUN: 7 mg/dL (ref 6–20)
CO2: 23 mmol/L (ref 22–32)
Calcium: 9.3 mg/dL (ref 8.9–10.3)
Chloride: 108 mmol/L (ref 98–111)
Creatinine, Ser: 0.85 mg/dL (ref 0.44–1.00)
GFR, Estimated: >60 mL/min (ref >60)
Glucose, Bld: 96 mg/dL (ref 70–99)
Potassium: 4.4 mmol/L (ref 3.5–5.1)
Sodium: 140 mmol/L (ref 135–145)
Total Bilirubin: 0.4 mg/dL (ref 0.3–1.2)
Total Protein: 6.9 g/dL (ref 6.5–8.1)

## 2022-04-06 LAB — CBC WITH DIFFERENTIAL/PLATELET
Abs Immature Granulocytes: 0.06 10*3/uL (ref 0.00–0.07)
Basophils Absolute: 0.1 10*3/uL (ref 0.0–0.1)
Basophils Relative: 1 %
Eosinophils Absolute: 0.6 10*3/uL — ABNORMAL HIGH (ref 0.0–0.5)
Eosinophils Relative: 5 %
HCT: 43.3 % (ref 36.0–46.0)
Hemoglobin: 14 g/dL (ref 12.0–15.0)
Immature Granulocytes: 1 %
Lymphocytes Relative: 23 %
Lymphs Abs: 2.8 10*3/uL (ref 0.7–4.0)
MCH: 29 pg (ref 26.0–34.0)
MCHC: 32.3 g/dL (ref 30.0–36.0)
MCV: 89.6 fL (ref 80.0–100.0)
Monocytes Absolute: 0.9 10*3/uL (ref 0.1–1.0)
Monocytes Relative: 7 %
Neutro Abs: 7.9 10*3/uL — ABNORMAL HIGH (ref 1.7–7.7)
Neutrophils Relative %: 63 %
Platelets: 370 10*3/uL (ref 150–400)
RBC: 4.83 MIL/uL (ref 3.87–5.11)
RDW: 12.7 % (ref 11.5–15.5)
WBC: 12.4 10*3/uL — ABNORMAL HIGH (ref 4.0–10.5)
nRBC: 0 % (ref 0.0–0.2)

## 2022-04-06 MED ORDER — SUMATRIPTAN SUCCINATE 50 MG PO TABS: 50.0000 mg | ORAL_TABLET | ORAL | 0 refills | Status: DC | PRN

## 2022-04-06 MED ORDER — DIPHENHYDRAMINE HCL 50 MG/ML IJ SOLN
50.0000 mg | Freq: Once | INTRAMUSCULAR | Status: AC
  Administered 2022-04-06: 50 mg via INTRAVENOUS
  Filled 2022-04-06: qty 1

## 2022-04-06 MED ORDER — KETOROLAC TROMETHAMINE 30 MG/ML IJ SOLN
30.0000 mg | Freq: Once | INTRAMUSCULAR | Status: AC
  Administered 2022-04-06: 30 mg via INTRAVENOUS
  Filled 2022-04-06: qty 1

## 2022-04-06 MED ORDER — ACETAMINOPHEN 325 MG PO TABS
650.0000 mg | ORAL_TABLET | Freq: Once | ORAL | Status: AC
  Administered 2022-04-06: 650 mg via ORAL
  Filled 2022-04-06: qty 2

## 2022-04-06 MED ORDER — METOCLOPRAMIDE HCL 5 MG/ML IJ SOLN
10.0000 mg | Freq: Once | INTRAMUSCULAR | Status: AC
  Administered 2022-04-06: 10 mg via INTRAVENOUS
  Filled 2022-04-06: qty 2

## 2022-04-06 MED ORDER — SODIUM CHLORIDE 0.9 % IV BOLUS
1000.0000 mL | Freq: Once | INTRAVENOUS | Status: AC
  Administered 2022-04-06: 1000 mL via INTRAVENOUS

## 2022-04-06 NOTE — ED Triage Notes (Signed)
Pt BIB [facility] home c/o a migraine that has been constant x2 weeks. Pt has tried OTC meds with no relief. Pt also endorses some expressive aphasia. Pt is intermittently slurring her words.

## 2022-04-06 NOTE — ED Notes (Signed)
All rings were removed from fingers and given to patient

## 2022-04-06 NOTE — Discharge Instructions (Addendum)
Thank you for letting us take care of you today.  As discussed, we believe you are suffering from cluster headaches. Your lab work and CT scan of your brain were reassuring and you showed significant improvement after the medications we gave you for your headache in the ED.  I am prescribing a medication called sumatriptan you can take as needed when you have these headaches. Please take it as soon as you can to the onset of these severe headaches to keep symptoms from progressing.   It is important you follow-up with [facility] for further management of your headaches. If you develop high blood pressure or known cardiac disease, you should not take this medication. The neurology team may choose to put you on a different medication to take either daily or as needed when you have these headaches.   For new or worsening symptoms, please present to your PCP or local ED for re-evaluation.

## 2022-04-06 NOTE — ED Provider Notes (Signed)
[facility] EMERGENCY DEPARTMENT Provider Note   CSN: [id number] Arrival date & time: 04/06/22  0944     History  Chief Complaint  Patient presents with   Migraine   expressive aphasia    Seymone Forlenza is a 31 y.o. female with a past medical history of PTSD, bipolar disorder, asthma, anxiety who presents complaining of a searing, stabbing headache through her left eye up to left frontal head with paresthesias to the left cheek that began 2 weeks ago. Associated symptoms include photophobia, phonophobia, intermittent twitching and dropping of L face with last episode at 0830 this morning that resolved after 30 minutes, expressive aphasia with accent switching from [place] to [place] to [place], nausea, and bilateral blurred vision. Incidentally, she did have COVID ~1 week ago though reports she has fully recovered. She goes back and forth from reporting a history of migraine headaches or other similar, less severe headaches and stating she never experienced this type of headache until 2 weeks ago. Reports she has taken "every medication under the sun" for her headaches before though can only identify Gabapentin as a medication she has tried. Denies taking any meds today. Evaluated previously in the ED for these symptoms with unremarkable workup including CT brain. Had follow-up with neurology scheduled in 2 days but per nursing reports she cancelled this appointment. She is able to ambulate normally and denies vomiting, diarrhea, fever, chills, abdominal pain, sinus pain, or cough. Reports she had a brain MRI a year ago that was normal--unavailable for review.      Home Medications Prior to Admission medications   Medication Sig Start Date End Date Taking? Authorizing Provider  SUMAtriptan (IMITREX) 50 MG tablet Take 1 tablet (50 mg total) by mouth as needed for headache (take as needed for your headaches, try to take near onset of a severe headache to keep it from  progressing). May repeat in 2 hours if headache persists or recurs. 04/06/22  Yes Jianni Batten L, PA-C  cetirizine (ZYRTEC ALLERGY) 10 MG tablet Take 1 tablet (10 mg total) by mouth at bedtime. 11/17/21 05/16/22  Theadora Rama Scales, PA-C  diclofenac Sodium (VOLTAREN) 1 % GEL Apply 4 g topically 4 (four) times daily. 08/20/21   Theadora Rama Scales, PA-C  fluticasone (FLONASE) 50 MCG/ACT nasal spray Place 1 spray into both nostrils daily. Begin by using 2 sprays in each nare daily for 3 to 5 days, then decrease to 1 spray in each nare daily. 11/17/21   Theadora Rama Scales, PA-C  levalbuterol [facility] HFA) 45 MCG/ACT inhaler Inhale 2 puffs into the lungs every 4 (four) hours as needed for wheezing. 11/18/21   Theadora Rama Scales, PA-C   Per records, though patient denies taking any daily medications on interview today.   Allergies    Lavender oil    Review of Systems   Review of Systems  Constitutional:  Negative for activity change, appetite change, chills, diaphoresis and fever.  HENT:  Negative for congestion, ear pain, rhinorrhea, sinus pain, sore throat and trouble swallowing.   Eyes:  Positive for photophobia and visual disturbance.  Respiratory:  Negative for cough, choking, chest tightness and shortness of breath.   Cardiovascular:  Negative for chest pain and palpitations.  Gastrointestinal:  Positive for nausea. Negative for abdominal pain, constipation, diarrhea and vomiting.  Genitourinary:  Negative for difficulty urinating, dysuria, flank pain and hematuria.  Musculoskeletal:  Negative for arthralgias.  Skin:  Negative for color change and rash.  Neurological:  Positive for  speech difficulty, weakness, light-headedness, numbness and headaches. Negative for seizures and syncope.  Psychiatric/Behavioral:  Positive for sleep disturbance. Behavioral problem: chronic insomnia secondary to PTSD.  All other systems reviewed and are negative.   Physical Exam Updated Vital  Signs BP 115/76   Pulse 72   Temp 98.2 F (36.8 C) (Oral)   Resp 18   LMP 03/09/2022   SpO2 98%  Physical Exam Vitals and nursing note reviewed.  Constitutional:      General: She is not in acute distress.    Appearance: Normal appearance. She is obese. She is not ill-appearing, toxic-appearing or diaphoretic.  HENT:     Head: Normocephalic and atraumatic.     Mouth/Throat:     Mouth: Mucous membranes are moist.     Pharynx: Oropharynx is clear. No oropharyngeal exudate or posterior oropharyngeal erythema.  Eyes:     General: No visual field deficit or scleral icterus.    Conjunctiva/sclera: Conjunctivae normal.     Pupils: Pupils are equal, round, and reactive to light.     Comments: Mild tearing to left eye, extraocular movements appear intact without prompted testing, with testing pt intermittently follows finger to both sides equally and up and down equally  Cardiovascular:     Rate and Rhythm: Normal rate and regular rhythm.     Heart sounds: Normal heart sounds. No murmur heard.    No gallop.  Pulmonary:     Effort: Pulmonary effort is normal. No respiratory distress.     Breath sounds: Normal breath sounds. No stridor. No wheezing, rhonchi or rales.  Abdominal:     General: Abdomen is flat. There is no distension.     Palpations: Abdomen is soft.     Tenderness: There is no abdominal tenderness. There is no guarding or rebound.  Musculoskeletal:        General: No deformity.     Cervical back: Normal range of motion and neck supple. No rigidity or tenderness.     Right lower leg: No edema.     Left lower leg: No edema.  Skin:    General: Skin is warm and dry.     Capillary Refill: Capillary refill takes less than 2 seconds.     Coloration: Skin is not jaundiced or pale.     Findings: No rash.  Neurological:     Mental Status: She is alert and oriented to person, place, and time.     GCS: GCS eye subscore is 4. GCS verbal subscore is 5. GCS motor subscore is 6.      Cranial Nerves: No facial asymmetry.     Motor: No tremor, abnormal muscle tone or pronator drift.     Coordination: Finger-Nose-Finger Test and Heel to [place] Test normal.     Gait: Gait is intact.     Comments: Intermittent expressive aphasia, mostly only present when discussing speech but otherwise answers questions appropriately and briskly, speaking in [place] accent, sensation intact to face and lower extremities, pt states sensation is diminished to left upper extremity compared to right upper extremity, poor resistance to left upper and lower extremity with strength testing but when not examining pt is moving all extremities appropriately with full range of motion readjusting herself in bed and using her iPhone     ED Results / Procedures / Treatments   Labs (all labs ordered are listed, but only abnormal results are displayed) Labs Reviewed  CBC WITH DIFFERENTIAL/PLATELET - Abnormal; Notable for the following components:  Result Value   WBC 12.4 (*)    Neutro Abs 7.9 (*)    Eosinophils Absolute 0.6 (*)    All other components within normal limits  COMPREHENSIVE METABOLIC PANEL    EKG None  Radiology CT Head Wo Contrast  Result Date: 04/06/2022 CLINICAL DATA:  Headache. EXAM: CT HEAD WITHOUT CONTRAST TECHNIQUE: Contiguous axial images were obtained from the base of the skull through the vertex without intravenous contrast. RADIATION DOSE REDUCTION: This exam was performed according to the departmental dose-optimization program which includes automated exposure control, adjustment of the mA and/or kV according to patient size and/or use of iterative reconstruction technique. COMPARISON:  03/22/2022. FINDINGS: Brain: No evidence of acute infarction, hemorrhage, hydrocephalus, extra-axial collection or mass lesion/mass effect. Vascular: No hyperdense vessel or unexpected calcification. Skull: Normal. Negative for fracture or focal lesion. Sinuses/Orbits: Globes and orbits are  unremarkable. Mild polypoid mucosal thickening along the maxillary sinuses. Remaining sinuses are clear. Other: None. IMPRESSION: 1. No intracranial abnormality. Electronically Signed   By: Amie Portlandavid  Ormond M.D.   On: 04/06/2022 12:27    Procedures None  Medications Ordered in ED Medications  acetaminophen (TYLENOL) tablet 650 mg (650 mg Oral Given 04/06/22 1335)  sodium chloride 0.9 % bolus 1,000 mL (0 mLs Intravenous Stopped 04/06/22 1725)  ketorolac (TORADOL) 30 MG/ML injection 30 mg (30 mg Intravenous Given 04/06/22 1612)  diphenhydrAMINE (BENADRYL) injection 50 mg (50 mg Intravenous Given 04/06/22 1612)  metoCLOPramide (REGLAN) injection 10 mg (10 mg Intravenous Given 04/06/22 1612)    ED Course/ Medical Decision Making/ A&P                           Medical Decision Making Risk Prescription drug management.   This is a 31 year old patient who presents with symptoms consistent with a cluster headache. She intermittently reports history of migraine headaches but denies ever using preventative or abortive medications for these headaches and presentation today most consistent with a cluster headache. Has been unable to follow-up with neurology due to lack of insurance but plans to have an appointment after December 8th. Variable exam without consistent focal findings but did obtain lab work and CT brain which was unremarkable both today and at previous visit this month. With fluctuating symptoms, do not suspect CVA or intracranial bleed with her medical history and no reported head trauma or signs of trauma to the head. Did recently have COVID but has recovered and with stable vital signs including afebrile and no other infectious symptoms, do not suspect infectious etiology. Reassured by imaging as well as resolution of symptoms following IV fluids, Reglan, Benadryl, and Toradol. On reassessment, pt is headache free and other symptoms have resolved. Reports she would like to be discharged home  to follow-up with neurology outpatient next month. Given short supply of sumatriptan with instructions to use as needed for recurrent cluster headaches until follow-up. Given strict return precautions for new or worsening neurological symptoms. Discussed with attending physician who also evaluated patient and agrees with plan.          Final Clinical Impression(s) / ED Diagnoses Final diagnoses:  Episodic cluster headache, not intractable    Rx / DC Orders ED Discharge Orders          Ordered    SUMAtriptan (IMITREX) 50 MG tablet  As needed        04/06/22 1737              Lynk Marti,  Lawrence [place]se[place], PA-C 04/06/22 1753    Eber Hong, MD 04/06/22 2330

## 2022-04-06 NOTE — ED Provider Notes (Signed)
Medical screening examination/treatment/procedure(s) were conducted as a shared visit with non-physician practitioner(s) and myself.  I personally evaluated the patient during the encounter.  Clinical Impression:   Final diagnoses:  None    This patient is a 31 year old female presents with a history of migraines however over the last 2 weeks she has had some fluctuating headache which is a little bit different, it seems to be sharp and stabbing like a knife behind her left eye radiating down her face, she is not tender to the touch.  She has had some associated photophobia but has also had difficulty with her speech and states that her voice sometimes sounds like she has a [place] accent, in the middle of my exam she converts over to a [place] accent and then back to her normal accent, she occasionally stutters over her words but answers the questions appropriately.  She has equal strength in her arms and her legs, she states that she has bad coordination but she is able to use her phone to text type and to search the Internet very smoothly.  She has normal cranial nerves III through XII.  She has some twitching of her lip sometimes to the right and sometimes to the left but there is no facial droop when tested.  CT negative  Labs unremarkable  We will treat as a cluster headache, medications will be ordered, the patient has normal vital signs without fever or stiff neck.  Doubt infectious etiology.  The patient incidentally had COVID 2 weeks ago and this headache came on after COVID     Eber Hong, MD 04/06/22 2330

## 2022-04-06 NOTE — ED Provider Triage Note (Signed)
Emergency Medicine Provider Triage Evaluation Note  Kaitlyn Luna , a 31 y.o. female  was evaluated in triage.  Pt complains of headache.  Started 2 weeks ago.  States she started history of mild but has got increasingly worse in the last couple days.  Now feels like a stabbing headache is located in the top left region of her head.  Endorses visual blurriness.  Also endorsing photophobia and phonophobia.  States that she has been taking copious amounts of Excedrin does not help.  Also mentioned that her [place] accent is returned.  She says it comes and goes.  Patient denies [place] descent.  Review of Systems  Positive: See above Negative: See above  Physical Exam  BP (!) 130/96 (BP Location: Right Wrist)   Pulse 65   Temp 98 F (36.7 C) (Oral)   Resp 16   LMP 03/09/2022   SpO2 98%  Gen:   Awake, no distress   Resp:  Normal effort  MSK:   Moves extremities without difficulty  Other:  No FND Medical Decision Making  Medically screening exam initiated at 10:38 AM.  Appropriate orders placed.  Kaitlyn Luna was informed that the remainder of the evaluation will be completed by another provider, this initial triage assessment does not replace that evaluation, and the importance of remaining in the ED until their evaluation is complete.  See above   Gareth Eagle, PA-C 04/06/22 1041

## 2022-04-10 DIAGNOSIS — G44009 Cluster headache syndrome, unspecified, not intractable: Secondary | ICD-10-CM

## 2022-04-10 HISTORY — DX: Cluster headache syndrome, unspecified, not intractable: G44.009

## 2022-04-15 ENCOUNTER — Ambulatory Visit: Payer: BC Managed Care – PPO | Admitting: Psychiatry

## 2022-04-17 ENCOUNTER — Ambulatory Visit (INDEPENDENT_AMBULATORY_CARE_PROVIDER_SITE_OTHER): Payer: BC Managed Care – PPO | Admitting: Nurse Practitioner

## 2022-04-17 ENCOUNTER — Encounter: Payer: Self-pay | Admitting: Nurse Practitioner

## 2022-04-17 DIAGNOSIS — F419 Anxiety disorder, unspecified: Secondary | ICD-10-CM

## 2022-04-17 DIAGNOSIS — G4733 Obstructive sleep apnea (adult) (pediatric): Secondary | ICD-10-CM | POA: Diagnosis not present

## 2022-04-17 DIAGNOSIS — G44029 Chronic cluster headache, not intractable: Secondary | ICD-10-CM

## 2022-04-17 DIAGNOSIS — H6122 Impacted cerumen, left ear: Secondary | ICD-10-CM

## 2022-04-17 MED ORDER — MUPIROCIN 2 % EX OINT: 1.0000 | TOPICAL_OINTMENT | Freq: Two times a day (BID) | CUTANEOUS | 0 refills | Status: DC

## 2022-04-17 MED ORDER — VENLAFAXINE HCL ER 37.5 MG PO CP24: 37.5000 mg | ORAL_CAPSULE | Freq: Every day | ORAL | 1 refills | Status: DC

## 2022-04-17 MED ORDER — SUMATRIPTAN SUCCINATE 50 MG PO TABS: 50.0000 mg | ORAL_TABLET | ORAL | 0 refills | Status: DC | PRN

## 2022-04-17 NOTE — Progress Notes (Signed)
Careteam: Patient Care Team: Sharon Seller, NP as PCP - General (Geriatric Medicine)  PLACE OF SERVICE:  [facility]  Advanced Directive information    Allergies  Allergen Reactions   Lavender Oil Hives    Chief Complaint  Patient presents with   New Patient (Initial Visit)    New patient to establish care- medical management      HPI: Patient is a 31 y.o. female to establish care.  Was without insurance and now has it therefore   She has had a lot of cluster headaches and migraines.  She went to the ED but had COVID and they contributing it to that.  She felt like she was having a stroke- face went numb and could not talk.  She went back to the ED and was diagnosised with cluster headaches.  She has been having a sharp stabbing pain. Over left eye. She is having a hard time thinking and functioning due to these headaches.  She knows her words but they can not come out.  Has not seen an headache specialist.  She has been taking the sumatriptan twice daily- this has been helping significantly but has ran out  She has had hx of migraines but after removal of cyst this improved.   This has been ongoing since May- thought she had a pinched nerve in her neck that was causing the headaches but reports imaging was negative.   She has asthma taking albuterol- having to use this once weekly. Very sensitive to cigarette smoke.   Anxiety- hx of PTSD and anxiety over her headaches, not currently on medication   Depression- in remission.   Hx of sleep apnea but could not tolerate the mask. Was last evaluated years ago.   She has had a breast reduction 10 years ago. Reports she had breast tissue removed.   Have GYN for routine PAP and has implant.   Tries to eat healthy.     Review of Systems:  Review of Systems  Constitutional:  Negative for chills, fever and weight loss.  HENT:  Negative for tinnitus.   Respiratory:  Negative for cough, sputum production and  shortness of breath.   Cardiovascular:  Negative for chest pain, palpitations and leg swelling.  Gastrointestinal:  Negative for abdominal pain, constipation, diarrhea and heartburn.  Genitourinary:  Negative for dysuria, frequency and urgency.  Musculoskeletal:  Negative for back pain, falls and myalgias.  Skin: Negative.        Random open areas on abdomen and 1 small area on neck  Neurological:  Positive for sensory change and headaches. Negative for dizziness, focal weakness and weakness.  Psychiatric/Behavioral:  Negative for depression and memory loss. The patient is nervous/anxious. The patient does not have insomnia.     Past Medical History:  Diagnosis Date   Anemia    Anxiety    Asthma    Bipolar depression (HCC)    Depression    Kidney stone    Pneumonia 2017   PTSD (post-traumatic stress disorder)    Recurrent UTI    Sleep apnea    not using c-pap   Past Surgical History:  Procedure Laterality Date   BREAST SURGERY  2013   REDUCTI0N   CERVICAL CONIZATION W/BX N/A 05/27/2017   Procedure: CONIZATION CERVIX WITH BIOPSY - COLD KNIFE;  Surgeon: Allie Bossier, MD;  Location: [facility];  Service: Gynecology;  Laterality: N/A;   KNEE ARTHROSCOPY Left    TONSILLECTOMY  Social History:   reports that she quit smoking about 6 years ago. Her smoking use included cigarettes. She has never used smokeless tobacco. She reports that she does not currently use alcohol. She reports that she does not use drugs.  Family History  Problem Relation Age of Onset   Arthritis Mother    Diabetes Father    Dementia Paternal Grandfather     Medications: Patient's Medications  New Prescriptions   MUPIROCIN OINTMENT (BACTROBAN) 2 %    Apply 1 Application topically 2 (two) times daily.   VENLAFAXINE XR (EFFEXOR XR) 37.5 MG 24 HR CAPSULE    Take 1 capsule (37.5 mg total) by mouth daily with breakfast.  Previous Medications   ALBUTEROL (VENTOLIN HFA) 108 (90 BASE) MCG/ACT INHALER    Inhale  2 puffs into the lungs every 6 (six) hours as needed.  Modified Medications   Modified Medication Previous Medication   SUMATRIPTAN (IMITREX) 50 MG TABLET SUMAtriptan (IMITREX) 50 MG tablet      Take 1 tablet (50 mg total) by mouth as needed for headache (take as needed for your headaches, try to take near onset of a severe headache to keep it from progressing). May repeat in 2 hours if headache persists or recurs.    Take 1 tablet (50 mg total) by mouth as needed for headache (take as needed for your headaches, try to take near onset of a severe headache to keep it from progressing). May repeat in 2 hours if headache persists or recurs.  Discontinued Medications   CETIRIZINE (ZYRTEC ALLERGY) 10 MG TABLET    Take 1 tablet (10 mg total) by mouth at bedtime.   DICLOFENAC SODIUM (VOLTAREN) 1 % GEL    Apply 4 g topically 4 (four) times daily.   FLUTICASONE (FLONASE) 50 MCG/ACT NASAL SPRAY    Place 1 spray into both nostrils daily. Begin by using 2 sprays in each nare daily for 3 to 5 days, then decrease to 1 spray in each nare daily.   LEVALBUTEROL (XOPENEX HFA) 45 MCG/ACT INHALER    Inhale 2 puffs into the lungs every 4 (four) hours as needed for wheezing.    Physical Exam:  Vitals:   04/17/22 1046  BP: 130/76  Pulse: 76  SpO2: 99%  Weight: (!) 357 lb (161.9 kg)  Height: 5\' 7"  (1.702 m)   Body mass index is 55.91 kg/m. Wt Readings from Last 3 Encounters:  04/17/22 (!) 357 lb (161.9 kg)  03/23/22 (!) 361 lb 8.9 oz (164 kg)  03/09/22 (!) 359 lb 11.2 oz (163.2 kg)    Physical Exam Constitutional:      General: She is not in acute distress.    Appearance: She is well-developed. She is not diaphoretic.  HENT:     Head: Normocephalic and atraumatic.     Right Ear: There is impacted cerumen.     Mouth/Throat:     Pharynx: No oropharyngeal exudate.  Eyes:     Conjunctiva/sclera: Conjunctivae normal.     Pupils: Pupils are equal, round, and reactive to light.  Cardiovascular:     Rate  and Rhythm: Normal rate and regular rhythm.     Heart sounds: Normal heart sounds.  Pulmonary:     Effort: Pulmonary effort is normal.     Breath sounds: Normal breath sounds.  Abdominal:     General: Bowel sounds are normal.     Palpations: Abdomen is soft.  Musculoskeletal:     Cervical back: Normal range of motion  and neck supple.     Right lower leg: No edema.     Left lower leg: No edema.  Skin:    General: Skin is warm and dry.  Neurological:     Mental Status: She is alert.  Psychiatric:        Mood and Affect: Mood normal.     Labs reviewed: Basic Metabolic Panel: Recent Labs    05/28/21 1150 06/10/21 1507 03/22/22 1340 04/06/22 1051  NA  --  138 136 140  K  --  4.4 4.2 4.4  CL  --  106 107 108  CO2  --  24 20* 23  GLUCOSE  --  123* 94 96  BUN  --  12 7 7   CREATININE  --  0.71 0.71 0.85  CALCIUM  --  9.2 8.9 9.3  TSH 1.730  --   --   --    Liver Function Tests: Recent Labs    03/22/22 1340 04/06/22 1051  AST 14* 17  ALT 18 17  ALKPHOS 68 73  BILITOT 0.2* 0.4  PROT 7.2 6.9  ALBUMIN 3.8 3.7   Recent Labs    03/22/22 1340  LIPASE 38   No results for input(s): "AMMONIA" in the last 8760 hours. CBC: Recent Labs    06/10/21 1507 03/22/22 1340 04/06/22 1051  WBC 14.2* 6.8 12.4*  NEUTROABS 11,942* 3.6 7.9*  HGB 12.4 14.4 14.0  HCT 37.0 43.2 43.3  MCV 87.1 88.0 89.6  PLT 401* 320 370   Lipid Panel: No results for input(s): "CHOL", "HDL", "LDLCALC", "TRIG", "CHOLHDL", "LDLDIRECT" in the last 8760 hours. TSH: Recent Labs    05/28/21 1150  TSH 1.730   A1C: Lab Results  Component Value Date   HGBA1C 5.5 05/28/2021     Assessment/Plan 1. Chronic cluster headache, not intractable - Ambulatory referral to Neurology - TSH - SUMAtriptan (IMITREX) 50 MG tablet; Take 1 tablet (50 mg total) by mouth as needed for headache (take as needed for your headaches, try to take near onset of a severe headache to keep it from progressing). May repeat  in 2 hours if headache persists or recurs.  Dispense: 30 tablet; Refill: 0 -will also start effexor to see if this help with headache and anxiety  2. Morbid obesity (HCC) --education provided on healthy weight loss through increase in physical activity and proper nutrition  -would benefit from weight and wellness program  - Lipid panel - TSH  3. Anxiety -worsening anxiety - venlafaxine XR (EFFEXOR XR) 37.5 MG 24 hr capsule; Take 1 capsule (37.5 mg total) by mouth daily with breakfast.  Dispense: 30 capsule; Refill: 1  4. OSA (obstructive sleep apnea) -not on cpap, would benefit.  - Ambulatory referral to Pulmonology  5. Impacted cerumen of left ear -ear lavage, pt tolerated well.    Return in about 4 weeks (around 05/15/2022) for headaches and anxiety . 07/14/2022. Kaitlyn Luna  [facility] [phone]

## 2022-04-18 LAB — LIPID PANEL
Cholesterol: 146 mg/dL (ref <200)
HDL: 38 mg/dL — ABNORMAL LOW (ref >=50)
LDL Cholesterol (Calc): 87 mg/dL (calc)
Non-HDL Cholesterol (Calc): 108 mg/dL (calc) (ref <130)
Total CHOL/HDL Ratio: 3.8 (calc) (ref <5.0)
Triglycerides: 115 mg/dL (ref <150)

## 2022-04-18 LAB — TSH: TSH: 1.04 mIU/L

## 2022-04-20 NOTE — Telephone Encounter (Signed)
Kaitlyn Luna, did you get her paperwork faxed?
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
## 2022-04-21 ENCOUNTER — Encounter: Payer: Self-pay | Admitting: Neurology

## 2022-04-21 ENCOUNTER — Ambulatory Visit (INDEPENDENT_AMBULATORY_CARE_PROVIDER_SITE_OTHER): Payer: BC Managed Care – PPO | Admitting: Neurology

## 2022-04-21 VITALS — BP 130/88 | HR 78 | Ht 67.0 in | Wt 359.2 lb

## 2022-04-21 DIAGNOSIS — G43E09 Chronic migraine with aura, not intractable, without status migrainosus: Secondary | ICD-10-CM

## 2022-04-21 MED ORDER — TOPIRAMATE 50 MG PO TABS: ORAL_TABLET | ORAL | 0 refills | Status: DC

## 2022-04-21 NOTE — Patient Instructions (Signed)
  Check MRI of brain with and without contrast Start topiramate 50mg  - take 1/2 tablet at bedtime for one week, then increase to 1 tablet at bedtime.  Contact in 5 weeks with update and we can increase dose if needed. Take Ubrelvy at earliest onset of headache.  May repeat dose once in 2 hours if needed.  Maximum 2 tablets in 24 hours.  Let me know if effective.  Otherwise, may use sumatriptan but limit to no more than 2 days out of the week Limit use of pain relievers to no more than 2 days out of the week.  These medications include acetaminophen, NSAIDs (ibuprofen/Advil/Motrin, naproxen/Aleve, triptans (Imitrex/sumatriptan), Excedrin, and narcotics.  This will help reduce risk of rebound headaches. Be aware of common food triggers:  - Caffeine:  coffee, black tea, cola, Mt. Dew  - Chocolate  - Dairy:  aged cheeses (brie, blue, cheddar, gouda, [place], provolone, [place], Swiss, etc), chocolate milk, buttermilk, sour cream, limit eggs and yogurt  - Nuts, peanut butter  - Alcohol  - Cereals/grains:  FRESH breads (fresh bagels, sourdough, doughnuts), yeast productions  - Processed/canned/aged/cured meats (pre-packaged deli meats, hotdogs)  - MSG/glutamate:  soy sauce, flavor enhancer, pickled/preserved/marinated foods  - Sweeteners:  aspartame (Equal, Nutrasweet).  Sugar and Splenda are okay  - Vegetables:  legumes (lima beans, lentils, snow peas, fava beans, pinto peans, peas, garbanzo beans), sauerkraut, onions, olives, pickles  - Fruit:  avocados, bananas, citrus fruit (orange, lemon, grapefruit), mango  - Other:  Frozen meals, macaroni and cheese Routine exercise Stay adequately hydrated (aim for 64 oz water daily) Keep headache diary Maintain proper stress management Maintain proper sleep hygiene Do not skip meals Consider supplements:  magnesium citrate 400mg  daily, riboflavin 400mg  daily, coenzyme Q10 100mg  three times daily.

## 2022-04-21 NOTE — Progress Notes (Signed)
Medication Samples have been provided to the patient.  Drug name: Montey Hora       Strength: 100 mg        Qty: 2  LOT: [id number]  Exp.Date: 01/2024  Dosing instructions:as needed  The patient has been instructed regarding the correct time, dose, and frequency of taking this medication, including desired effects and most common side effects.   Leida Lauth 11:04 AM 04/21/2022

## 2022-05-12 ENCOUNTER — Other Ambulatory Visit: Payer: Self-pay | Admitting: Neurology

## 2022-05-19 ENCOUNTER — Institutional Professional Consult (permissible substitution): Payer: BC Managed Care – PPO | Admitting: Primary Care

## 2022-05-25 ENCOUNTER — Ambulatory Visit: Payer: BC Managed Care – PPO | Admitting: Nurse Practitioner

## 2022-06-01 ENCOUNTER — Encounter: Payer: Self-pay | Admitting: Family

## 2022-06-01 ENCOUNTER — Ambulatory Visit (INDEPENDENT_AMBULATORY_CARE_PROVIDER_SITE_OTHER): Payer: BC Managed Care – PPO | Admitting: Family

## 2022-06-01 ENCOUNTER — Telehealth: Payer: Self-pay

## 2022-06-01 VITALS — BP 126/88 | HR 84 | Temp 96.9°F | Ht 67.0 in | Wt 364.6 lb

## 2022-06-01 DIAGNOSIS — R4781 Slurred speech: Secondary | ICD-10-CM | POA: Diagnosis not present

## 2022-06-01 DIAGNOSIS — G43009 Migraine without aura, not intractable, without status migrainosus: Secondary | ICD-10-CM | POA: Diagnosis not present

## 2022-06-01 NOTE — Progress Notes (Signed)
Provider: Dearion Huot FNP-C  Lauree Chandler, NP  Patient Care Team: Lauree Chandler, NP as PCP - General (Geriatric Medicine)  Extended Emergency Contact Information Primary Emergency Contact: Little,Edd Mobile Phone: [phone] Relation: Friend Preferred language: English Interpreter needed? No  Code Status:Full Code  Goals of care: Advanced Directive information    03/23/2022   12:22 AM  Advanced Directives  Does Patient Have a Medical Advance Directive? No     Chief Complaint  Patient presents with   Acute Visit    Patient presents today for cluster like headaches since Saturday,05/30/22. She reports taking Imitrex 50 mg at bedtime daily for migraines and its not helping with her headaches.    HPI:  Pt is a 32 y.o. female seen today for an acute visit for evaluation of cluster like headaches since 05/30/2022.Has taken Imitrex 50 mg at bedtime daily for migraines without any relief  but not helping. Described headache as sharp.states words has been slurred.states has had slurred speech for months.states was seen by Neurologist was told it's migraine.she was also seen in ED.  States was scheduled to have an MRI but did not have transported. On chart review,two MRI of the head in place ordered by two providers.  Headache associated with left side numbness and slurred speech. Light sensitivity. States unable to work due to headache and light sensitivity.  She declined to go to the emergency room for evaluation of slurred speech.  Past Medical History:  Diagnosis Date   Anemia    Anxiety    Asthma    Bipolar depression ([place])    Depression    Kidney stone    Pneumonia 2017   PTSD (post-traumatic stress disorder)    Recurrent UTI    Sleep apnea    not using c-pap   Past Surgical History:  Procedure Laterality Date   BREAST SURGERY  2013   [id number]   CARPAL TUNNEL RELEASE Left    CERVICAL CONIZATION W/BX N/A 05/27/2017   Procedure: CONIZATION CERVIX  WITH BIOPSY - COLD KNIFE;  Surgeon: Emily Filbert, MD;  Location: [facility];  Service: Gynecology;  Laterality: N/A;   KNEE ARTHROSCOPY Left    TONSILLECTOMY      Allergies  Allergen Reactions   Lavender Oil Hives    Outpatient Encounter Medications as of 06/01/2022  Medication Sig   albuterol (VENTOLIN HFA) 108 (90 Base) MCG/ACT inhaler Inhale 2 puffs into the lungs every 6 (six) hours as needed.   Etonogestrel (NEXPLANON [place]) Inject into the skin.   mupirocin ointment (BACTROBAN) 2 % Apply 1 Application topically 2 (two) times daily.   SUMAtriptan (IMITREX) 50 MG tablet Take 1 tablet (50 mg total) by mouth as needed for headache (take as needed for your headaches, try to take near onset of a severe headache to keep it from progressing). May repeat in 2 hours if headache persists or recurs.   topiramate (TOPAMAX) 50 MG tablet TAKE 1/2 TABLET BY MOUTH AT BEDTIME FOR 1 WEEK. INCREASE TO 1 TABLET AT BEDTIME   venlafaxine XR (EFFEXOR XR) 37.5 MG 24 hr capsule Take 1 capsule (37.5 mg total) by mouth daily with breakfast.   No facility-administered encounter medications on file as of 06/01/2022.    Review of Systems  Constitutional:  Negative for appetite change, chills, fatigue, fever and unexpected weight change.  HENT:  Negative for congestion, dental problem, ear discharge, ear pain, facial swelling, hearing loss, nosebleeds, postnasal drip, rhinorrhea, sinus pressure, sinus pain, sneezing, sore  throat, tinnitus and trouble swallowing.   Eyes:  Negative for pain, discharge, redness, itching and visual disturbance.       Reports light sensitivity when she has a migraine   Respiratory:  Negative for cough, chest tightness, shortness of breath and wheezing.   Cardiovascular:  Negative for chest pain, palpitations and leg swelling.  Gastrointestinal:  Negative for abdominal distention, abdominal pain, blood in stool, constipation and diarrhea.       Nausea with the migraines  Musculoskeletal:   Negative for arthralgias, back pain, gait problem, joint swelling, myalgias, neck pain and neck stiffness.  Skin:  Negative for color change, pallor and rash.  Neurological:  Positive for headaches. Negative for dizziness, syncope, weakness, light-headedness and numbness.       Migraine Reports ongoing slurred speech with migraines but has worsened declined to go to the ED for evaluation.  Psychiatric/Behavioral:  Negative for agitation, behavioral problems, confusion, hallucinations and sleep disturbance. The patient is not nervous/anxious.     Immunization History  Administered Date(s) Administered   HPV 9-valent 02/24/2017   PFIZER(Purple Top)SARS-COV-2 Vaccination 07/20/2019, 08/10/2019   Tdap 01/08/2018   Pertinent  Health Maintenance Due  Topic Date Due   INFLUENZA VACCINE  Never done   PAP SMEAR-Modifier  05/28/2024      09/16/2021    5:16 PM 11/17/2021    6:36 PM 11/21/2021    6:22 PM 03/23/2022   12:22 AM 04/06/2022    9:56 AM  Fall Risk  (RETIRED) Patient Fall Risk Level Low fall risk Low fall risk Low fall risk Low fall risk Moderate fall risk   Functional Status Survey:    Vitals:   06/01/22 1236  BP: 126/88  Pulse: 84  Temp: (!) 96.9 F (36.1 C)  SpO2: 97%  Weight: (!) 364 lb 9.6 oz (165.4 kg)  Height: 5\' 7"  (1.702 m)   Body mass index is 57.1 kg/m. Physical Exam Vitals reviewed.  Constitutional:      General: She is not in acute distress.    Appearance: Normal appearance. She is morbidly obese. She is not ill-appearing or diaphoretic.  HENT:     Head: Normocephalic.     Right Ear: Tympanic membrane, ear canal and external ear normal. There is no impacted cerumen.     Left Ear: Tympanic membrane, ear canal and external ear normal. There is no impacted cerumen.     Nose: Nose normal. No congestion or rhinorrhea.     Mouth/Throat:     Mouth: Mucous membranes are moist.     Pharynx: Oropharynx is clear. No oropharyngeal exudate or posterior oropharyngeal  erythema.  Eyes:     General: No scleral icterus.       Right eye: No discharge.        Left eye: No discharge.     Extraocular Movements: Extraocular movements intact.     Conjunctiva/sclera: Conjunctivae normal.     Pupils: Pupils are equal, round, and reactive to light.  Neck:     Vascular: No carotid bruit.  Cardiovascular:     Rate and Rhythm: Normal rate and regular rhythm.     Pulses: Normal pulses.     Heart sounds: Normal heart sounds. No murmur heard.    No friction rub. No gallop.  Pulmonary:     Effort: Pulmonary effort is normal. No respiratory distress.     Breath sounds: Normal breath sounds. No wheezing, rhonchi or rales.  Chest:     Chest wall: No tenderness.  Abdominal:  General: Bowel sounds are normal. There is no distension.     Palpations: Abdomen is soft. There is no mass.     Tenderness: There is no abdominal tenderness. There is no right CVA tenderness, left CVA tenderness, guarding or rebound.  Musculoskeletal:        General: No swelling or tenderness. Normal range of motion.     Cervical back: Normal range of motion. No rigidity or tenderness.     Right lower leg: No edema.     Left lower leg: No edema.  Lymphadenopathy:     Cervical: No cervical adenopathy.  Skin:    General: Skin is warm and dry.     Coloration: Skin is not pale.     Findings: No bruising, erythema, lesion or rash.  Neurological:     Mental Status: She is alert and oriented to person, place, and time.     Cranial Nerves: No cranial nerve deficit.     Sensory: No sensory deficit.     Motor: No weakness.     Coordination: Coordination normal.     Gait: Gait normal.  Psychiatric:        Mood and Affect: Mood normal.        Speech: Speech normal.        Behavior: Behavior normal.        Thought Content: Thought content normal.        Judgment: Judgment normal.     Labs reviewed: Recent Labs    06/10/21 1507 03/22/22 1340 04/06/22 1051  NA 138 136 140  K 4.4 4.2  4.4  CL 106 107 108  CO2 24 20* 23  GLUCOSE 123* 94 96  BUN 12 7 7   CREATININE 0.71 0.71 0.85  CALCIUM 9.2 8.9 9.3   Recent Labs    03/22/22 1340 04/06/22 1051  AST 14* 17  ALT 18 17  ALKPHOS 68 73  BILITOT 0.2* 0.4  PROT 7.2 6.9  ALBUMIN 3.8 3.7   Recent Labs    06/10/21 1507 03/22/22 1340 04/06/22 1051  WBC 14.2* 6.8 12.4*  NEUTROABS 11,942* 3.6 7.9*  HGB 12.4 14.4 14.0  HCT 37.0 43.2 43.3  MCV 87.1 88.0 89.6  PLT 401* 320 370   Lab Results  Component Value Date   TSH 1.04 04/17/2022   Lab Results  Component Value Date   HGBA1C 5.5 05/28/2021   Lab Results  Component Value Date   CHOL 146 04/17/2022   HDL 38 (L) 04/17/2022   LDLCALC 87 04/17/2022   TRIG 115 04/17/2022   CHOLHDL 3.8 04/17/2022    Significant Diagnostic Results in last 30 days:  No results found.  Assessment/Plan 1. Migraine without aura and without status migrainosus, not intractable Reports worsening migraines with associated slurred speech, nausea and light sensitivity.  Patient declined to go to the ED for evaluation of slurred speech.  Head MRI that was ordered in the past but stated did not have transportation so MRI was not done.  Will order stat MRI. -Continue on Topamax and Imitrex - MR Brain W Wo Contrast; Future  2. Slurred speech Recommended evaluation in the ED but patient declined states went to the ED in the past but nothing was done. Jasmine Dillard,CMA called [facility] to check on availability of state MRI appointment for patient.Imaging called pt and was able to schedule appointment.  - MR Brain W Wo Contrast; Future  Family/ staff Communication: Reviewed plan of care with patient verbalized understanding  Labs/tests ordered: -  MR Brain W Wo Contrast; Future  Next Appointment: Return if symptoms worsen or fail to improve.   Caesar Bookman, NP

## 2022-06-01 NOTE — Telephone Encounter (Signed)
Patient called c/o "Feels like molasses in head," face hurting, cluster headache, slurred speech, and head confusion. I recommended patient go to the ER multiple times and patient refused. Patient states she went to the ER and they did nothing

## 2022-06-01 NOTE — Telephone Encounter (Signed)
Recommend urgent evaluation of slurred speech in the Emergency room  or urgent care ASAP.

## 2022-06-01 NOTE — Telephone Encounter (Signed)
This was recommended for the patient more than once and she refused stating she would rather come here. Patient is scheduled to be seen today at 1 pm

## 2022-06-02 ENCOUNTER — Ambulatory Visit (INDEPENDENT_AMBULATORY_CARE_PROVIDER_SITE_OTHER): Payer: BC Managed Care – PPO | Admitting: Primary Care

## 2022-06-02 ENCOUNTER — Telehealth: Payer: Self-pay | Admitting: *Deleted

## 2022-06-02 ENCOUNTER — Encounter: Payer: Self-pay | Admitting: Primary Care

## 2022-06-02 DIAGNOSIS — Z8669 Personal history of other diseases of the nervous system and sense organs: Secondary | ICD-10-CM | POA: Diagnosis not present

## 2022-06-02 DIAGNOSIS — G43909 Migraine, unspecified, not intractable, without status migrainosus: Secondary | ICD-10-CM | POA: Insufficient documentation

## 2022-06-02 DIAGNOSIS — F4310 Post-traumatic stress disorder, unspecified: Secondary | ICD-10-CM

## 2022-06-02 DIAGNOSIS — R0683 Snoring: Secondary | ICD-10-CM | POA: Diagnosis not present

## 2022-06-02 DIAGNOSIS — G43819 Other migraine, intractable, without status migrainosus: Secondary | ICD-10-CM

## 2022-06-02 DIAGNOSIS — F5104 Psychophysiologic insomnia: Secondary | ICD-10-CM

## 2022-06-02 DIAGNOSIS — F332 Major depressive disorder, recurrent severe without psychotic features: Secondary | ICD-10-CM

## 2022-06-02 DIAGNOSIS — G2581 Restless legs syndrome: Secondary | ICD-10-CM

## 2022-06-02 DIAGNOSIS — G4700 Insomnia, unspecified: Secondary | ICD-10-CM | POA: Insufficient documentation

## 2022-06-02 NOTE — Assessment & Plan Note (Signed)
-  Difficulty falling asleep, well-controlled with melatonin 10 mg at bedtime

## 2022-06-02 NOTE — Assessment & Plan Note (Deleted)
-  Following with psychiatry and neurology

## 2022-06-02 NOTE — Telephone Encounter (Signed)
Patient was seen yesterday by Dinah. Dropped off FMLA Paperwork to have completed.  Employer: [employer]. [employer]. Paperwork to be completed by 06/17/2022. Condition Start Date 1/22/2024x7 days.   Paperwork placed in Dinah's folder to review and complete.  Call patient once completed.

## 2022-06-02 NOTE — Telephone Encounter (Signed)
FMLA paper work completed placed on Kaitlyn Luna's desk to be faxed or picked up by patient.

## 2022-06-02 NOTE — Assessment & Plan Note (Signed)
-  Following with neurology, scheduled for MRI of the brain

## 2022-06-02 NOTE — Assessment & Plan Note (Addendum)
-  History of sleep apnea, previously on CPAP but had difficulty tolerating. She has symptoms of loud snoring, migraine headaches, difficulty concentrating and daytime fatigue.  She also reports symptoms of recurrent nightmares and restless leg symptoms. BMI 55. Epworth score 16/24. I have a strong suspicion patient has underlying sleep apnea, needs in lab split-night sleep study to evaluate for OSA and CPAP pressure settings.  We reviewed risks of untreated sleep apnea including cardiac arrhythmias, pulmonary hypertension, diabetes and stroke.  We also discussed treatment options including weight loss, oral appliance, CPAP therapy or referral to ENT for possible surgical options.  Patient is open to resuming CPAP if needed.  Encouraged weight loss efforts and side sleeping position.  Advised against driving if experiencing excessive daytime sleepiness fatigue.  Follow-up in 6 to 8 weeks to review sleep study results and treatment options if needed.

## 2022-06-02 NOTE — Assessment & Plan Note (Addendum)
-  Not currently on medication - Following with psychiatry and attending therapist

## 2022-06-02 NOTE — Progress Notes (Signed)
@Patient  ID: , female    DOB: 06/30/1990, 32 y.o.   MRN: [id number]  Chief Complaint  Patient presents with   Consult    Sleep sleep study done 2009 Epworth :16    Referring provider: 2010, NP  HPI: 32 year old female, former smoker quit in 2017.  Past medical history significant for PTSD, major depressive disorder, morbid obesity, insomnia, CIN 2, heavy periods.   06/02/2022 Presents today for sleep consult. History of sleep apnea. She previously had difficulty tolerating CPAP. She is open to resuming if needed. She has symptoms of migraine headaches, difficulty concentrating, loud snoring and daytime fatigue. She reports speaking with an 06/04/2022 accent unintentionally at time especially when tired. She works from home for [employer]. She is having's difficulty at work, complains of issues with mental acuity. She wakes up with vertigo symptoms, headache last all day. She is seeing neurology, scheduled from MRI brian. She has PTSD and experiences frequently nightmares. She has a psychiatrist whom she last saw a couple months back and therapist. She stopped taking Prozac because she did not like how it made her feel. She is not currently on any psych medication. She has insomnia symptoms. She takes 10mg  Melatonin at bedtime as needed which helps her fall asleep.  Denies symptoms of narcolepsy, cataplexy or sleepwalking.  Sleep questionnaire Symptoms-   snoring, insomnia, difficulty concentrating, daytime sleepiness Prior sleep study- yes Bedtime-9:30-10pm Time to fall asleep- 30 mins Nocturnal awakenings- 2-3 times Out of bed/start of day- 6:30-7am Weight changes- no Do you operate heavy machinery- no Do you currently wear CPAP- no Do you current wear oxygen- no Epworth- 16    Allergies  Allergen Reactions   Lavender Oil Hives    Immunization History  Administered Date(s) Administered   HPV 9-valent 02/24/2017   PFIZER(Purple Top)SARS-COV-2  Vaccination 07/20/2019, 08/10/2019   Tdap 01/08/2018    Past Medical History:  Diagnosis Date   Anemia    Anxiety    Asthma    Bipolar depression ([facility])    Depression    Kidney stone    Pneumonia 2017   PTSD (post-traumatic stress disorder)    Recurrent UTI    Sleep apnea    not using c-pap    Tobacco History: Social History   Tobacco Use  Smoking Status Former   Types: Cigarettes   Quit date: 12/07/2015   Years since quitting: 6.4  Smokeless Tobacco Never   Counseling given: Not Answered   Outpatient Medications Prior to Visit  Medication Sig Dispense Refill   albuterol (VENTOLIN HFA) 108 (90 Base) MCG/ACT inhaler Inhale 2 puffs into the lungs every 6 (six) hours as needed.     Etonogestrel (NEXPLANON [place]) Inject into the skin.     mupirocin ointment (BACTROBAN) 2 % Apply 1 Application topically 2 (two) times daily. 22 g 0   SUMAtriptan (IMITREX) 50 MG tablet Take 1 tablet (50 mg total) by mouth as needed for headache (take as needed for your headaches, try to take near onset of a severe headache to keep it from progressing). May repeat in 2 hours if headache persists or recurs. 30 tablet 0   topiramate (TOPAMAX) 50 MG tablet TAKE 1/2 TABLET BY MOUTH AT BEDTIME FOR 1 WEEK. INCREASE TO 1 TABLET AT BEDTIME 30 tablet 0   venlafaxine XR (EFFEXOR XR) 37.5 MG 24 hr capsule Take 1 capsule (37.5 mg total) by mouth daily with breakfast. 30 capsule 1   No facility-administered medications prior to visit.  Review of Systems  Review of Systems  Constitutional:  Positive for fatigue.  HENT: Negative.    Respiratory:  Positive for apnea. Negative for cough, shortness of breath and wheezing.   Cardiovascular: Negative.   Neurological:  Positive for headaches.       New accent while speaking  Psychiatric/Behavioral:  Positive for decreased concentration and sleep disturbance.    Physical Exam  BP 132/78 (BP Location: Left Arm, Patient Position: Sitting, Cuff Size: Large)    Pulse 63   Ht 5\' 8"  (1.727 m)   Wt (!) 362 lb 6.4 oz (164.4 kg)   SpO2 100%   BMI 55.10 kg/m  Physical Exam Constitutional:      Appearance: Normal appearance. She is obese.  HENT:     Head: Normocephalic and atraumatic.  Cardiovascular:     Rate and Rhythm: Normal rate and regular rhythm.  Pulmonary:     Effort: Pulmonary effort is normal.     Breath sounds: Normal breath sounds.  Musculoskeletal:        General: Normal range of motion.  Skin:    General: Skin is warm and dry.  Neurological:     General: No focal deficit present.     Mental Status: She is alert and oriented to person, place, and time. Mental status is at baseline.  Psychiatric:        Mood and Affect: Mood normal.        Behavior: Behavior normal.        Thought Content: Thought content normal.        Judgment: Judgment normal.      Lab Results:  CBC    Component Value Date/Time   WBC 12.4 (H) 04/06/2022 1051   RBC 4.83 04/06/2022 1051   HGB 14.0 04/06/2022 1051   HCT 43.3 04/06/2022 1051   PLT 370 04/06/2022 1051   MCV 89.6 04/06/2022 1051   MCH 29.0 04/06/2022 1051   MCHC 32.3 04/06/2022 1051   RDW 12.7 04/06/2022 1051   LYMPHSABS 2.8 04/06/2022 1051   MONOABS 0.9 04/06/2022 1051   EOSABS 0.6 (H) 04/06/2022 1051   BASOSABS 0.1 04/06/2022 1051    BMET    Component Value Date/Time   NA 140 04/06/2022 1051   K 4.4 04/06/2022 1051   CL 108 04/06/2022 1051   CO2 23 04/06/2022 1051   GLUCOSE 96 04/06/2022 1051   BUN 7 04/06/2022 1051   CREATININE 0.85 04/06/2022 1051   CREATININE 0.71 06/10/2021 1507   CALCIUM 9.3 04/06/2022 1051   GFRNONAA >60 04/06/2022 1051   GFRAA >90 07/28/2013 2056    BNP No results found for: "BNP"  ProBNP No results found for: "PROBNP"  Imaging: No results found.   Assessment & Plan:   Loud snoring - History of sleep apnea, previously on CPAP but had difficulty tolerating. She has symptoms of loud snoring, migraine headaches, difficulty  concentrating and daytime fatigue.  She also reports symptoms of recurrent nightmares and restless leg symptoms. BMI 55. Epworth score 16/24. I have a strong suspicion patient has underlying sleep apnea, needs in lab split-night sleep study to evaluate for OSA and CPAP pressure settings.  We reviewed risks of untreated sleep apnea including cardiac arrhythmias, pulmonary hypertension, diabetes and stroke.  We also discussed treatment options including weight loss, oral appliance, CPAP therapy or referral to ENT for possible surgical options.  Patient is open to resuming CPAP if needed.  Encouraged weight loss efforts and side sleeping position.  Advised against  driving if experiencing excessive daytime sleepiness fatigue.  Follow-up in 6 to 8 weeks to review sleep study results and treatment options if needed.  Insomnia - Difficulty falling asleep, well-controlled with melatonin 10 mg at bedtime  Migraine headache - Following with neurology, scheduled for MRI of the brain  MDD (major depressive disorder), recurrent severe, without psychosis - Not currently on medication - Following with psychiatry and attending therapist   Martyn Ehrich, NP 06/02/2022

## 2022-06-02 NOTE — Patient Instructions (Addendum)
  Sleep apnea is defined as period of 10 seconds or longer when you stop breathing at night. This can happen multiple times a night. Dx sleep apnea is when this occurs more than 5 times an hour.    Mild OSA 5-15 apneic events an hour Moderate OSA 15-30 apneic events an hour Severe OSA > 30 apneic events an hour   Untreated sleep apnea puts you at higher risk for cardiac arrhythmias, pulmonary HTN, stroke and diabetes  Treatment options include weight loss, side sleeping position, oral appliance, CPAP therapy or referral to ENT for possible surgical options    Recommendations: Focus on side sleeping position or elevate head with wedge pillow 30 degrees Work on weight loss efforts if able  Do not drive if experiencing excessive daytime sleepiness of fatigue    Orders: Split night sleep study re: loud snoring    Follow-up: 6-8 week follow-up with [facility] NP or sooner if needed

## 2022-06-03 NOTE — Telephone Encounter (Signed)
Spoke with patient and she is aware of $25 fee and agreed. Patient will pick up form. Form Copied, placed for scanning and placed up fron in file cabinet for pick up.

## 2022-06-03 NOTE — Telephone Encounter (Signed)
I called patient and left message for her to call office about FMLA paperwork. Also, called to inform patient of $25.00 fee for form and if form needs to be faxed or if she will pick it up.

## 2022-06-03 NOTE — Telephone Encounter (Signed)
Forwarded to Evie in Clinical intake to Process Paperwork appropriately

## 2022-06-04 ENCOUNTER — Ambulatory Visit
Admission: RE | Admit: 2022-06-04 | Discharge: 2022-06-04 | Disposition: A | Discharge status: Home / Self Care | Payer: BC Managed Care – PPO | Source: Ambulatory Visit | Attending: Family | Admitting: Family

## 2022-06-04 DIAGNOSIS — R4781 Slurred speech: Secondary | ICD-10-CM

## 2022-06-04 DIAGNOSIS — G43009 Migraine without aura, not intractable, without status migrainosus: Secondary | ICD-10-CM

## 2022-06-04 IMAGING — US US PELVIS COMPLETE WITH TRANSVAGINAL
1 series · 15 of 25 positions shown · non-contrast
Comparison: 11/01/2015

CLINICAL DATA: Irregular menses, history of ovarian cysts, LMP
12/11/2019, has Nexplanon



[Series 1: us pelvis complete with transvaginal · 38 acquisitions, 15 frames shown]
[im 1/38]
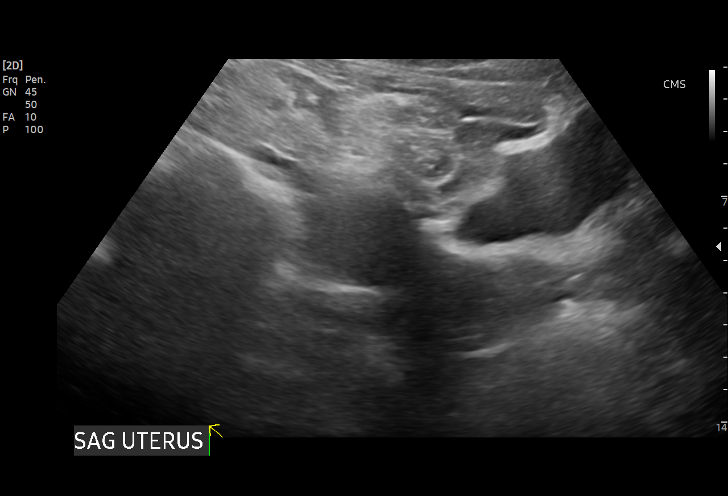
[im 4/38]
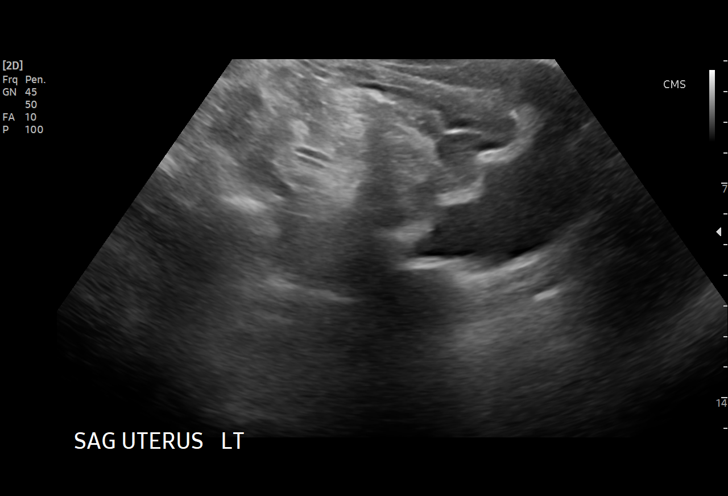
[im 7/38]
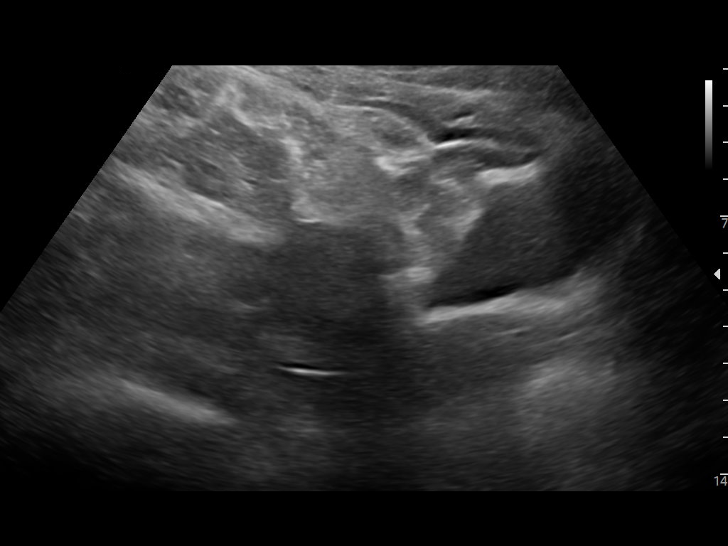
[im 8/38]
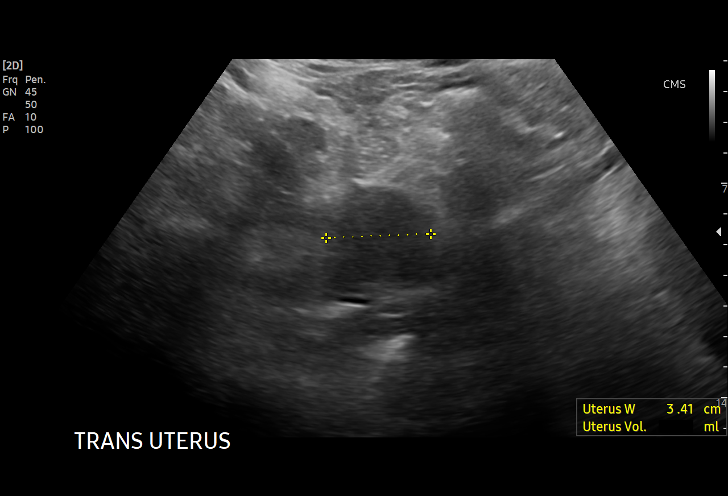
[im 11/38]
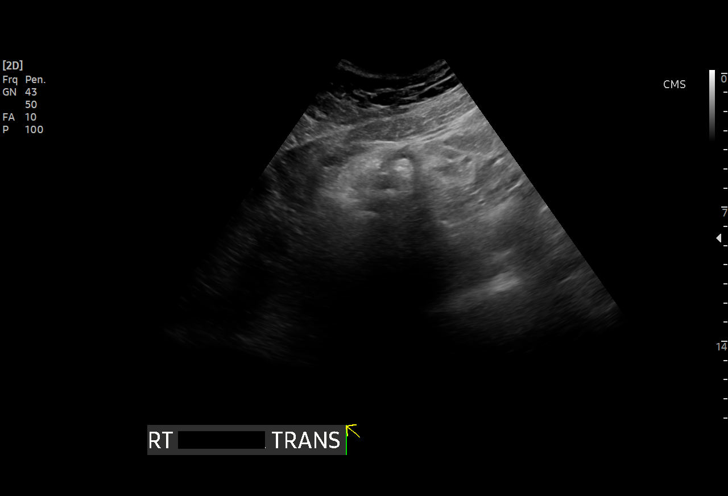
[im 14/38]
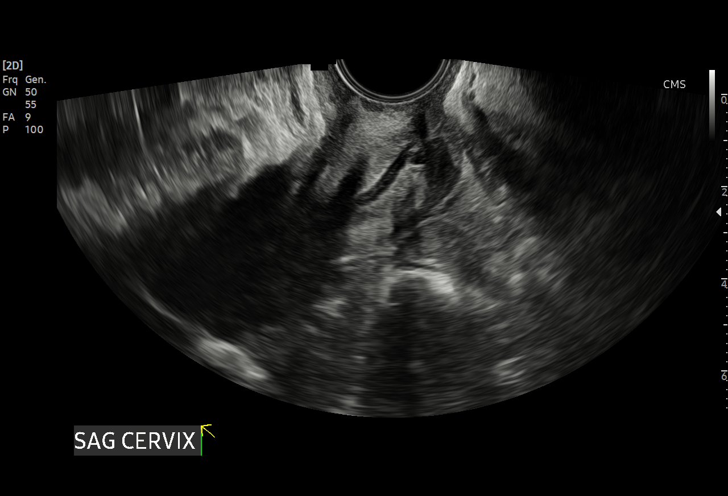
[im 16/38]
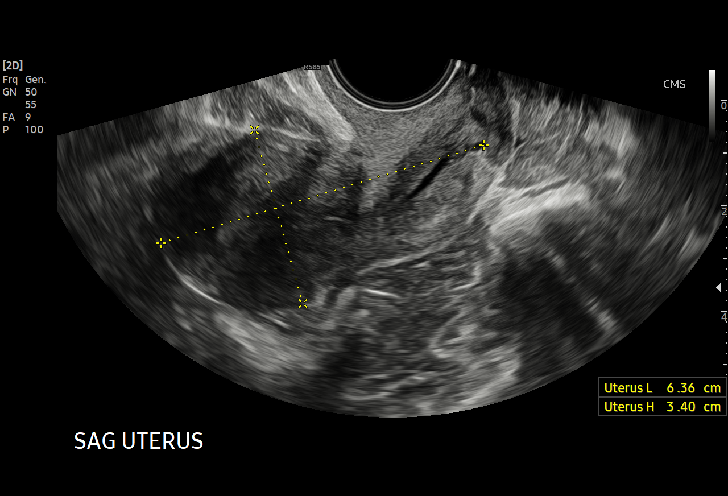
[im 19/38]
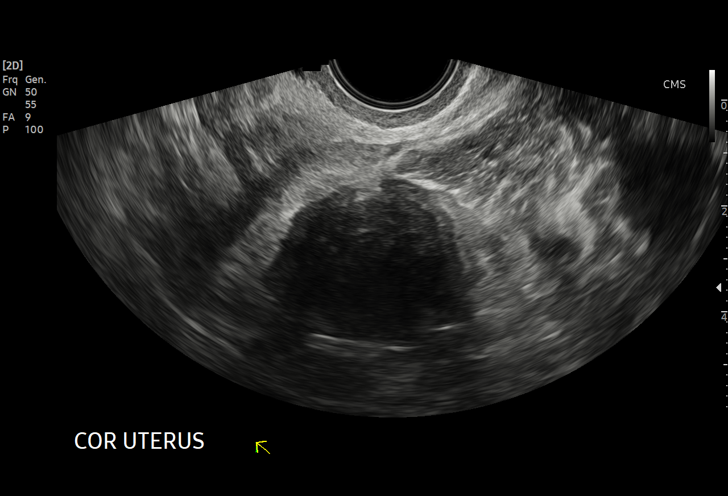
[im 22/38]
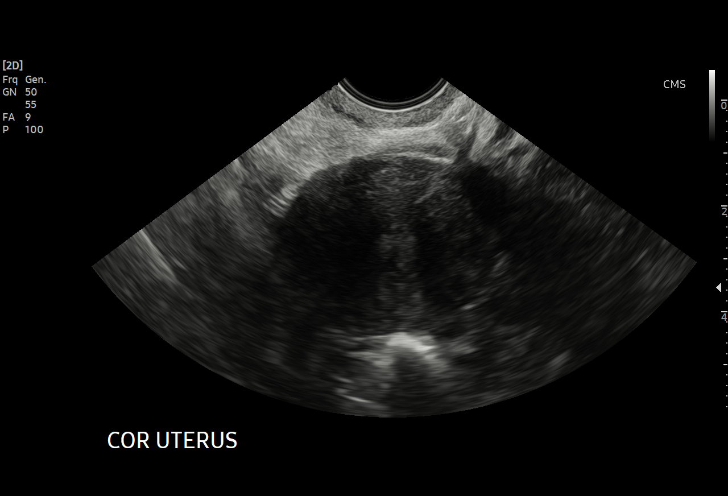
[im 24/38]
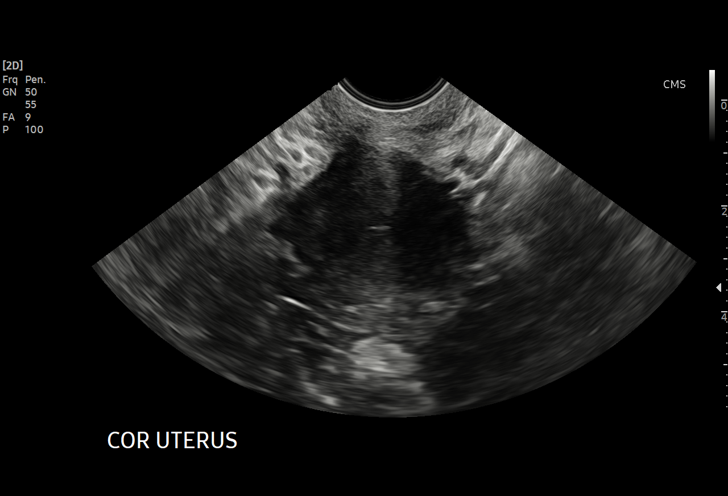
[im 27/38]
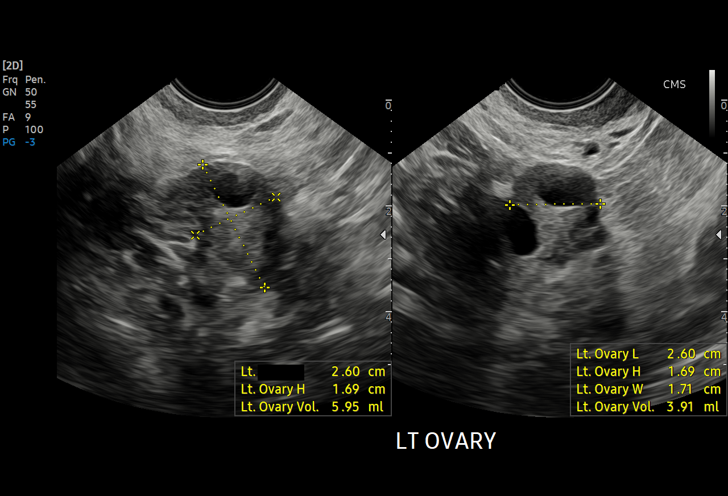
[im 30/38]
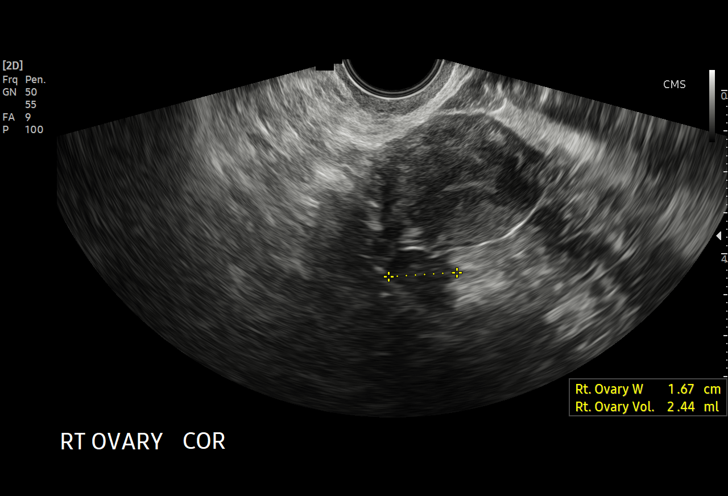
[im 31/38]
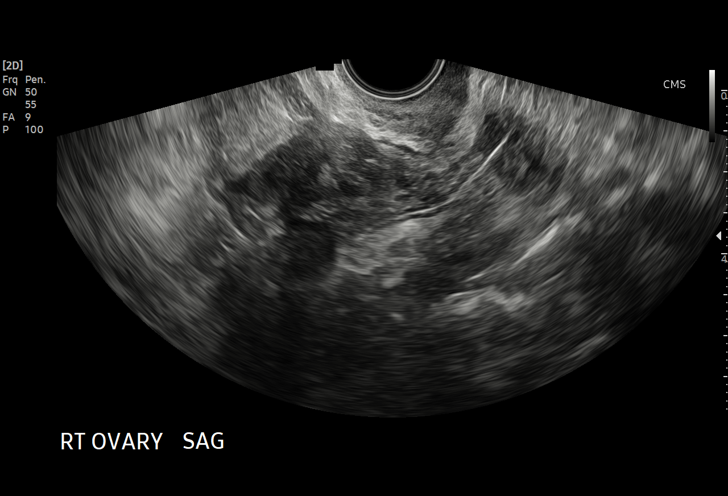
[im 34/38]
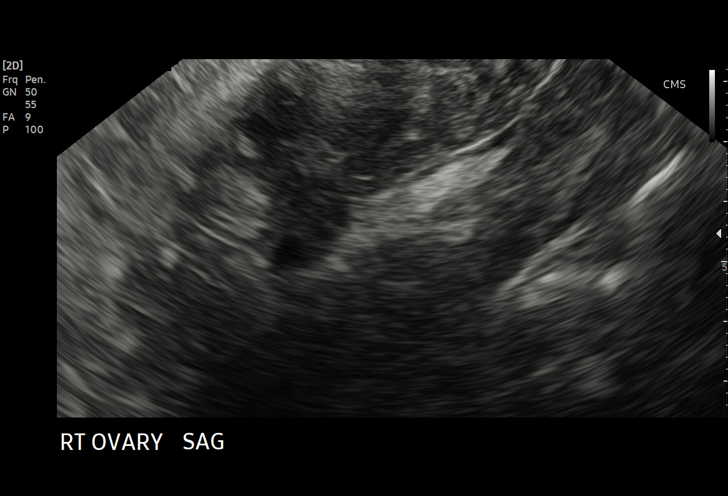
[im 38/38]
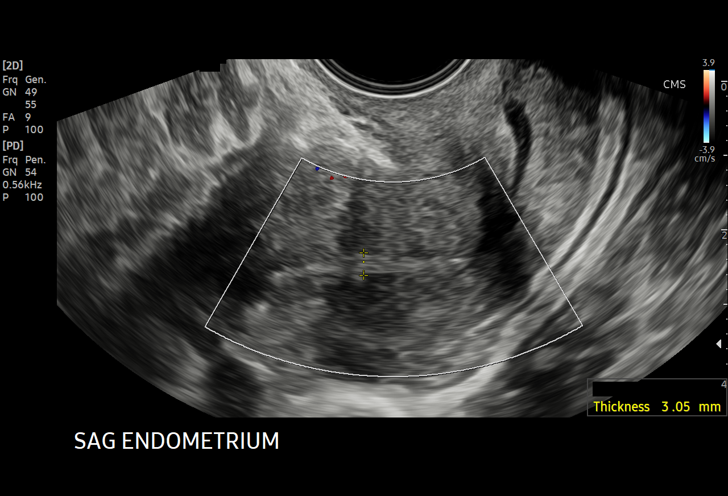

[15 of 25 positions shown; findings below may reference images not displayed]

FINDINGS: Uterus

Measurements: 6.4 x 3.4 x 4.1 cm = volume: 46 mL. Anteverted.
Heterogeneous myometrium. Few scattered areas of shadowing. No
discrete uterine mass.

Endometrium

Thickness: 3 mm.  No endometrial fluid or mass

Right ovary

Measurements: 1.6 x 1.4 x 1.7 cm = volume: 1.9 mL. Normal morphology
without mass

Left ovary

Measurements: 2.6 x 1.7 x 1.7 cm = volume: 3.9 mL. Normal morphology
without mass

Other findings

No free pelvic fluid or adnexal masses. Trace nonspecific
endocervical canal fluid.
IMPRESSION: Unremarkable ovaries, adnexa, and endometrial complex.

Trace nonspecific endocervical fluid.

No focal uterine abnormalities.

## 2022-06-04 MED ORDER — GADOPICLENOL 0.5 MMOL/ML IV SOLN
10.0000 mL | Freq: Once | INTRAVENOUS | Status: AC | PRN
  Administered 2022-06-04: 10 mL via INTRAVENOUS

## 2022-06-05 NOTE — Progress Notes (Signed)
Reviewed and agree with assessment/plan.   Lauranne Beyersdorf, MD [facility] 06/05/2022, 7:21 AM Pager:  [phone]  

## 2022-06-07 IMAGING — US US EXTREM LOW VENOUS*R*
2 series · 13 of 24 positions shown · non-contrast
Comparison: None.

CLINICAL DATA: Right foot pain and swelling



[Series 1: us extrem low venous*right* · 12 of 38 slices shown (1 of 2)]
[im 1/38]
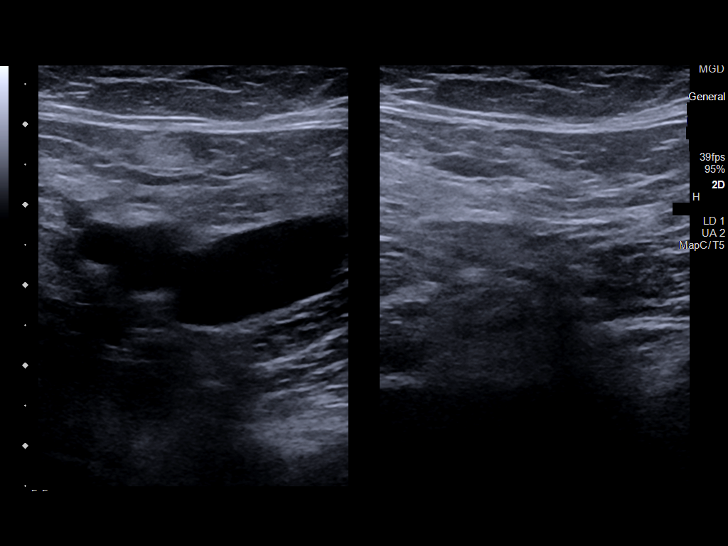
[im 4/38]
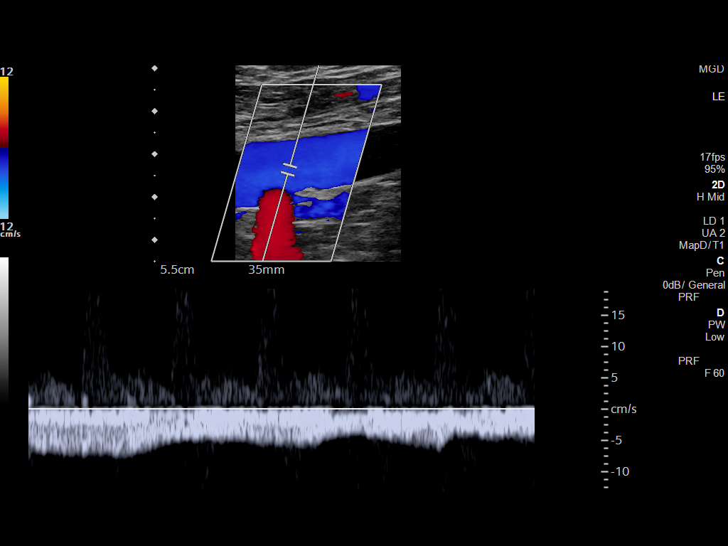
[im 8/38]
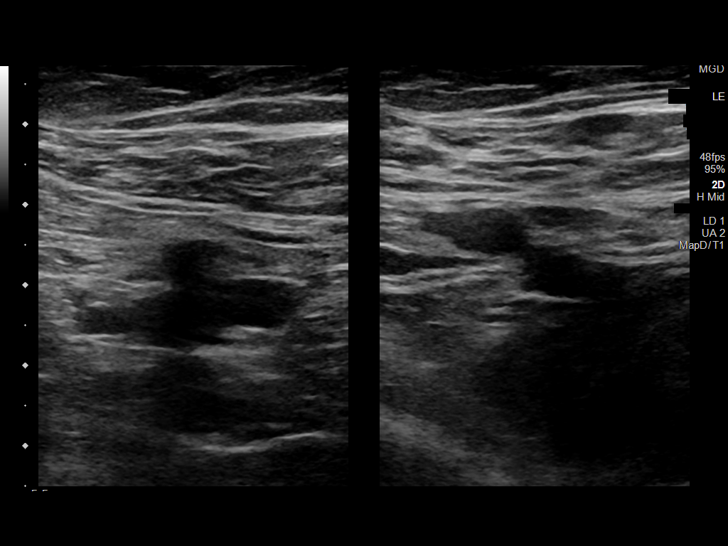
[im 11/38]
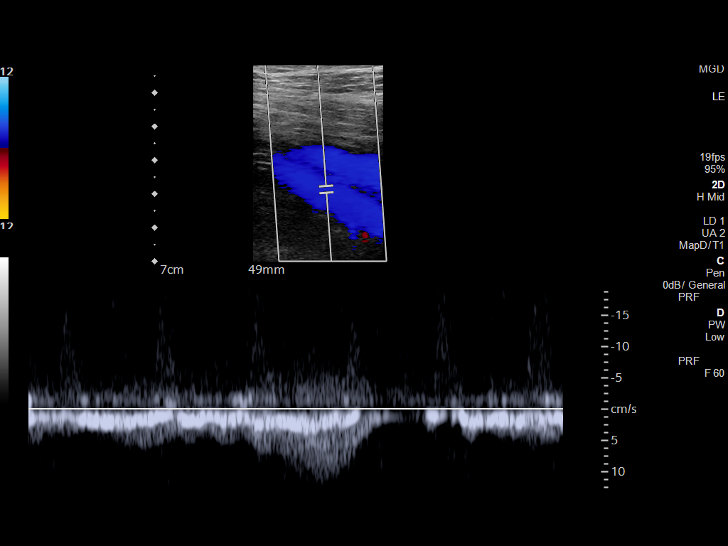
[im 15/38]
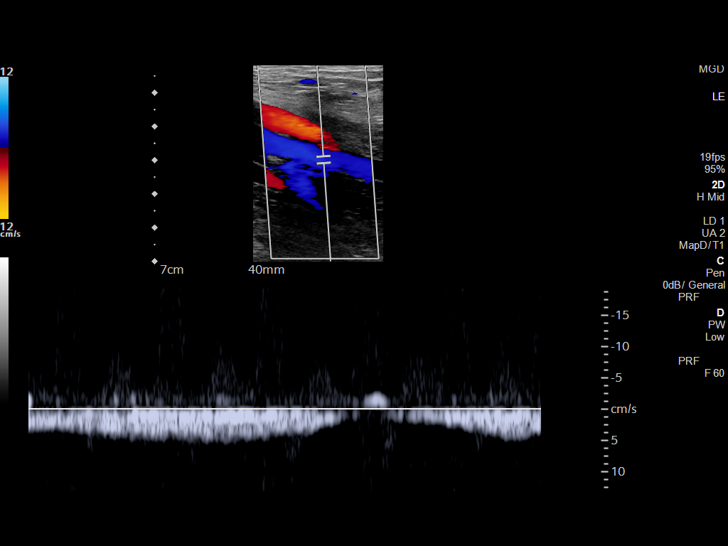
[im 18/38]
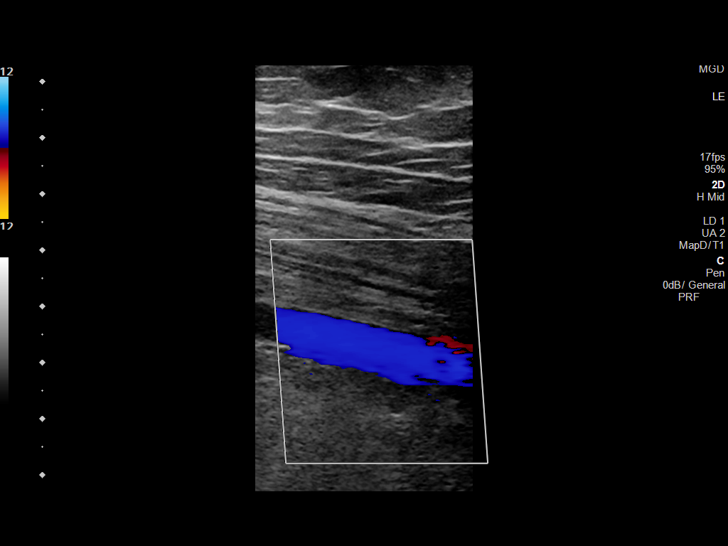
[im 22/38]
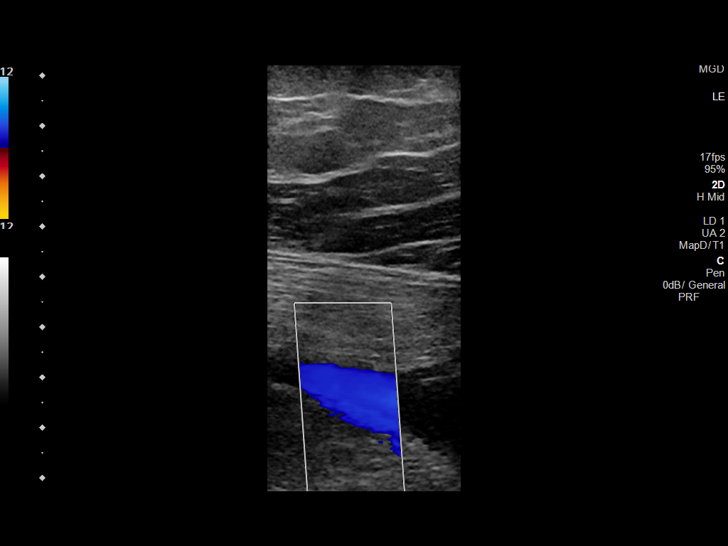
[im 23/38]
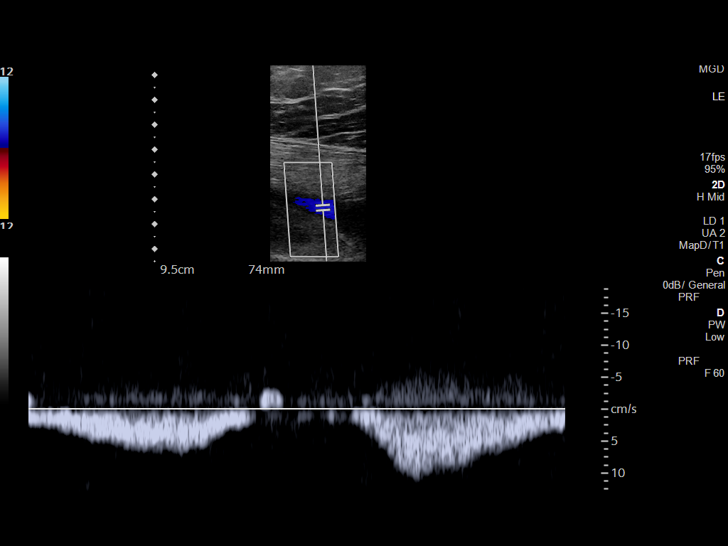
[im 27/38]
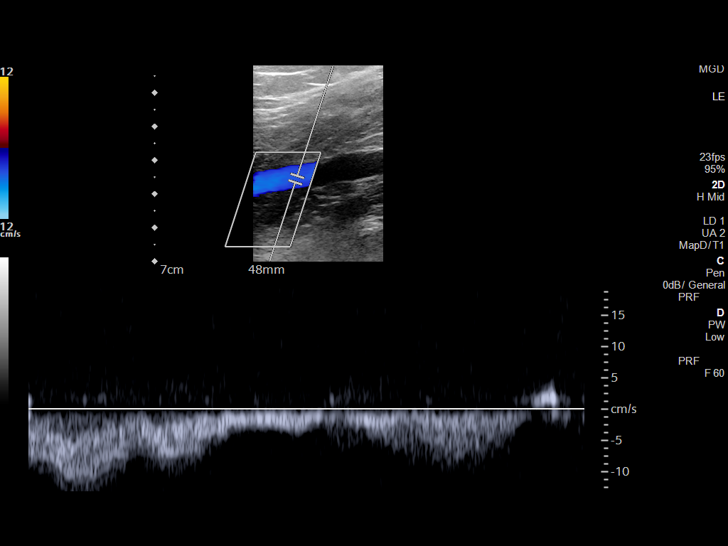
[im 30/38]
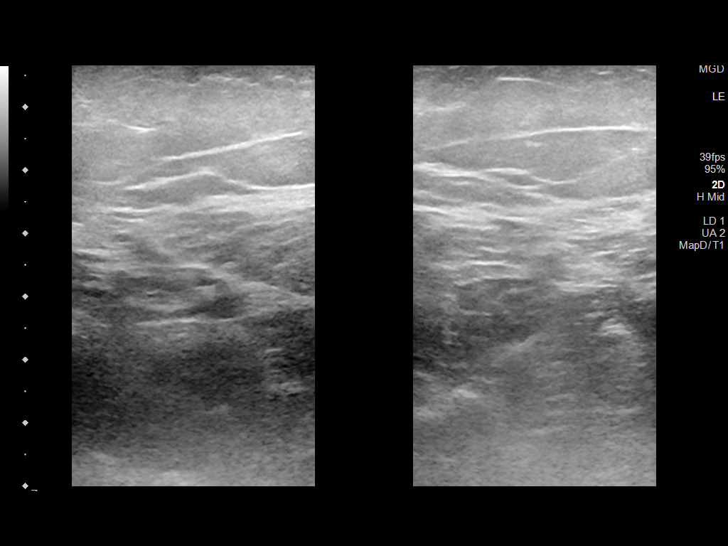
[im 34/38]
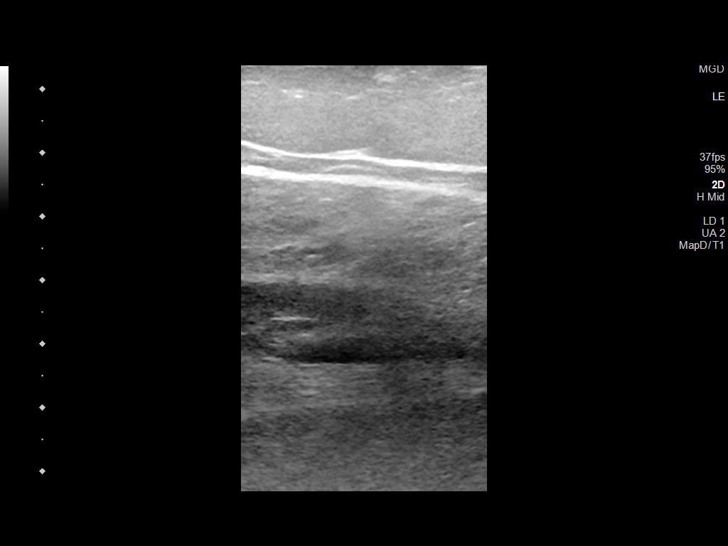
[im 38/38]
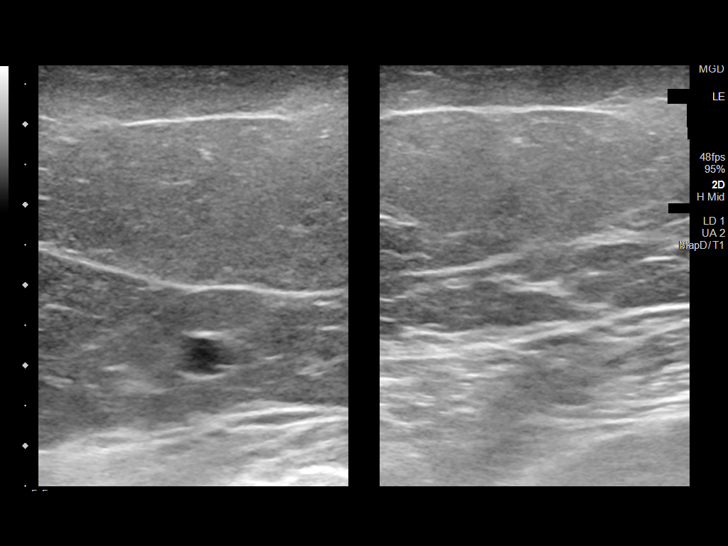

[Series 3: us extrem low venous*right* · 1 of 4 slices shown (2 of 2)]
[im 4/4]
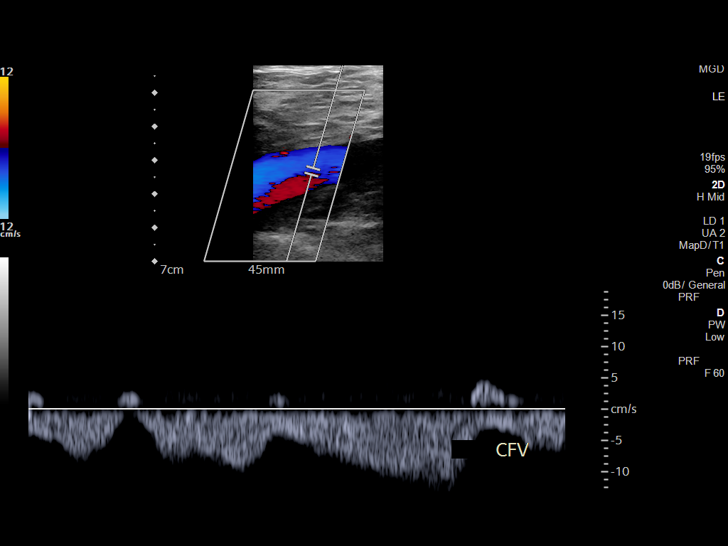

[13 of 24 positions shown; findings below may reference images not displayed]

FINDINGS: Contralateral Common Femoral Vein: Respiratory phasicity is normal
and symmetric with the symptomatic side. No evidence of thrombus.
Normal compressibility.

Common Femoral Vein: No evidence of thrombus. Normal
compressibility, respiratory phasicity and response to augmentation.

Saphenofemoral Junction: No evidence of thrombus. Normal
compressibility and flow on color Doppler imaging.

Profunda Femoral Vein: No evidence of thrombus. Normal
compressibility and flow on color Doppler imaging.

Femoral Vein: No evidence of thrombus. Normal compressibility,
respiratory phasicity and response to augmentation.

Popliteal Vein: No evidence of thrombus. Normal compressibility,
respiratory phasicity and response to augmentation.

Calf Veins: No evidence of thrombus. Normal compressibility and flow
on color Doppler imaging.

Superficial Great Saphenous Vein: No evidence of thrombus. Normal
compressibility.

Venous Reflux:  None.

Other Findings:  None.
IMPRESSION: No evidence of deep venous thrombosis.

## 2022-06-11 ENCOUNTER — Other Ambulatory Visit: Payer: Self-pay | Admitting: Nurse Practitioner

## 2022-06-11 DIAGNOSIS — F419 Anxiety disorder, unspecified: Secondary | ICD-10-CM

## 2022-06-22 ENCOUNTER — Ambulatory Visit: Payer: BC Managed Care – PPO | Admitting: Nurse Practitioner

## 2022-06-23 ENCOUNTER — Ambulatory Visit: Payer: BC Managed Care – PPO | Admitting: Family

## 2022-06-29 ENCOUNTER — Other Ambulatory Visit: Payer: Self-pay

## 2022-06-29 ENCOUNTER — Encounter (HOSPITAL_BASED_OUTPATIENT_CLINIC_OR_DEPARTMENT_OTHER): Payer: Self-pay

## 2022-06-29 ENCOUNTER — Emergency Department (HOSPITAL_BASED_OUTPATIENT_CLINIC_OR_DEPARTMENT_OTHER)
Admission: EM | Admit: 2022-06-29 | Discharge: 2022-06-29 | Disposition: A | Discharge status: Home / Self Care | Payer: BC Managed Care – PPO | Attending: Emergency Medicine | Admitting: Emergency Medicine

## 2022-06-29 DIAGNOSIS — R197 Diarrhea, unspecified: Secondary | ICD-10-CM | POA: Insufficient documentation

## 2022-06-29 DIAGNOSIS — R519 Headache, unspecified: Secondary | ICD-10-CM | POA: Insufficient documentation

## 2022-06-29 DIAGNOSIS — R112 Nausea with vomiting, unspecified: Secondary | ICD-10-CM | POA: Diagnosis not present

## 2022-06-29 DIAGNOSIS — Z1152 Encounter for screening for COVID-19: Secondary | ICD-10-CM | POA: Diagnosis not present

## 2022-06-29 DIAGNOSIS — D72829 Elevated white blood cell count, unspecified: Secondary | ICD-10-CM | POA: Diagnosis not present

## 2022-06-29 DIAGNOSIS — R1084 Generalized abdominal pain: Secondary | ICD-10-CM | POA: Diagnosis not present

## 2022-06-29 LAB — LIPASE, BLOOD: Lipase: 29 U/L (ref 11–51)

## 2022-06-29 LAB — COMPREHENSIVE METABOLIC PANEL
ALT: 21 U/L (ref 0–44)
AST: 16 U/L (ref 15–41)
Albumin: 3.7 g/dL (ref 3.5–5.0)
Alkaline Phosphatase: 76 U/L (ref 38–126)
Anion gap: 7 (ref 5–15)
BUN: 12 mg/dL (ref 6–20)
CO2: 24 mmol/L (ref 22–32)
Calcium: 9.2 mg/dL (ref 8.9–10.3)
Chloride: 105 mmol/L (ref 98–111)
Creatinine, Ser: 0.7 mg/dL (ref 0.44–1.00)
GFR, Estimated: >60 mL/min (ref >60)
Glucose, Bld: 95 mg/dL (ref 70–99)
Potassium: 4.5 mmol/L (ref 3.5–5.1)
Sodium: 136 mmol/L (ref 135–145)
Total Bilirubin: 0.3 mg/dL (ref 0.3–1.2)
Total Protein: 7.1 g/dL (ref 6.5–8.1)

## 2022-06-29 LAB — CBC
HCT: 42.1 % (ref 36.0–46.0)
Hemoglobin: 13.6 g/dL (ref 12.0–15.0)
MCH: 28.6 pg (ref 26.0–34.0)
MCHC: 32.3 g/dL (ref 30.0–36.0)
MCV: 88.6 fL (ref 80.0–100.0)
Platelets: 331 10*3/uL (ref 150–400)
RBC: 4.75 MIL/uL (ref 3.87–5.11)
RDW: 13 % (ref 11.5–15.5)
WBC: 11.5 10*3/uL — ABNORMAL HIGH (ref 4.0–10.5)
nRBC: 0 % (ref 0.0–0.2)

## 2022-06-29 LAB — URINALYSIS, ROUTINE W REFLEX MICROSCOPIC
Bilirubin Urine: NEGATIVE
Glucose, UA: NEGATIVE mg/dL
Hgb urine dipstick: NEGATIVE
Ketones, ur: NEGATIVE mg/dL
Leukocytes,Ua: NEGATIVE
Nitrite: NEGATIVE
Protein, ur: NEGATIVE mg/dL
Specific Gravity, Urine: >=1.03 (ref 1.005–1.030)
pH: 5.5 (ref 5.0–8.0)

## 2022-06-29 LAB — RESP PANEL BY RT-PCR (RSV, FLU A&B, COVID)  RVPGX2
Influenza A by PCR: NEGATIVE
Influenza B by PCR: NEGATIVE
Resp Syncytial Virus by PCR: NEGATIVE
SARS Coronavirus 2 by RT PCR: NEGATIVE

## 2022-06-29 LAB — PREGNANCY, URINE: Preg Test, Ur: NEGATIVE

## 2022-06-29 MED ORDER — DIPHENHYDRAMINE HCL 50 MG/ML IJ SOLN
25.0000 mg | Freq: Once | INTRAMUSCULAR | Status: AC
  Administered 2022-06-29: 25 mg via INTRAVENOUS
  Filled 2022-06-29: qty 1

## 2022-06-29 MED ORDER — METOCLOPRAMIDE HCL 5 MG/ML IJ SOLN
10.0000 mg | Freq: Once | INTRAMUSCULAR | Status: AC
  Administered 2022-06-29: 10 mg via INTRAVENOUS
  Filled 2022-06-29: qty 2

## 2022-06-29 MED ORDER — ONDANSETRON 4 MG PO TBDP
4.0000 mg | ORAL_TABLET | Freq: Once | ORAL | Status: AC | PRN
  Administered 2022-06-29: 4 mg via ORAL
  Filled 2022-06-29: qty 1

## 2022-06-29 MED ORDER — SODIUM CHLORIDE 0.9 % IV BOLUS
1000.0000 mL | Freq: Once | INTRAVENOUS | Status: AC
  Administered 2022-06-29: 1000 mL via INTRAVENOUS

## 2022-06-29 NOTE — Discharge Instructions (Signed)
Please read and follow all provided instructions.  Your diagnoses today include:  1. Nausea and vomiting, unspecified vomiting type   2. Acute nonintractable headache, unspecified headache type     Tests performed today include: Blood cell counts and platelets: Minimally high white blood cell count Kidney and liver function tests: No problems Pancreas function test (called lipase): no problems Urine test to look for infection: No signs of a urinary infection A blood or urine test for pregnancy (women only) Vital signs. See below for your results today.   Medications prescribed:  None  Take any prescribed medications only as directed.  Home care instructions:  Follow any educational materials contained in this packet.  Follow-up instructions: Please follow-up with your primary care provider in the next 3 days for further evaluation of your symptoms.    Return instructions:  SEEK IMMEDIATE MEDICAL ATTENTION IF: The pain does not go away or becomes severe  A temperature above 101F develops  Repeated vomiting occurs (multiple episodes)  The pain becomes localized to portions of the abdomen. The right side could possibly be appendicitis. In an adult, the left lower portion of the abdomen could be colitis or diverticulitis.  Blood is being passed in stools or vomit (bright red or black tarry stools)  You develop chest pain, difficulty breathing, dizziness or fainting, or become confused, poorly responsive, or inconsolable (young children) If you have any other emergent concerns regarding your health  Additional Information: Abdominal (belly) pain can be caused by many things. Your caregiver performed an examination and possibly ordered blood/urine tests and imaging (CT scan, x-rays, ultrasound). Many cases can be observed and treated at home after initial evaluation in the emergency department. Even though you are being discharged home, abdominal pain can be unpredictable. Therefore,  you need a repeated exam if your pain does not resolve, returns, or worsens. Most patients with abdominal pain don't have to be admitted to the hospital or have surgery, but serious problems like appendicitis and gallbladder attacks can start out as nonspecific pain. Many abdominal conditions cannot be diagnosed in one visit, so follow-up evaluations are very important.  Your vital signs today were: BP 105/70   Pulse 70   Temp 98.3 F (36.8 C)   Resp 18   Ht 5' 8"$  (1.727 m)   Wt (!) 165 kg   SpO2 97%   BMI 55.31 kg/m  If your blood pressure (bp) was elevated above 135/85 this visit, please have this repeated by your doctor within one month. --------------

## 2022-06-29 NOTE — ED Provider Notes (Signed)
[facility] Provider Note   CSN: [id number] Arrival date & time: 06/29/22  1110     History  Chief Complaint  Patient presents with   Vomiting    Kaitlyn Luna is a 32 y.o. female.  Patient with history of headache disorder, followed by neurology, normal MRI about a month ago --presents to the emergency department for two days of nausea, vomiting, and diarrhea.  She has had some generalized abdominal pain.  She also has increasing headache.  She has had difficulty keeping down fluids.  She takes Topamax for headache prevention.  She states that she has had nausea, vomiting, diarrhea in the past which she typically will get with a UTI.  This will make her headaches worse and more difficult to control.  She is currently concerned about a UTI but denies dysuria, increased frequency or urgency, hematuria.  No weakness, numbness, tingling in the arms or legs.  No speech difficulties.  No balance problems.  She did not hit her head and denies trauma.       Home Medications Prior to Admission medications   Medication Sig Start Date End Date Taking? Authorizing Provider  albuterol (VENTOLIN HFA) 108 (90 Base) MCG/ACT inhaler Inhale 2 puffs into the lungs every 6 (six) hours as needed. 04/07/22   [provider]  Etonogestrel (NEXPLANON [place]) Inject into the skin.    [provider]  mupirocin ointment (BACTROBAN) 2 % Apply 1 Application topically 2 (two) times daily. 04/17/22   Lauree Chandler, NP  SUMAtriptan (IMITREX) 50 MG tablet Take 1 tablet (50 mg total) by mouth as needed for headache (take as needed for your headaches, try to take near onset of a severe headache to keep it from progressing). May repeat in 2 hours if headache persists or recurs. 04/17/22   Lauree Chandler, NP  topiramate (TOPAMAX) 50 MG tablet TAKE 1/2 TABLET BY MOUTH AT BEDTIME FOR 1 WEEK. INCREASE TO 1 TABLET AT BEDTIME 05/12/22   Tomi Likens, Adam R, DO   venlafaxine XR (EFFEXOR-XR) 37.5 MG 24 hr capsule TAKE 1 CAPSULE(37.5 MG) BY MOUTH DAILY WITH BREAKFAST 06/11/22   Lauree Chandler, NP      Allergies    Lavender oil    Review of Systems   Review of Systems  Physical Exam Updated Vital Signs BP 105/70   Pulse 70   Temp 98.3 F (36.8 C)   Resp 18   Ht 5' 8"$  (1.727 m)   Wt (!) 165 kg   SpO2 97%   BMI 55.31 kg/m   Physical Exam Vitals and nursing note reviewed.  Constitutional:      General: She is not in acute distress.    Appearance: She is well-developed.  HENT:     Head: Normocephalic and atraumatic.     Right Ear: External ear normal.     Left Ear: External ear normal.     Nose: Nose normal.     Mouth/Throat:     Pharynx: Uvula midline.  Eyes:     General: Lids are normal.     Extraocular Movements:     Right eye: No nystagmus.     Left eye: No nystagmus.     Conjunctiva/sclera: Conjunctivae normal.     Pupils: Pupils are equal, round, and reactive to light.  Cardiovascular:     Rate and Rhythm: Normal rate and regular rhythm.     Heart sounds: No murmur heard. Pulmonary:  Effort: Pulmonary effort is normal. No respiratory distress.     Breath sounds: Normal breath sounds. No wheezing, rhonchi or rales.  Abdominal:     Palpations: Abdomen is soft.     Tenderness: There is abdominal tenderness. There is no guarding or rebound.     Comments: Mild generalized tenderness without rebound or guarding.  Musculoskeletal:     Cervical back: Normal range of motion and neck supple. No tenderness or bony tenderness.     Right lower leg: No edema.     Left lower leg: No edema.  Skin:    General: Skin is warm and dry.     Findings: No rash.  Neurological:     General: No focal deficit present.     Mental Status: She is alert and oriented to person, place, and time. Mental status is at baseline.     GCS: GCS eye subscore is 4. GCS verbal subscore is 5. GCS motor subscore is 6.     Cranial Nerves: Cranial nerves  2-12 are intact. No cranial nerve deficit.     Sensory: No sensory deficit.     Motor: No weakness.     Coordination: Coordination normal.     Gait: Gait normal.     Comments: Upper extremity myotomes tested bilaterally:  C5 Shoulder abduction 5/5 C6 Elbow flexion/wrist extension 5/5 C7 Elbow extension 5/5 C8 Finger flexion 5/5 T1 Finger abduction 5/5  Lower extremity myotomes tested bilaterally: L2 Hip flexion 5/5 L3 Knee extension 5/5 L4 Ankle dorsiflexion 5/5 S1 Ankle plantar flexion 5/5   Psychiatric:        Mood and Affect: Mood normal.     ED Results / Procedures / Treatments   Labs (all labs ordered are listed, but only abnormal results are displayed) Labs Reviewed  CBC - Abnormal; Notable for the following components:      Result Value   WBC 11.5 (*)    All other components within normal limits  URINALYSIS, ROUTINE W REFLEX MICROSCOPIC - Abnormal; Notable for the following components:   APPearance HAZY (*)    All other components within normal limits  RESP PANEL BY RT-PCR (RSV, FLU A&B, COVID)  RVPGX2  LIPASE, BLOOD  COMPREHENSIVE METABOLIC PANEL  PREGNANCY, URINE    EKG None  Radiology No results found.  Procedures Procedures    Medications Ordered in ED Medications  ondansetron (ZOFRAN-ODT) disintegrating tablet 4 mg (4 mg Oral Given 06/29/22 1128)  metoCLOPramide (REGLAN) injection 10 mg (10 mg Intravenous Given 06/29/22 1510)  sodium chloride 0.9 % bolus 1,000 mL (0 mLs Intravenous Stopped 06/29/22 1641)  diphenhydrAMINE (BENADRYL) injection 25 mg (25 mg Intravenous Given 06/29/22 1509)    ED Course/ Medical Decision Making/ A&P    Patient seen and examined. History obtained directly from patient.  Also reviewed recent neurology notes and previous ED visits.  Reviewed previous MRI results.  Labs/EKG: Independently reviewed and interpreted.  This included: CBC with differential with minimally elevated white blood cell count at 11.5, normal  hemoglobin; CMP unremarkable; lipase normal; pregnancy negative; UA concentrated but without signs of infection.  Imaging: None ordered  Medications/Fluids: Ordered: IV fluid bolus, Reglan, Benadryl   Initial impression: Headache, nausea, vomiting, diarrhea with nonfocal exam.  No red flags for headache.  We did discuss CT imaging, however will treat symptoms first and see if this helps her feel better.  4:56 PM Reassessment performed. Patient appears improved.  Headache is nearly resolved after treatment with migraine cocktail.  Agrees to  defer imaging at this time.  She has nausea medication at home.  Reviewed pertinent lab work and imaging with patient at bedside. Questions answered.   Most current vital signs reviewed and are as follows: BP 114/66 (BP Location: Right Wrist)   Pulse 68   Temp 98.1 F (36.7 C) (Oral)   Resp 16   Ht 5' 8"$  (1.727 m)   Wt (!) 165 kg   SpO2 98%   BMI 55.31 kg/m   Plan: Discharge to home.   Prescriptions written for: None  ED return instructions discussed: The patient was urged to return to the Emergency Department immediately with worsening of current symptoms, worsening abdominal pain, persistent vomiting, blood noted in stools, fever, or any other concerns. The patient verbalized understanding.   Patient counseled to return if they have weakness in their arms or legs, slurred speech, trouble walking or talking, confusion, trouble with their balance, or if they have any other concerns. Patient verbalizes understanding and agrees with plan.   Follow-up instructions discussed: Patient encouraged to follow-up with their PCP in 3 days.                             Medical Decision Making Amount and/or Complexity of Data Reviewed Labs: ordered.  Risk Prescription drug management.   For this patient's complaint of abdominal pain, the following conditions were considered on the differential diagnosis: gastritis/PUD, enteritis/duodenitis,  appendicitis, cholelithiasis/cholecystitis, cholangitis, pancreatitis, ruptured viscus, colitis, diverticulitis, small/large bowel obstruction, proctitis, cystitis, pyelonephritis, ureteral colic, aortic dissection, aortic aneurysm. In women, ectopic pregnancy, pelvic inflammatory disease, ovarian cysts, and tubo-ovarian abscess were also considered. Atypical chest etiologies were also considered including ACS, PE, and pneumonia.  In regards to the patient's headache, critical differentials were considered including subarachnoid hemorrhage, intracerebral hemorrhage, epidural/subdural hematoma, pituitary apoplexy, vertebral/carotid artery dissection, giant cell arteritis, central venous thrombosis, reversible cerebral vasoconstriction, acute angle closure glaucoma, idiopathic intracranial hypertension, bacterial meningitis, viral encephalitis, carbon monoxide poisoning, posterior reversible encephalopathy syndrome, pre-eclampsia.   Reg flag symptoms related to these causes were considered including systemic symptoms (fever, weight loss), neurologic symptoms (confusion, mental status change, vision change, associated seizure), acute or sudden "thunderclap" onset, patient age 52 or older with new or progressive headache, patient of any age with first headache or change in headache pattern, pregnant or postpartum status, history of HIV or other immunocompromise, history of cancer, headache occurring with exertion, associated neck or shoulder pain, associated traumatic injury, concurrent use of anticoagulation, family history of spontaneous SAH, and concurrent drug use.    Other benign, more common causes of headache were considered including migraine, tension-type headache, cluster headache, referred pain from other cause such as sinus infection, dental pain, trigeminal neuralgia.   On exam, patient has a reassuring neuro exam including baseline mental status, no significant neck pain or meningeal signs, no  signs of severe infection or fever.   The patient's vital signs, pertinent lab work and imaging were reviewed and interpreted as discussed in the ED course. Hospitalization was considered for further testing, treatments, or serial exams/observation. However as patient is well-appearing, has a stable exam over the course of their evaluation, and reassuring studies today, I do not feel that they warrant admission at this time. This plan was discussed with the patient who verbalizes agreement and comfort with this plan and seems reliable and able to return to the Emergency Department with worsening or changing symptoms.  Final Clinical Impression(s) / ED Diagnoses Final diagnoses:  Nausea and vomiting, unspecified vomiting type  Acute nonintractable headache, unspecified headache type    Rx / DC Orders ED Discharge Orders     None         Carlisle Cater, PA-C 06/29/22 1701    Fredia Sorrow, MD 07/01/22 1419

## 2022-06-29 NOTE — ED Triage Notes (Signed)
Since Saturday c/o N/V/D. States not able to tolerate PO. Also having abdominal pain.

## 2022-06-30 ENCOUNTER — Encounter: Payer: Self-pay | Admitting: Nurse Practitioner

## 2022-06-30 ENCOUNTER — Telehealth: Payer: Self-pay

## 2022-06-30 NOTE — Transitions of Care (Post Inpatient/ED Visit) (Signed)
   06/30/2022  Name: Kaitlyn Luna MRN: [id number] DOB: December 01, 1990  Today's TOC FU Call Status:  Completed 06/30/2222  Transition Care Management Follow-up Telephone Call: patient scheduled for 07/08/2022 @ 2 pm    Items Reviewed: patient states that she is not feeling better and that the hospital did not give her anything for the nausea and vomiting. Patient states she was told this nausea and vomiting was due to her chronic headaches.   Home Care and Equipment/Supplies:  No home supplies were needed  Functional Questionnaire:  independent  Folllow up appointments reviewed:  Appointment made 07/08/2022    SIGNATURE: [facility]

## 2022-07-08 ENCOUNTER — Ambulatory Visit: Payer: BC Managed Care – PPO | Admitting: Family Medicine

## 2022-07-14 ENCOUNTER — Ambulatory Visit: Payer: BC Managed Care – PPO | Admitting: Primary Care

## 2022-07-14 ENCOUNTER — Telehealth: Payer: Self-pay | Admitting: Primary Care

## 2022-07-14 NOTE — Telephone Encounter (Signed)
Inlab study order automatically cancelled.  New split night study was placed today.  Not sure if Amy still needs this message to document - she states left vm for pt to call office.  Will route back to Amy.

## 2022-07-14 NOTE — Progress Notes (Deleted)
$'@Patient'i$  ID: Kaitlyn Luna, female    DOB: 1990/08/30, 32 y.o.   MRN: [id number]  No chief complaint on file.   Referring provider: Lauree Chandler, NP  HPI: 32 year old female, former smoker quit in 2017.  Past medical history significant for PTSD, major depressive disorder, morbid obesity, insomnia, CIN 2, heavy periods.    Previous [facility] pulmonary encounter:  06/02/2022 Presents today for sleep consult. History of sleep apnea. She previously had difficulty tolerating CPAP. She is open to resuming if needed. She has symptoms of migraine headaches, difficulty concentrating, loud snoring and daytime fatigue. She reports speaking with an [place] accent unintentionally at time especially when tired. She works from home for [employer]. She is having's difficulty at work, complains of issues with mental acuity. She wakes up with vertigo symptoms, headache last all day. She is seeing neurology, scheduled from MRI brian. She has PTSD and experiences frequently nightmares. She has a psychiatrist whom she last saw a couple months back and therapist. She stopped taking Prozac because she did not like how it made her feel. She is not currently on any psych medication. She has insomnia symptoms. She takes '10mg'$  Melatonin at bedtime as needed which helps her fall asleep.  Denies symptoms of narcolepsy, cataplexy or sleepwalking.  Sleep questionnaire Symptoms-   snoring, insomnia, difficulty concentrating, daytime sleepiness Prior sleep study- yes Bedtime-9:30-10pm Time to fall asleep- 30 mins Nocturnal awakenings- 2-3 times Out of bed/start of day- 6:30-7am Weight changes- no Do you operate heavy machinery- no Do you currently wear CPAP- no Do you current wear oxygen- no Epworth- 16    07/14/2022 Patient presents today for sleep follow-up. She has hx OSA, insomnia and RLS.  Split night sleep study has not been completed       Allergies  Allergen Reactions   Lavender Oil Hives     Immunization History  Administered Date(s) Administered   HPV 9-valent 02/24/2017   PFIZER(Purple Top)SARS-COV-2 Vaccination 07/20/2019, 08/10/2019   Tdap 01/08/2018    Past Medical History:  Diagnosis Date   Anemia    Anxiety    Asthma    Bipolar depression ([place])    Depression    Kidney stone    Pneumonia 2017   PTSD (post-traumatic stress disorder)    Recurrent UTI    Sleep apnea    not using c-pap    Tobacco History: Social History   Tobacco Use  Smoking Status Former   Types: Cigarettes   Quit date: 12/07/2015   Years since quitting: 6.6  Smokeless Tobacco Never   Counseling given: Not Answered   Outpatient Medications Prior to Visit  Medication Sig Dispense Refill   albuterol (VENTOLIN HFA) 108 (90 Base) MCG/ACT inhaler Inhale 2 puffs into the lungs every 6 (six) hours as needed.     Etonogestrel (NEXPLANON [place]) Inject into the skin.     mupirocin ointment (BACTROBAN) 2 % Apply 1 Application topically 2 (two) times daily. 22 g 0   SUMAtriptan (IMITREX) 50 MG tablet Take 1 tablet (50 mg total) by mouth as needed for headache (take as needed for your headaches, try to take near onset of a severe headache to keep it from progressing). May repeat in 2 hours if headache persists or recurs. 30 tablet 0   topiramate (TOPAMAX) 50 MG tablet TAKE 1/2 TABLET BY MOUTH AT BEDTIME FOR 1 WEEK. INCREASE TO 1 TABLET AT BEDTIME 30 tablet 0   venlafaxine XR (EFFEXOR-XR) 37.5 MG 24 hr capsule TAKE 1 CAPSULE(37.5  MG) BY MOUTH DAILY WITH BREAKFAST 30 capsule 1   No facility-administered medications prior to visit.      Review of Systems  Review of Systems   Physical Exam  There were no vitals taken for this visit. Physical Exam   Lab Results:  CBC    Component Value Date/Time   WBC 11.5 (H) 06/29/2022 1133   RBC 4.75 06/29/2022 1133   HGB 13.6 06/29/2022 1133   HCT 42.1 06/29/2022 1133   PLT 331 06/29/2022 1133   MCV 88.6 06/29/2022 1133   MCH 28.6 06/29/2022  1133   MCHC 32.3 06/29/2022 1133   RDW 13.0 06/29/2022 1133   LYMPHSABS 2.8 04/06/2022 1051   MONOABS 0.9 04/06/2022 1051   EOSABS 0.6 (H) 04/06/2022 1051   BASOSABS 0.1 04/06/2022 1051    BMET    Component Value Date/Time   NA 136 06/29/2022 1133   K 4.5 06/29/2022 1133   CL 105 06/29/2022 1133   CO2 24 06/29/2022 1133   GLUCOSE 95 06/29/2022 1133   BUN 12 06/29/2022 1133   CREATININE 0.70 06/29/2022 1133   CREATININE 0.71 06/10/2021 1507   CALCIUM 9.2 06/29/2022 1133   GFRNONAA >60 06/29/2022 1133   GFRAA >90 07/28/2013 2056    BNP No results found for: "BNP"  ProBNP No results found for: "PROBNP"  Imaging: No results found.   Assessment & Plan:   No problem-specific Assessment & Plan notes found for this encounter.     Martyn Ehrich, NP 07/14/2022

## 2022-07-14 NOTE — Addendum Note (Signed)
Addended by: Martyn Ehrich on: 07/14/2022 10:27 AM   Modules accepted: Orders

## 2022-07-14 NOTE — Telephone Encounter (Signed)
Left message for patient to call back regarding if patient had the sleep study performed.  Per Kaitlyn Luna, please reschedule appointment for today and reschedule after patient has the sleep test. Order was placed 06/02/22 and was discontinued. Tried to call patient yesterday, 07/13/2022 as well with no answer and left message for pt to call clinic.

## 2022-07-14 NOTE — Telephone Encounter (Signed)
Was her sleep study ever done or set up? She has restless leg symptoms and hx sleep apnea. I ordered it to be done in-lab. I just re-entered it, sorry if that messes you up- I apologize

## 2022-07-28 ENCOUNTER — Ambulatory Visit (INDEPENDENT_AMBULATORY_CARE_PROVIDER_SITE_OTHER): Payer: BC Managed Care – PPO | Admitting: Family Medicine

## 2022-07-28 ENCOUNTER — Encounter: Payer: Self-pay | Admitting: Family Medicine

## 2022-07-28 ENCOUNTER — Other Ambulatory Visit: Payer: Self-pay

## 2022-07-28 VITALS — BP 122/85 | HR 81 | Ht 67.0 in | Wt 371.8 lb

## 2022-07-28 DIAGNOSIS — Z3046 Encounter for surveillance of implantable subdermal contraceptive: Secondary | ICD-10-CM | POA: Diagnosis not present

## 2022-07-28 MED ORDER — ETONOGESTREL 68 MG ~~LOC~~ IMPL
68.0000 mg | DRUG_IMPLANT | Freq: Once | SUBCUTANEOUS | Status: AC
  Administered 2022-07-28: 68 mg via SUBCUTANEOUS

## 2022-07-28 NOTE — Progress Notes (Signed)
     GYNECOLOGY OFFICE PROCEDURE NOTE  Chistina Jadwin is a 32 y.o. G0P0000 here for Nexplanon removal and insertion.  Last pap smear was:    Component Value Date/Time   DIAGPAP  05/28/2021 1129    - Negative for intraepithelial lesion or malignancy (NILM)   DIAGPAP - Benign reactive/reparative changes 08/14/2019 1218   DIAGPAP (A) 02/01/2017 0000    -  LOW GRADE SQUAMOUS INTRAEPITHELIAL LESION: CIN-1/ HPV (LSIL)   DIAGPAP (A) 02/01/2017 0000    THERE ARE A FEW CELLS SUGGESTIVE OF A HIGHER GRADE LESION. CLINICAL CORRELATION IS RECOMMENDED.   [place] Negative 05/28/2021 1129   [place] Negative 08/14/2019 1218   ADEQPAP  05/28/2021 1129    Satisfactory for evaluation; transformation zone component PRESENT.   ADEQPAP  08/14/2019 1218    Satisfactory but limited for evaluation with partially obscuring   ADEQPAP  08/14/2019 1218    inflammation; transformation zone component present.   Has concern for DUB, very interested in a hysterectomy. Will have her schedule follow up visit with Gyn surgeon.   Nexplanon removal and insertion Procedure Patient identified, informed consent performed, consent signed.   Patient does understand that irregular bleeding is a very common side effect of this medication. Appropriate time out taken. Nexplanon site identified. Area prepped in usual sterile fashon. One ml of 1% lidocaine was used to anesthetize the area at the distal end of the implant. A small stab incision was made right beside the implant on the distal portion. The Nexplanon rod was grasped using hemostats and removed without difficulty. There was minimal blood loss. There were no complications. Area was then injected with 3 ml of 1 % lidocaine. She was re-prepped with betadine, Nexplanon removed from packaging, Device confirmed in needle, then inserted full length of needle and withdrawn per handbook instructions. Nexplanon was able to palpated in the patient's arm; patient palpated the insert  herself.  There was minimal blood loss. Patient insertion site covered with guaze and a pressure bandage to reduce any bruising. The patient tolerated the procedure well and was given post procedure instructions.  She was advised to have backup contraception for one week.    Clarnce Flock, MD/MPH Attending Family Medicine Physician, [facility] for [facility] [place]

## 2022-07-29 ENCOUNTER — Encounter: Payer: Self-pay | Admitting: Family Medicine

## 2022-07-29 ENCOUNTER — Encounter: Payer: Self-pay | Admitting: Family

## 2022-07-29 ENCOUNTER — Ambulatory Visit (INDEPENDENT_AMBULATORY_CARE_PROVIDER_SITE_OTHER): Payer: BC Managed Care – PPO | Admitting: Family Medicine

## 2022-07-29 ENCOUNTER — Ambulatory Visit (INDEPENDENT_AMBULATORY_CARE_PROVIDER_SITE_OTHER): Payer: BC Managed Care – PPO | Admitting: Family

## 2022-07-29 VITALS — BP 128/80 | HR 81 | Temp 98.1°F | Resp 16 | Ht 67.0 in | Wt 373.8 lb

## 2022-07-29 VITALS — BP 110/82 | HR 91 | Ht 67.0 in | Wt 369.1 lb

## 2022-07-29 DIAGNOSIS — G43009 Migraine without aura, not intractable, without status migrainosus: Secondary | ICD-10-CM | POA: Diagnosis not present

## 2022-07-29 DIAGNOSIS — R4781 Slurred speech: Secondary | ICD-10-CM

## 2022-07-29 DIAGNOSIS — Z3046 Encounter for surveillance of implantable subdermal contraceptive: Secondary | ICD-10-CM
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
## 2022-07-29 NOTE — Progress Notes (Signed)
   GYNECOLOGY PROBLEM  VISIT ENCOUNTER NOTE  Subjective:   Toshiba Heit is a 32 y.o. G0P0000 female here for a problem GYN visit.  Current complaints: ***.   Denies abnormal vaginal bleeding, discharge, pelvic pain, problems with intercourse or other gynecologic concerns.    Gynecologic History Patient's last menstrual period was 06/11/2022 (exact date).  Contraception: {method:5051}  Health Maintenance Due  Topic Date Due   Hepatitis C Screening  Never done   HPV VACCINES (2 - 3-dose SCDM series) 03/24/2017    The following portions of the patient's history were reviewed and updated as appropriate: allergies, current medications, past family history, past medical history, past social history, past surgical history and problem list.  Review of Systems {ros; complete:30496}   Objective:  BP 110/82   Pulse 91   Ht 5\' 7"  (1.702 m)   Wt (!) 369 lb 1.6 oz (167.4 kg)   LMP 06/11/2022 (Exact Date)   BMI 57.81 kg/m  Gen: well appearing, NAD HEENT: no scleral icterus CV: RR Lung: Normal WOB Ext: warm well perfused  PELVIC: Normal appearing external genitalia; normal appearing vaginal mucosa and cervix.  No abnormal discharge noted.  ***Pap smear obtained.  Normal uterine size, no other palpable masses, no uterine or adnexal tenderness.   Assessment and Plan:  Applied steristrips to wound which looks uninfected  Please refer to After Visit Summary for other counseling recommendations.   No follow-ups on file.  Caren Macadam, MD, MPH, ABFM Attending [place] for [facility]

## 2022-07-30 ENCOUNTER — Encounter: Payer: Self-pay | Admitting: Family Medicine

## 2022-08-05 ENCOUNTER — Encounter: Payer: Self-pay | Admitting: Nurse Practitioner

## 2022-08-05 ENCOUNTER — Ambulatory Visit (INDEPENDENT_AMBULATORY_CARE_PROVIDER_SITE_OTHER): Payer: BC Managed Care – PPO | Admitting: Nurse Practitioner

## 2022-08-05 VITALS — BP 128/86 | HR 74 | Temp 97.8°F | Resp 18 | Ht 67.0 in | Wt 370.0 lb

## 2022-08-05 DIAGNOSIS — G4733 Obstructive sleep apnea (adult) (pediatric): Secondary | ICD-10-CM | POA: Diagnosis not present

## 2022-08-05 DIAGNOSIS — G43009 Migraine without aura, not intractable, without status migrainosus: Secondary | ICD-10-CM

## 2022-08-05 NOTE — Progress Notes (Signed)
Careteam: Patient Care Team: Lauree Chandler, NP as PCP - General (Geriatric Medicine)  PLACE OF SERVICE:  [place] Directive information Does Patient Have a Medical Advance Directive?: No, Would patient like information on creating a medical advance directive?: No - Patient declined  Allergies  Allergen Reactions   Capulin    Chief Complaint  Patient presents with   Acute Visit    Patient wants to discuss paperwork.      HPI: Patient is a 32 y.o. female for paperwork.  She was in last week and had paperwork complete but needs more information added to paperwork.  Since December she is having headaches, with facial numbness, slurred speech and irish accent. Reports she can not think when she is having these headaches. Is incapacitated for days due to pain from headaches  She has been referred to neurology.  Her sumatriptam helps symptoms when they get bad  The ubrelvy made symptoms worse.  She has pain management appt tomorrow.   Review of Systems:  Review of Systems  Constitutional:  Positive for malaise/fatigue. Negative for chills, fever and weight loss.  Neurological:  Positive for sensory change and headaches.  Psychiatric/Behavioral:         Increase In stress    Past Medical History:  Diagnosis Date   Anemia    Anxiety    Asthma    Bipolar depression ([place])    Depression    Headaches, cluster 04/2022   Kidney stone    Pneumonia 2017   PTSD (post-traumatic stress disorder)    Recurrent UTI    Sleep apnea    not using c-pap   Past Surgical History:  Procedure Laterality Date   BREAST SURGERY  2013   REDUCTI0N   CARPAL TUNNEL RELEASE Left    CERVICAL CONIZATION W/BX N/A 05/27/2017   Procedure: CONIZATION CERVIX WITH BIOPSY - COLD KNIFE;  Surgeon: Emily Filbert, MD;  Location: [facility];  Service: Gynecology;  Laterality: N/A;   KNEE ARTHROSCOPY Left    TONSILLECTOMY     Social History:   reports that she quit smoking about  6 years ago. Her smoking use included cigarettes. She has never used smokeless tobacco. She reports that she does not currently use alcohol. She reports that she does not use drugs.  Family History  Problem Relation Age of Onset   Arthritis Mother    Diabetes Father    Alzheimer's disease Father    Alzheimer's disease Maternal Grandfather    Stroke Paternal Grandmother    Dementia Paternal Grandfather     Medications: Patient's Medications  New Prescriptions   No medications on file  Previous Medications   ALBUTEROL (VENTOLIN HFA) 108 (90 BASE) MCG/ACT INHALER    Inhale 2 puffs into the lungs every 6 (six) hours as needed.   ETONOGESTREL (NEXPLANON [place])    Inject into the skin.   MUPIROCIN OINTMENT (BACTROBAN) 2 %    Apply 1 Application topically 2 (two) times daily.   SUMATRIPTAN (IMITREX) 50 MG TABLET    Take 1 tablet (50 mg total) by mouth as needed for headache (take as needed for your headaches, try to take near onset of a severe headache to keep it from progressing). May repeat in 2 hours if headache persists or recurs.  Modified Medications   No medications on file  Discontinued Medications   No medications on file    Physical Exam:  Vitals:   08/05/22 1521  BP: 128/86  Pulse: 74  Resp: 18  Temp: 97.8 F (36.6 C)  SpO2: 98%  Weight: (!) 370 lb (167.8 kg)  Height: 5\' 7"  (1.702 m)   Body mass index is 57.95 kg/m. Wt Readings from Last 3 Encounters:  08/05/22 (!) 370 lb (167.8 kg)  07/29/22 (!) 373 lb 12.8 oz (169.6 kg)  07/29/22 (!) 369 lb 1.6 oz (167.4 kg)    Physical Exam Constitutional:      Appearance: Normal appearance.  Pulmonary:     Effort: Pulmonary effort is normal.  Neurological:     Mental Status: She is alert. Mental status is at baseline.  Psychiatric:        Mood and Affect: Mood normal.     Labs reviewed: Basic Metabolic Panel: Recent Labs    03/22/22 1340 04/06/22 1051 04/17/22 1120 06/29/22 1133  NA 136 140  --  136  K 4.2 4.4   --  4.5  CL 107 108  --  105  CO2 20* 23  --  24  GLUCOSE 94 96  --  95  BUN 7 7  --  12  CREATININE 0.71 0.85  --  0.70  CALCIUM 8.9 9.3  --  9.2  TSH  --   --  1.04  --    Liver Function Tests: Recent Labs    03/22/22 1340 04/06/22 1051 06/29/22 1133  AST 14* 17 16  ALT 18 17 21   ALKPHOS 68 73 76  BILITOT 0.2* 0.4 0.3  PROT 7.2 6.9 7.1  ALBUMIN 3.8 3.7 3.7   Recent Labs    03/22/22 1340 06/29/22 1133  LIPASE 38 29   No results for input(s): "AMMONIA" in the last 8760 hours. CBC: Recent Labs    03/22/22 1340 04/06/22 1051 06/29/22 1133  WBC 6.8 12.4* 11.5*  NEUTROABS 3.6 7.9*  --   HGB 14.4 14.0 13.6  HCT 43.2 43.3 42.1  MCV 88.0 89.6 88.6  PLT 320 370 331   Lipid Panel: Recent Labs    04/17/22 1120  CHOL 146  HDL 38*  LDLCALC 87  TRIG 115  CHOLHDL 3.8   TSH: Recent Labs    04/17/22 1120  TSH 1.04   A1C: Lab Results  Component Value Date   HGBA1C 5.5 05/28/2021     Assessment/Plan 1. Migraine without aura and without status migrainosus, not intractable -FMLA paperwork complete.  - she has pain management appt tomorrow to help manage pain from chronic migraines, continues to follow up with neurology as well.   2. OSA (obstructive sleep apnea) -encouraged to follow up with pulmonary to schedule study    To keep follow up on file.  Carlos American. [place], [facility] [phone]

## 2022-08-21 ENCOUNTER — Ambulatory Visit
Admission: EM | Admit: 2022-08-21 | Discharge: 2022-08-21 | Disposition: A | Discharge status: Home / Self Care | Payer: BC Managed Care – PPO | Attending: Urgent Care | Admitting: Urgent Care

## 2022-08-21 DIAGNOSIS — B001 Herpesviral vesicular dermatitis: Secondary | ICD-10-CM

## 2022-08-21 DIAGNOSIS — J018 Other acute sinusitis: Secondary | ICD-10-CM

## 2022-08-21 DIAGNOSIS — J309 Allergic rhinitis, unspecified: Secondary | ICD-10-CM

## 2022-08-21 LAB — POCT RAPID STREP A (OFFICE): Rapid Strep A Screen: NEGATIVE

## 2022-08-21 MED ORDER — CETIRIZINE HCL 10 MG PO TABS: 10.0000 mg | ORAL_TABLET | Freq: Every day | ORAL | 0 refills | Status: DC

## 2022-08-21 MED ORDER — AMOXICILLIN 500 MG PO CAPS: 500.0000 mg | ORAL_CAPSULE | Freq: Three times a day (TID) | ORAL | 0 refills | Status: DC

## 2022-08-21 MED ORDER — VALACYCLOVIR HCL 1 G PO TABS: 1000.0000 mg | ORAL_TABLET | Freq: Three times a day (TID) | ORAL | 0 refills | Status: DC

## 2022-08-21 MED ORDER — PROMETHAZINE-DM 6.25-15 MG/5ML PO SYRP: 5.0000 mL | ORAL_SOLUTION | Freq: Three times a day (TID) | ORAL | 0 refills | Status: DC | PRN

## 2022-08-21 MED ORDER — PSEUDOEPHEDRINE HCL 60 MG PO TABS: 60.0000 mg | ORAL_TABLET | Freq: Three times a day (TID) | ORAL | 0 refills | Status: DC | PRN

## 2022-08-21 NOTE — ED Triage Notes (Signed)
Pt reports sore throat, ear pain, nasal congestion and cough x 3 days.  Pt not taken any meds for complaints.

## 2022-08-21 NOTE — Discharge Instructions (Signed)
Start valacyclovir for the cold sore. We will manage this as a sinus infection with amoxicillin. For sore throat or cough try using a honey-based tea. Use 3 teaspoons of honey with juice squeezed from half lemon. Place shaved pieces of ginger into 1/2-1 cup of water and warm over stove top. Then mix the ingredients and repeat every 4 hours as needed. Please take ibuprofen 600mg  every 6 hours with food alternating with OR taken together with Tylenol 500mg -650mg  every 6 hours for throat pain, fevers, aches and pains. Hydrate very well with at least 2 liters of water. Eat light meals such as soups (chicken and noodles, vegetable, chicken and wild rice).  Do not eat foods that you are allergic to.  Taking an antihistamine like Zyrtec can help against postnasal drainage, sinus congestion which can cause sinus pain, sinus headaches, throat pain, painful swallowing, coughing.  You can take this together with pseudoephedrine (Sudafed) at a dose of 60 mg 3 times a day or twice daily as needed for the same kind of nasal drip, congestion.  Use cough medication as needed.

## 2022-08-21 NOTE — ED Provider Notes (Signed)
[facility]  Note:  This document was prepared using Conservation officer, historic buildings and may include unintentional dictation errors.  MRN: [id number] DOB: 1990/10/16  Subjective:   Kaitlyn Luna is a 32 y.o. female presenting for 3-day history of acute onset persistent worsening moderate to severe throat pain, left ear pain, sinus fullness, congestion, drainage, coughing.  She is also developed a very painful sore on the bottom middle of her lip.  Has been putting Chapstick on it.  Has a history of asthma, does not have any issues with it currently. No smoking of any kind including cigarettes, cigars, vaping, marijuana use.    No current facility-administered medications for this encounter.  Current Outpatient Medications:    albuterol (VENTOLIN HFA) 108 (90 Base) MCG/ACT inhaler, Inhale 2 puffs into the lungs every 6 (six) hours as needed., Disp: , Rfl:    Etonogestrel (NEXPLANON [place]), Inject into the skin., Disp: , Rfl:    mupirocin ointment (BACTROBAN) 2 %, Apply 1 Application topically 2 (two) times daily., Disp: 22 g, Rfl: 0   SUMAtriptan (IMITREX) 50 MG tablet, Take 1 tablet (50 mg total) by mouth as needed for headache (take as needed for your headaches, try to take near onset of a severe headache to keep it from progressing). May repeat in 2 hours if headache persists or recurs., Disp: 30 tablet, Rfl: 0   Allergies  Allergen Reactions   Lavender Oil Hives    Past Medical History:  Diagnosis Date   Anemia    Anxiety    Asthma    Bipolar depression    Depression    Headaches, cluster 04/2022   Kidney stone    Pneumonia 2017   PTSD (post-traumatic stress disorder)    Recurrent UTI    Sleep apnea    not using c-pap     Past Surgical History:  Procedure Laterality Date   BREAST SURGERY  2013   REDUCTI0N   CARPAL TUNNEL RELEASE Left    CERVICAL CONIZATION W/BX N/A 05/27/2017   Procedure: CONIZATION CERVIX WITH BIOPSY - COLD KNIFE;  Surgeon:  Allie Bossier, MD;  Location: [facility];  Service: Gynecology;  Laterality: N/A;   KNEE ARTHROSCOPY Left    TONSILLECTOMY      Family History  Problem Relation Age of Onset   Arthritis Mother    Diabetes Father    Alzheimer's disease Father    Alzheimer's disease Maternal Grandfather    Stroke Paternal Grandmother    Dementia Paternal Grandfather     Social History   Tobacco Use   Smoking status: Former    Types: Cigarettes    Quit date: 12/07/2015    Years since quitting: 6.7   Smokeless tobacco: Never  Vaping Use   Vaping Use: Never used  Substance Use Topics   Alcohol use: Not Currently    Comment: occ   Drug use: No    ROS   Objective:   Vitals: BP 124/84 (BP Location: Left Arm)   Pulse 93   Temp 99.6 F (37.6 C) (Oral)   Resp 18   LMP  (Exact Date)   SpO2 100%   Physical Exam Constitutional:      General: She is not in acute distress.    Appearance: Normal appearance. She is well-developed and normal weight. She is not ill-appearing, toxic-appearing or diaphoretic.  HENT:     Head: Normocephalic and atraumatic.     Right Ear: Tympanic membrane, ear canal and external ear  normal. No drainage or tenderness. No middle ear effusion. There is no impacted cerumen. Tympanic membrane is not erythematous or bulging.     Left Ear: Ear canal and external ear normal. No drainage or tenderness.  No middle ear effusion. There is no impacted cerumen. Tympanic membrane is not erythematous or bulging.     Ears:     Comments: Effusion of left TM.     Nose: Congestion and rhinorrhea present.     Mouth/Throat:     Mouth: Mucous membranes are moist. No oral lesions.     Pharynx: Posterior oropharyngeal erythema present. No pharyngeal swelling, oropharyngeal exudate or uvula swelling.     Tonsils: No tonsillar exudate or tonsillar abscesses.   Eyes:     General: No scleral icterus.       Right eye: No discharge.        Left eye: No discharge.     Extraocular Movements:  Extraocular movements intact.     Right eye: Normal extraocular motion.     Left eye: Normal extraocular motion.     Conjunctiva/sclera: Conjunctivae normal.  Cardiovascular:     Rate and Rhythm: Normal rate and regular rhythm.     Heart sounds: Normal heart sounds. No murmur heard.    No friction rub. No gallop.  Pulmonary:     Effort: Pulmonary effort is normal. No respiratory distress.     Breath sounds: No stridor. No wheezing, rhonchi or rales.  Chest:     Chest wall: No tenderness.  Musculoskeletal:     Cervical back: Normal range of motion and neck supple.  Lymphadenopathy:     Cervical: No cervical adenopathy.  Skin:    General: Skin is warm and dry.  Neurological:     General: No focal deficit present.     Mental Status: She is alert and oriented to person, place, and time.  Psychiatric:        Mood and Affect: Mood normal.        Behavior: Behavior normal.    Results for orders placed or performed during the hospital encounter of 08/21/22 (from the past 24 hour(s))  POCT rapid strep A     Status: None   Collection Time: 08/21/22  8:59 AM  Result Value Ref Range   Rapid Strep A Screen Negative Negative    Assessment and Plan :   PDMP not reviewed this encounter.  1. Acute non-recurrent sinusitis of other sinus   2. Cold sore   3. Allergic rhinitis, unspecified seasonality, unspecified trigger     Start valacyclovir for the culture.  Will treat sinusitis with amoxicillin for 7 days.  Suspect underlying allergic rhinitis and recommended medications for this.  Use pseudoephedrine as needed. Deferred imaging given clear cardiopulmonary exam, hemodynamically stable vital signs.  Counseled patient on potential for adverse effects with medications prescribed/recommended today, ER and return-to-clinic precautions discussed, patient verbalized understanding.    Wallis Bamberg, [place] 08/21/22 [phone]

## 2022-08-27 ENCOUNTER — Encounter: Payer: Self-pay | Admitting: Obstetrics and Gynecology

## 2022-08-27 ENCOUNTER — Other Ambulatory Visit: Payer: Self-pay

## 2022-08-27 ENCOUNTER — Ambulatory Visit (INDEPENDENT_AMBULATORY_CARE_PROVIDER_SITE_OTHER): Payer: BC Managed Care – PPO | Admitting: Obstetrics and Gynecology

## 2022-08-27 VITALS — BP 128/88 | HR 86 | Wt 361.3 lb

## 2022-08-27 DIAGNOSIS — N926 Irregular menstruation, unspecified: Secondary | ICD-10-CM

## 2022-08-27 DIAGNOSIS — N939 Abnormal uterine and vaginal bleeding, unspecified: Secondary | ICD-10-CM | POA: Diagnosis not present

## 2022-08-27 DIAGNOSIS — Z803 Family history of malignant neoplasm of breast: Secondary | ICD-10-CM

## 2022-08-27 NOTE — Progress Notes (Signed)
GYNECOLOGY VISIT  Patient name: Kaitlyn Luna MRN [id number]  Date of birth: 04-Dec-1990 Chief Complaint:   Gynecologic Exam  History:  Kaitlyn Luna is a 32 y.o. G0P0000 being seen today for AUB and dysmenorrhea.    May go a whole year without bleeding. Having to get birth control every 3 years. Does not want children. Without the nexplanon she has significant bleeding and pain. Has already had nexplanon consistently. Not sexually active currently.   Has pain with intercourse - more recent but reports it has always been painful. No prior therapy to address pain. Not very interested in intercourse currently   Patient has irregular menses. Has nexplanon since 32 years old. Typically has no bleeding but when she switches may have heavy bleeding.   Low back pain that shoots to her stomach when she is bleeding. Denies other CPP other than with intercourse.   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  Past Medical History:  Diagnosis Date   Anemia    Anxiety    Asthma    Bipolar depression    Depression    Headaches, cluster 04/2022   Kidney stone    Pneumonia 2017   PTSD (post-traumatic stress disorder)    Recurrent UTI    Sleep apnea    not using c-pap    Past Surgical History:  Procedure Laterality Date   BREAST SURGERY  2013   REDUCTI0N   CARPAL TUNNEL RELEASE Left    CERVICAL CONIZATION W/BX N/A 05/27/2017   Procedure: CONIZATION CERVIX WITH BIOPSY - COLD KNIFE;  Surgeon: Allie Bossier, MD;  Location: [facility];  Service: Gynecology;  Laterality: N/A;   KNEE ARTHROSCOPY Left    TONSILLECTOMY      The following portions of the patient's history were reviewed and updated as appropriate: allergies, current medications, past family history, past medical history, past social history, past surgical history and problem list.   Health Maintenance:   Last pap     Component Value Date/Time   DIAGPAP  05/28/2021 1129    - Negative for  intraepithelial lesion or malignancy (NILM)   DIAGPAP - Benign reactive/reparative changes 08/14/2019 1218   DIAGPAP (A) 02/01/2017 0000    -  LOW GRADE SQUAMOUS INTRAEPITHELIAL LESION: CIN-1/ HPV (LSIL)   DIAGPAP (A) 02/01/2017 0000    THERE ARE A FEW CELLS SUGGESTIVE OF A HIGHER GRADE LESION. CLINICAL CORRELATION IS RECOMMENDED.   HPVHIGH Negative 05/28/2021 1129   HPVHIGH Negative 08/14/2019 1218   ADEQPAP  05/28/2021 1129    Satisfactory for evaluation; transformation zone component PRESENT.   ADEQPAP  08/14/2019 1218    Satisfactory but limited for evaluation with partially obscuring   ADEQPAP  08/14/2019 1218    inflammation; transformation zone component present.    Last mammogram: n/a   Review of Systems:  Pertinent items are noted in HPI. Comprehensive review of systems was otherwise negative.   Objective:  Physical Exam BP 128/88   Pulse 86   Wt (!) 361 lb 4.8 oz (163.9 kg)   LMP 06/11/2022 (Exact Date)   BMI 56.59 kg/m    Physical Exam Vitals and nursing note reviewed.  Constitutional:      Appearance: Normal appearance.  HENT:     Head: Normocephalic and atraumatic.  Pulmonary:     Effort: Pulmonary effort is normal.  Skin:    General: Skin is warm and dry.  Neurological:     General: No focal deficit  present.     Mental Status: She is alert.  Psychiatric:        Mood and Affect: Mood normal.        Behavior: Behavior normal.        Thought Content: Thought content normal.        Judgment: Judgment normal.      Labs and Imaging CLINICAL DATA:  Irregular menses, history of ovarian cysts, LMP 12/11/2019, has Nexplanon   EXAM: TRANSABDOMINAL AND TRANSVAGINAL ULTRASOUND OF PELVIS   TECHNIQUE: Both transabdominal and transvaginal ultrasound examinations of the pelvis were performed. Transabdominal technique was performed for global imaging of the pelvis including uterus, ovaries, adnexal regions, and pelvic cul-de-sac. It was necessary to  proceed with endovaginal exam following the transabdominal exam to visualize the uterus, endometrium, and ovaries.   COMPARISON:  11/01/2015   FINDINGS: Uterus   Measurements: 6.4 x 3.4 x 4.1 cm = volume: 46 mL. Anteverted. Heterogeneous myometrium. Few scattered areas of shadowing. No discrete uterine mass.   Endometrium   Thickness: 3 mm.  No endometrial fluid or mass   Right ovary   Measurements: 1.6 x 1.4 x 1.7 cm = volume: 1.9 mL. Normal morphology without mass   Left ovary   Measurements: 2.6 x 1.7 x 1.7 cm = volume: 3.9 mL. Normal morphology without mass   Other findings   No free pelvic fluid or adnexal masses. Trace nonspecific endocervical canal fluid.   IMPRESSION: Unremarkable ovaries, adnexa, and endometrial complex.   Trace nonspecific endocervical fluid.   No focal uterine abnormalities.     Electronically Signed   By: Ulyses Southward M.D.   On: 06/04/2021 12:01     Assessment & Plan:   1. Irregular periods 2. Abnormal uterine bleeding (AUB) Discussed possible hysterectomy for definitive management. Patient not 100% sure she would like to continue with nexplanon for now since she just had it placed and has good effect when in place. Considering hysterectomy either when next due or in her 76s. Reviewed would need EMB prior to surgery. Briefly reviewed risks, benefits, and alternatives. Denies anesthetic complications previously. Reviewed that given her hx of CIN II, would need to continue pap smears post hysterectomy for 25 yrs (from dx) per ASCCP guidelines.   3. Family history of breast cancer Notes having multiple family members with breast, colon, and ovarian cancer and has not had prior genetic testing. Will complete empower testing today. Stated she had already had a mastectomy due to FH of breast cancer.  - Empower Multi-Cancer (2+38)  I spent 25 minutes with this patient completing history, chart review, discussion of recommendations and  surgery as well as documentation.    Routine preventative health maintenance measures emphasized.  Lorriane Shire, MD [facility], [facility]

## 2022-09-06 LAB — EMPOWER MULTI-CANCER (2 + 38): REPORT SUMMARY: NEGATIVE

## 2022-09-09 ENCOUNTER — Ambulatory Visit (HOSPITAL_BASED_OUTPATIENT_CLINIC_OR_DEPARTMENT_OTHER): Payer: BC Managed Care – PPO | Admitting: Pulmonary Disease

## 2022-09-16 ENCOUNTER — Encounter: Payer: Self-pay | Admitting: Family

## 2022-09-16 ENCOUNTER — Other Ambulatory Visit: Payer: Self-pay | Admitting: Family

## 2022-09-16 ENCOUNTER — Ambulatory Visit (INDEPENDENT_AMBULATORY_CARE_PROVIDER_SITE_OTHER): Payer: BC Managed Care – PPO | Admitting: Family

## 2022-09-16 VITALS — BP 134/86 | HR 75 | Temp 98.0°F | Resp 20 | Ht 67.0 in | Wt 366.0 lb

## 2022-09-16 DIAGNOSIS — R058 Other specified cough: Secondary | ICD-10-CM

## 2022-09-16 DIAGNOSIS — J3489 Other specified disorders of nose and nasal sinuses: Secondary | ICD-10-CM

## 2022-09-16 DIAGNOSIS — J4521 Mild intermittent asthma with (acute) exacerbation: Secondary | ICD-10-CM

## 2022-09-16 MED ORDER — PREDNISONE 20 MG PO TABS: ORAL_TABLET | ORAL | 0 refills | Status: AC

## 2022-09-16 MED ORDER — CETIRIZINE HCL 10 MG PO TABS: 10.0000 mg | ORAL_TABLET | Freq: Every day | ORAL | 0 refills | Status: DC

## 2022-09-16 MED ORDER — ALBUTEROL SULFATE HFA 108 (90 BASE) MCG/ACT IN AERS: 2.0000 | INHALATION_SPRAY | Freq: Four times a day (QID) | RESPIRATORY_TRACT | 0 refills | Status: DC | PRN

## 2022-09-16 NOTE — Patient Instructions (Signed)
-   Gurgle throat with warm water and salt at least once daily   - take OTC tylenol as needed for pain   - Encouraged to increase fluid intake/warm tea or soup   - Notify provider if symptoms worsen or fail to improve  

## 2022-09-16 NOTE — Progress Notes (Signed)
Provider: Azarria Balint FNP-C  Sharon Seller, NP  Patient Care Team: Sharon Seller, NP as PCP - General (Geriatric Medicine)  Extended Emergency Contact Information Primary Emergency Contact: Little,Edd Home Phone: [phone] Mobile Phone: [phone] Relation: Grandfather Preferred language: English Interpreter needed? No  Code Status:  Full Code  Goals of care: Advanced Directive information    08/05/2022    3:22 PM  Advanced Directives  Does Patient Have a Medical Advance Directive? No  Would patient like information on creating a medical advance directive? No - Patient declined     Chief Complaint  Patient presents with   Acute Visit    Patient is being seen for cough and congestion/ sore throat since Monday Need refill on inhaler medication    HPI:  Pt is a 32 y.o. female seen today for an acute visit for evaluation of cough,congestion and sore throat x 3 days.Had fever and chills.States Temp was 101 on yesterday.Took Theraflu. States woke up could not breath out of the nose. Has taken Sinux mucus relief.  Appetite not there.feels nauseous then want to throw up. Has not been exposed to anyone sick with COVID-19.works from home. Also request refills on albuterol inhaler.states was diagnosed with Asthma when living in [place].  Request FMLA paper work to filled due to being sick unable to talk on the phone.States will return to work on 09/22/2022.  Past Medical History:  Diagnosis Date   Anemia    Anxiety    Asthma    Bipolar depression (HCC)    Depression    Headaches, cluster 04/2022   Kidney stone    Pneumonia 2017   PTSD (post-traumatic stress disorder)    Recurrent UTI    Sleep apnea    not using c-pap   Past Surgical History:  Procedure Laterality Date   BREAST SURGERY  2013   REDUCTI0N   CARPAL TUNNEL RELEASE Left    CERVICAL CONIZATION W/BX N/A 05/27/2017   Procedure: CONIZATION CERVIX WITH BIOPSY - COLD KNIFE;  Surgeon:  Allie Bossier, MD;  Location: [facility];  Service: Gynecology;  Laterality: N/A;   KNEE ARTHROSCOPY Left    TONSILLECTOMY      Allergies  Allergen Reactions   Lavender Oil Hives    Outpatient Encounter Medications as of 09/16/2022  Medication Sig   albuterol (VENTOLIN HFA) 108 (90 Base) MCG/ACT inhaler Inhale 2 puffs into the lungs every 6 (six) hours as needed.   cetirizine (ZYRTEC ALLERGY) 10 MG tablet Take 1 tablet (10 mg total) by mouth daily.   Etonogestrel (NEXPLANON [place]) Inject into the skin.   FLUoxetine (PROZAC) 20 MG capsule Take 20 mg by mouth daily.   mupirocin ointment (BACTROBAN) 2 % Apply 1 Application topically 2 (two) times daily.   promethazine-dextromethorphan (PROMETHAZINE-DM) 6.25-15 MG/5ML syrup Take 5 mLs by mouth 3 (three) times daily as needed for cough.   pseudoephedrine (SUDAFED) 60 MG tablet Take 1 tablet (60 mg total) by mouth every 8 (eight) hours as needed for congestion.   SUMAtriptan (IMITREX) 50 MG tablet Take 1 tablet (50 mg total) by mouth as needed for headache (take as needed for your headaches, try to take near onset of a severe headache to keep it from progressing). May repeat in 2 hours if headache persists or recurs.   valACYclovir (VALTREX) 1000 MG tablet Take 1 tablet (1,000 mg total) by mouth 3 (three) times daily.   [DISCONTINUED] amoxicillin (AMOXIL) 500 MG capsule Take 1 capsule (500 mg total)  by mouth 3 (three) times daily. (Patient not taking: Reported on 08/27/2022)   No facility-administered encounter medications on file as of 09/16/2022.    Review of Systems  Constitutional:  Positive for appetite change, chills and fever. Negative for fatigue and unexpected weight change.  HENT:  Positive for congestion, postnasal drip, rhinorrhea and sore throat. Negative for dental problem, ear discharge, ear pain, facial swelling, hearing loss, nosebleeds, sinus pressure, sinus pain, sneezing, tinnitus and trouble swallowing.   Eyes:  Negative for pain,  discharge, redness, itching and visual disturbance.  Respiratory:  Positive for cough. Negative for chest tightness, shortness of breath and wheezing.   Cardiovascular:  Negative for chest pain, palpitations and leg swelling.  Gastrointestinal:  Negative for abdominal distention, abdominal pain, constipation, diarrhea, nausea and vomiting.  Genitourinary:  Negative for difficulty urinating, dysuria, flank pain, frequency and urgency.  Musculoskeletal:  Negative for arthralgias, back pain, gait problem, joint swelling, myalgias, neck pain and neck stiffness.  Skin:  Negative for color change, pallor, rash and wound.  Neurological:  Negative for dizziness, weakness, light-headedness and headaches.    Immunization History  Administered Date(s) Administered   HPV 9-valent 02/24/2017   PFIZER(Purple Top)SARS-COV-2 Vaccination 07/20/2019, 08/10/2019   Tdap 01/08/2018   Pertinent  Health Maintenance Due  Topic Date Due   INFLUENZA VACCINE  12/10/2022   PAP SMEAR-Modifier  05/28/2024      11/17/2021    6:36 PM 11/21/2021    6:22 PM 03/23/2022   12:22 AM 04/06/2022    9:56 AM 08/05/2022    3:22 PM  Fall Risk  Falls in the past year?     0  Was there an injury with Fall?     0  Fall Risk Category Calculator     0  (RETIRED) Patient Fall Risk Level Low fall risk Low fall risk Low fall risk Moderate fall risk   Patient at Risk for Falls Due to     No Fall Risks  Fall risk Follow up     Falls evaluation completed   Functional Status Survey:    Vitals:   09/16/22 1245  BP: 134/86  Pulse: 75  Resp: 20  Temp: 98 F (36.7 C)  SpO2: 98%  Weight: (!) 366 lb (166 kg)  Height: 5\' 7"  (1.702 m)   Body mass index is 57.32 kg/m. Physical Exam Vitals reviewed.  Constitutional:      General: She is not in acute distress.    Appearance: Normal appearance. She is morbidly obese. She is not ill-appearing or diaphoretic.  HENT:     Head: Normocephalic.     Right Ear: Tympanic membrane, ear  canal and external ear normal. There is no impacted cerumen.     Left Ear: Tympanic membrane, ear canal and external ear normal. There is no impacted cerumen.     Nose: Nose normal. No congestion or rhinorrhea.     Mouth/Throat:     Mouth: Mucous membranes are moist.     Pharynx: Oropharynx is clear. No oropharyngeal exudate or posterior oropharyngeal erythema.  Eyes:     General: No scleral icterus.       Right eye: No discharge.        Left eye: No discharge.     Extraocular Movements: Extraocular movements intact.     Conjunctiva/sclera: Conjunctivae normal.     Pupils: Pupils are equal, round, and reactive to light.  Neck:     Vascular: No carotid bruit.  Cardiovascular:  Rate and Rhythm: Normal rate and regular rhythm.     Pulses: Normal pulses.     Heart sounds: Normal heart sounds. No murmur heard.    No friction rub. No gallop.  Pulmonary:     Effort: Pulmonary effort is normal. No respiratory distress.     Breath sounds: Examination of the right-upper field reveals wheezing. Examination of the left-upper field reveals wheezing. Examination of the left-lower field reveals decreased breath sounds. Decreased breath sounds and wheezing present. No rhonchi or rales.  Chest:     Chest wall: No tenderness.  Abdominal:     General: Bowel sounds are normal. There is no distension.     Palpations: Abdomen is soft. There is no mass.     Tenderness: There is no abdominal tenderness. There is no right CVA tenderness, left CVA tenderness, guarding or rebound.  Musculoskeletal:        General: No swelling or tenderness. Normal range of motion.     Cervical back: Normal range of motion. No rigidity or tenderness.     Right lower leg: No edema.     Left lower leg: No edema.  Lymphadenopathy:     Cervical: No cervical adenopathy.  Skin:    General: Skin is warm and dry.     Coloration: Skin is not pale.     Findings: No erythema.  Neurological:     Mental Status: She is alert and  oriented to person, place, and time.     Sensory: No sensory deficit.     Motor: No weakness.     Gait: Gait normal.  Psychiatric:        Mood and Affect: Mood normal.        Speech: Speech normal.        Behavior: Behavior normal.     Labs reviewed: Recent Labs    03/22/22 1340 04/06/22 1051 06/29/22 1133  NA 136 140 136  K 4.2 4.4 4.5  CL 107 108 105  CO2 20* 23 24  GLUCOSE 94 96 95  BUN 7 7 12   CREATININE 0.71 0.85 0.70  CALCIUM 8.9 9.3 9.2   Recent Labs    03/22/22 1340 04/06/22 1051 06/29/22 1133  AST 14* 17 16  ALT 18 17 21   ALKPHOS 68 73 76  BILITOT 0.2* 0.4 0.3  PROT 7.2 6.9 7.1  ALBUMIN 3.8 3.7 3.7   Recent Labs    03/22/22 1340 04/06/22 1051 06/29/22 1133  WBC 6.8 12.4* 11.5*  NEUTROABS 3.6 7.9*  --   HGB 14.4 14.0 13.6  HCT 43.2 43.3 42.1  MCV 88.0 89.6 88.6  PLT 320 370 331   Lab Results  Component Value Date   TSH 1.04 04/17/2022   Lab Results  Component Value Date   HGBA1C 5.5 05/28/2021   Lab Results  Component Value Date   CHOL 146 04/17/2022   HDL 38 (L) 04/17/2022   LDLCALC 87 04/17/2022   TRIG 115 04/17/2022   CHOLHDL 3.8 04/17/2022    Significant Diagnostic Results in last 30 days:  No results found.  Assessment/Plan 1. Dry cough Afebrile  Bilateral lung wheezes noted  - prednisone taper as below  - Notify provider if symptoms worsen   2. Mild intermittent asthma with acute exacerbation Bilateral upper lobe wheezes with diminished bases.suspect possible exacerbation.will start on Prednisone taper as below.Has taken prednisone in the past with effectiveness.will add doxycycline if symptoms worsen or no improvement.  - refill albuterol inhaler - albuterol (VENTOLIN HFA)  108 (90 Base) MCG/ACT inhaler; Inhale 2 puffs into the lungs every 6 (six) hours as needed.  Dispense: 18 g; Refill: 0 - predniSONE (DELTASONE) 20 MG tablet; Take 2 tablets (40 mg total) by mouth daily with breakfast for 1 day, THEN 1.5 tablets (30 mg  total) daily with breakfast for 1 day, THEN 1 tablet (20 mg total) daily with breakfast for 1 day, THEN 0.5 tablets (10 mg total) daily with breakfast for 1 day.  Dispense: 5 tablet; Refill: 0  3. Rhinorrhea Samples given for 7 days Zyrtec - cetirizine (ZYRTEC ALLERGY) 10 MG tablet; Take 1 tablet (10 mg total) by mouth daily.  Dispense: 30 tablet; Refill: 0  Family/ staff Communication: Reviewed plan of care with patient verbalized understanding   Labs/tests ordered: None   Next Appointment: Return if symptoms worsen or fail to improve.    Caesar Bookman, NP

## 2022-10-01 ENCOUNTER — Encounter (HOSPITAL_BASED_OUTPATIENT_CLINIC_OR_DEPARTMENT_OTHER): Payer: BC Managed Care – PPO | Admitting: Pulmonary Disease
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
## 2022-10-21 ENCOUNTER — Encounter: Payer: Self-pay | Admitting: Neurology

## 2022-10-21 ENCOUNTER — Ambulatory Visit: Payer: BC Managed Care – PPO | Admitting: Neurology

## 2022-10-30 ENCOUNTER — Ambulatory Visit: Payer: BC Managed Care – PPO | Admitting: Nurse Practitioner

## 2022-12-04 ENCOUNTER — Encounter: Payer: Self-pay | Admitting: Adult Health

## 2022-12-04 ENCOUNTER — Ambulatory Visit: Payer: BC Managed Care – PPO | Admitting: Adult Health

## 2022-12-04 VITALS — BP 128/78 | HR 83 | Temp 97.7°F | Resp 18 | Ht 67.0 in | Wt 358.8 lb

## 2022-12-04 DIAGNOSIS — J4521 Mild intermittent asthma with (acute) exacerbation: Secondary | ICD-10-CM

## 2022-12-04 DIAGNOSIS — G8929 Other chronic pain: Secondary | ICD-10-CM

## 2022-12-04 DIAGNOSIS — M25512 Pain in left shoulder: Secondary | ICD-10-CM

## 2022-12-04 DIAGNOSIS — G4733 Obstructive sleep apnea (adult) (pediatric): Secondary | ICD-10-CM | POA: Diagnosis not present

## 2022-12-04 DIAGNOSIS — G44029 Chronic cluster headache, not intractable: Secondary | ICD-10-CM

## 2022-12-04 DIAGNOSIS — L52 Erythema nodosum: Secondary | ICD-10-CM

## 2022-12-04 MED ORDER — MUPIROCIN 2 % EX OINT: 1.0000 | TOPICAL_OINTMENT | Freq: Two times a day (BID) | CUTANEOUS | 0 refills | Status: AC
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
## 2022-12-14 ENCOUNTER — Other Ambulatory Visit (INDEPENDENT_AMBULATORY_CARE_PROVIDER_SITE_OTHER): Payer: BC Managed Care – PPO

## 2022-12-14 ENCOUNTER — Other Ambulatory Visit: Payer: Self-pay

## 2022-12-14 ENCOUNTER — Ambulatory Visit (INDEPENDENT_AMBULATORY_CARE_PROVIDER_SITE_OTHER): Payer: BC Managed Care – PPO | Admitting: Orthopedic Surgery

## 2022-12-14 DIAGNOSIS — M898X1 Other specified disorders of bone, shoulder: Secondary | ICD-10-CM

## 2022-12-14 DIAGNOSIS — M79602 Pain in left arm: Secondary | ICD-10-CM

## 2022-12-15 ENCOUNTER — Encounter: Payer: Self-pay | Admitting: Orthopedic Surgery
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
## 2022-12-16 ENCOUNTER — Other Ambulatory Visit: Payer: Self-pay

## 2022-12-16 DIAGNOSIS — M898X1 Other specified disorders of bone, shoulder: Secondary | ICD-10-CM

## 2022-12-16 DIAGNOSIS — M79602 Pain in left arm: Secondary | ICD-10-CM

## 2023-02-08 ENCOUNTER — Ambulatory Visit: Payer: BC Managed Care – PPO | Admitting: Adult Health

## 2023-03-19 ENCOUNTER — Other Ambulatory Visit: Payer: Self-pay | Admitting: Medical Genetics

## 2023-03-19 DIAGNOSIS — Z006 Encounter for examination for normal comparison and control in clinical research program: Secondary | ICD-10-CM

## 2023-04-13 ENCOUNTER — Other Ambulatory Visit (HOSPITAL_COMMUNITY): Payer: Medicaid Other | Attending: Medical Genetics

## 2023-04-19 ENCOUNTER — Ambulatory Visit (INDEPENDENT_AMBULATORY_CARE_PROVIDER_SITE_OTHER): Payer: Self-pay

## 2023-04-19 ENCOUNTER — Ambulatory Visit
Admission: RE | Admit: 2023-04-19 | Discharge: 2023-04-19 | Disposition: A | Discharge status: Home / Self Care | Payer: Self-pay | Source: Ambulatory Visit | Attending: Internal Medicine | Admitting: Internal Medicine

## 2023-04-19 VITALS — BP 137/84 | HR 82 | Temp 99.2°F

## 2023-04-19 DIAGNOSIS — J029 Acute pharyngitis, unspecified: Secondary | ICD-10-CM | POA: Insufficient documentation

## 2023-04-19 DIAGNOSIS — R0602 Shortness of breath: Secondary | ICD-10-CM | POA: Insufficient documentation

## 2023-04-19 DIAGNOSIS — B349 Viral infection, unspecified: Secondary | ICD-10-CM | POA: Insufficient documentation

## 2023-04-19 DIAGNOSIS — R051 Acute cough: Secondary | ICD-10-CM | POA: Diagnosis present

## 2023-04-19 DIAGNOSIS — J4521 Mild intermittent asthma with (acute) exacerbation: Secondary | ICD-10-CM | POA: Diagnosis present

## 2023-04-19 LAB — POC COVID19/FLU A&B COMBO
Covid Antigen, POC: NEGATIVE
Influenza A Antigen, POC: NEGATIVE
Influenza B Antigen, POC: NEGATIVE

## 2023-04-19 LAB — POCT RAPID STREP A (OFFICE): Rapid Strep A Screen: NEGATIVE

## 2023-04-19 MED ORDER — ALBUTEROL SULFATE HFA 108 (90 BASE) MCG/ACT IN AERS: 1.0000 | INHALATION_SPRAY | Freq: Four times a day (QID) | RESPIRATORY_TRACT | 0 refills | Status: AC | PRN

## 2023-04-19 MED ORDER — METHYLPREDNISOLONE ACETATE 80 MG/ML IJ SUSP
60.0000 mg | Freq: Once | INTRAMUSCULAR | Status: AC
  Administered 2023-04-19: 60 mg via INTRAMUSCULAR

## 2023-04-19 MED ORDER — FLUTICASONE PROPIONATE HFA 110 MCG/ACT IN AERO: 1.0000 | INHALATION_SPRAY | Freq: Two times a day (BID) | RESPIRATORY_TRACT | 0 refills | Status: DC

## 2023-04-19 MED ORDER — IPRATROPIUM-ALBUTEROL 0.5-2.5 (3) MG/3ML IN SOLN
3.0000 mL | Freq: Once | RESPIRATORY_TRACT | Status: AC
  Administered 2023-04-19: 3 mL via RESPIRATORY_TRACT

## 2023-04-19 NOTE — ED Provider Notes (Signed)
[facility]    CSN: [id number] Arrival date & time: 04/19/23  1018      History   Chief Complaint Chief Complaint  Patient presents with   Cough    Entered by patient    HPI Kaitlyn Luna is a 32 y.o. female  presents for evaluation of URI symptoms for 4 days. Patient reports associated symptoms of cough, congestion, sore throat, shortness of breath. Denies N/V/D, fevers, ear pain, body aches. Patient does have a hx of asthma.  Has an albuterol inhaler and has been using twice daily with minimal improvement.  Does not want to use more than this due to cost concern.  Patient does not have a history of smoking.  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  Pt has taken Tylenol cough/cold medicine OTC for symptoms. Pt has no other concerns at this time.    Cough Associated symptoms: shortness of breath and sore throat     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   Morbid obesity (HCC) 02/01/2017   PTSD (post-traumatic stress disorder) 07/26/2013   MDD (major depressive disorder), recurrent severe, 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    Past Surgical History:  Procedure Laterality Date   BREAST SURGERY  2013   REDUCTI0N   CARPAL TUNNEL RELEASE Left    CERVICAL CONIZATION W/BX N/A 05/27/2017   Procedure: CONIZATION CERVIX WITH BIOPSY - COLD KNIFE;  Surgeon: Allie Bossier, MD;  Location: [facility];  Service:  Gynecology;  Laterality: N/A;   KNEE ARTHROSCOPY Left    TONSILLECTOMY      OB History     Gravida  0   Para  0   Term  0   Preterm  0   AB  0   Living  0      SAB  0   IAB  0   Ectopic  0   Multiple  0   Live Births               Home Medications    Prior to Admission medications   Medication Sig Start Date End Date Taking? Authorizing Provider  albuterol (VENTOLIN HFA) 108 (90 Base) MCG/ACT inhaler Inhale 1-2 puffs into the lungs every 6 (six) hours as needed for wheezing or shortness of breath. 04/19/23  Yes Radford Pax, NP  fluticasone (FLOVENT HFA) 110 MCG/ACT inhaler Inhale 1 puff into the lungs in the morning and at bedtime. 04/19/23  Yes Radford Pax, NP  Etonogestrel [facility]) Inject into the skin.    [provider]  promethazine-dextromethorphan (PROMETHAZINE-DM) 6.25-15 MG/5ML syrup Take 5 mLs by mouth 3 (three) times daily as needed for cough. 08/21/22   Wallis Bamberg, PA-C  pseudoephedrine (SUDAFED) 60 MG tablet Take 1 tablet (60 mg total) by mouth every 8 (eight) hours as needed  for congestion. 08/21/22   Wallis Bamberg, PA-C  SUMAtriptan (IMITREX) 50 MG tablet Take 1 tablet (50 mg total) by mouth as needed for headache (take as needed for your headaches, try to take near onset of a severe headache to keep it from progressing). May repeat in 2 hours if headache persists or recurs. 04/17/22   Sharon Seller, NP    Family History Family History  Problem Relation Age of Onset   Arthritis Mother    Diabetes Father    Alzheimer's disease Father    Alzheimer's disease Maternal Grandfather    Stroke Paternal Grandmother    Dementia Paternal Grandfather     Social History Social History   Tobacco Use   Smoking status: Former    Current packs/day: 0.00    Types: Cigarettes    Quit date: 12/07/2015    Years since quitting: 7.3   Smokeless tobacco: Never  Vaping Use   Vaping status: Never Used  Substance Use Topics   Alcohol use:  Not Currently    Comment: occ   Drug use: No     Allergies   Lavender oil   Review of Systems Review of Systems  HENT:  Positive for congestion and sore throat.   Respiratory:  Positive for cough and shortness of breath.      Physical Exam Triage Vital Signs ED Triage Vitals  Encounter Vitals Group     BP 04/19/23 1041 137/84     Systolic BP Percentile --      Diastolic BP Percentile --      Pulse Rate 04/19/23 1041 82     Resp --      Temp 04/19/23 1041 99.2 F (37.3 C)     Temp Source 04/19/23 1041 Oral     SpO2 04/19/23 1041 96 %     Weight --      Height --      Head Circumference --      Peak Flow --      Pain Score 04/19/23 1040 6     Pain Loc --      Pain Education --      Exclude from Growth Chart --    No data found.  Updated Vital Signs BP 137/84 (BP Location: Right Arm)   Pulse 82   Temp 99.2 F (37.3 C) (Oral)   SpO2 98%   Visual Acuity Right Eye Distance:   Left Eye Distance:   Bilateral Distance:    Right Eye Near:   Left Eye Near:    Bilateral Near:     Physical Exam Vitals and nursing note reviewed.  Constitutional:      General: She is not in acute distress.    Appearance: She is well-developed. She is not ill-appearing.  HENT:     Head: Normocephalic and atraumatic.     Right Ear: Tympanic membrane and ear canal normal.     Left Ear: Tympanic membrane and ear canal normal.     Nose: Congestion present.     Mouth/Throat:     Mouth: Mucous membranes are moist.     Pharynx: Oropharynx is clear. Uvula midline. Posterior oropharyngeal erythema present.     Tonsils: No tonsillar exudate or tonsillar abscesses.  Eyes:     Conjunctiva/sclera: Conjunctivae normal.     Pupils: Pupils are equal, round, and reactive to light.  Cardiovascular:     Rate and Rhythm: Normal rate and regular rhythm.     Heart sounds: Normal heart sounds.  Pulmonary:     Effort: Pulmonary effort is normal.     Breath sounds: Normal breath sounds. No  wheezing or rhonchi.  Musculoskeletal:     Cervical back: Normal range of motion and neck supple.  Lymphadenopathy:     Cervical: No cervical adenopathy.  Skin:    General: Skin is warm and dry.  Neurological:     General: No focal deficit present.     Mental Status: She is alert and oriented to person, place, and time.  Psychiatric:        Mood and Affect: Mood normal.        Behavior: Behavior normal.      UC Treatments / Results  Labs (all labs ordered are listed, but only abnormal results are displayed) Labs Reviewed  CULTURE, GROUP A STREP [facility])  POCT RAPID STREP A (OFFICE)  POC COVID19/FLU A&B COMBO    EKG   Radiology DG Chest 2 View  Result Date: 04/19/2023 CLINICAL DATA:  Cough, shortness of breath. EXAM: CHEST - 2 VIEW COMPARISON:  January 05, 2019. FINDINGS: The heart size and mediastinal contours are within normal limits. Both lungs are clear. The visualized skeletal structures are unremarkable. IMPRESSION: No active cardiopulmonary disease. Electronically Signed   By: Lupita Raider M.D.   On: 04/19/2023 11:11    Procedures Procedures (including critical care time)  Medications Ordered in UC Medications  ipratropium-albuterol (DUONEB) 0.5-2.5 (3) MG/3ML nebulizer solution 3 mL (3 mLs Nebulization Given 04/19/23 1058)  methylPREDNISolone acetate (DEPO-MEDROL) injection 60 mg (60 mg Intramuscular Given 04/19/23 1130)    Initial Impression / Assessment and Plan / UC Course  I have reviewed the triage vital signs and the nursing notes.  Pertinent labs & imaging results that were available during my care of the patient were reviewed by me and considered in my medical decision making (see chart for details).  Clinical Course as of 04/19/23 1132  Mon Apr 19, 2023  1107 DG Chest 2 View [JM]    Clinical Course User Index [JM] Radford Pax, NP    Reviewed exam and symptom with patient.  No red flags.  Negative rapid strep, flu, COVID testing.  Will send  throat culture.  Chest x-ray negative for pneumonia.  Discussed viral illness with asthma exacerbation.  Patient reports improvement in symptoms after nebulizer.  She declined oral steroids stating they make her "angry" but was agreeable to a one-time Depo-Medrol injection which was given in clinic.  Refilled albuterol inhaler and also sent steroid inhaler.  Continue OTC treatments as needed.  PCP follow-up 2 to 3 days for recheck.  ER precautions reviewed. Final Clinical Impressions(s) / UC Diagnoses   Final diagnoses:  Sore throat  Acute cough  Shortness of breath  Mild intermittent asthma with acute exacerbation  Viral illness     Discharge Instructions      I have refilled you albuterol inhaler. You may use the steroid inhaler, Flovent as prescribed. Please treat your symptoms with over the counter cough medication, tylenol or ibuprofen, humidifier, and rest. Viral illnesses can last 7-14 days. Please follow up with your PCP if your symptoms are not improving. Please go to the ER for any worsening symptoms. This includes but is not limited to fever you can not control with tylenol or ibuprofen, you are not able to stay hydrated, you have shortness of breath or chest pain.  Thank you for choosing [facility] for your healthcare needs. I hope you feel better soon!  ED Prescriptions     Medication Sig Dispense Auth. Provider   albuterol (VENTOLIN HFA) 108 (90 Base) MCG/ACT inhaler Inhale 1-2 puffs into the lungs every 6 (six) hours as needed for wheezing or shortness of breath. 1 each Radford Pax, NP   fluticasone (FLOVENT HFA) 110 MCG/ACT inhaler Inhale 1 puff into the lungs in the morning and at bedtime. 1 each Radford Pax, NP      PDMP not reviewed this encounter.   Radford Pax, NP 04/19/23 [phone]

## 2023-04-19 NOTE — ED Triage Notes (Addendum)
Pt presents to UC for c/o productive cough, shortness of breath, chest pressure x4 days. Home remedies: Inhaler did not help, tylenol did not help, theraflu

## 2023-04-19 NOTE — Discharge Instructions (Addendum)
I have refilled you albuterol inhaler. You may use the steroid inhaler, Flovent as prescribed. Please treat your symptoms with over the counter cough medication, tylenol or ibuprofen, humidifier, and rest. Viral illnesses can last 7-14 days. Please follow up with your PCP if your symptoms are not improving. Please go to the ER for any worsening symptoms. This includes but is not limited to fever you can not control with tylenol or ibuprofen, you are not able to stay hydrated, you have shortness of breath or chest pain.  Thank you for choosing [facility] for your healthcare needs. I hope you feel better soon!

## 2023-04-22 LAB — CULTURE, GROUP A STREP (THRC)

## 2023-05-31 ENCOUNTER — Emergency Department (HOSPITAL_BASED_OUTPATIENT_CLINIC_OR_DEPARTMENT_OTHER): Admission: EM | Admit: 2023-05-31 | Discharge: 2023-05-31 | Discharge status: Against Medical Advice | Payer: Self-pay

## 2023-05-31 ENCOUNTER — Emergency Department: Payer: Self-pay

## 2023-05-31 ENCOUNTER — Ambulatory Visit
Admission: EM | Admit: 2023-05-31 | Discharge: 2023-05-31 | Disposition: A | Discharge status: Home / Self Care | Payer: Medicaid Other | Attending: Family Medicine | Admitting: Family Medicine

## 2023-05-31 ENCOUNTER — Other Ambulatory Visit: Payer: Self-pay

## 2023-05-31 DIAGNOSIS — B349 Viral infection, unspecified: Secondary | ICD-10-CM

## 2023-05-31 DIAGNOSIS — R509 Fever, unspecified: Secondary | ICD-10-CM

## 2023-05-31 DIAGNOSIS — N390 Urinary tract infection, site not specified: Secondary | ICD-10-CM

## 2023-05-31 LAB — POCT URINALYSIS DIP (MANUAL ENTRY)
Bilirubin, UA: NEGATIVE
Glucose, UA: NEGATIVE mg/dL
Ketones, POC UA: NEGATIVE mg/dL
Leukocytes, UA: NEGATIVE
Nitrite, UA: NEGATIVE
Protein Ur, POC: NEGATIVE mg/dL
Spec Grav, UA: >=1.03 — AB
Urobilinogen, UA: 1 U/dL
pH, UA: 5.5

## 2023-05-31 LAB — POC COVID19/FLU A&B COMBO
Covid Antigen, POC: NEGATIVE
Influenza A Antigen, POC: NEGATIVE
Influenza B Antigen, POC: NEGATIVE

## 2023-05-31 LAB — POCT URINE PREGNANCY: Preg Test, Ur: NEGATIVE

## 2023-05-31 MED ORDER — SULFAMETHOXAZOLE-TRIMETHOPRIM 800-160 MG PO TABS: 1.0000 | ORAL_TABLET | Freq: Two times a day (BID) | ORAL | 0 refills | Status: AC

## 2023-05-31 NOTE — ED Triage Notes (Addendum)
Provider in room for triage.   Pt presents with complaints of cough with green mucus, fever of 100.1 F, and generalized body aches. Pt is also reporting burning with urination and urinary frequency x 3 days. Reports one episode of vomiting and loose stools. Robitussin, Dayquil, and Tylenol are the only OTC medications taken.

## 2023-05-31 NOTE — Discharge Instructions (Addendum)
Over-the-counter medicines for symptom relief

## 2023-05-31 NOTE — ED Provider Notes (Signed)
[facility]    CSN: [id number] Arrival date & time: 05/31/23  1850      History   Chief Complaint Chief Complaint  Patient presents with   Cough   Fever   Generalized Body Aches   Urinary Frequency    HPI Kaitlyn Luna is a 33 y.o. female.    Cough Associated symptoms: chills, ear pain (right), fever, rhinorrhea, shortness of breath and sore throat   Associated symptoms: no chest pain and no diaphoresis   Fever Associated symptoms: chills, congestion, cough, diarrhea, dysuria, ear pain (right), rhinorrhea, sore throat and vomiting   Associated symptoms: no chest pain   Urinary Frequency Associated symptoms include shortness of breath. Pertinent negatives include no chest pain.  Not feeling well since he days symptoms include cough with some green mucus, body aches, fatigue, fever to 100.1, runny nose, stuffy nose, sore throat.  Admits today.  Also complaining of dysuria and frequency for 3 days without hematuria.  Last menstrual period 05/27/2023, came early only lasted 3 days.  Denies concern for STI, denies vaginal discharge or admits work contact with illness.  No prescription medicines.  Taking over-the-counter Robitussin, DayQuil and Tylenol.  Admits history of asthma. Denies confirmed COVID strep or flu exposures.  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   Morbid obesity (HCC) 02/01/2017   PTSD  (post-traumatic stress disorder) 07/26/2013   MDD (major depressive disorder), recurrent severe, 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    Past Surgical History:  Procedure Laterality Date   BREAST SURGERY  2013   [id number]   CARPAL TUNNEL RELEASE Left    CERVICAL CONIZATION W/BX N/A 05/27/2017   Procedure: CONIZATION CERVIX WITH BIOPSY - COLD KNIFE;  Surgeon: Allie Bossier, MD;  Location: [facility];  Service: Gynecology;  Laterality: N/A;   KNEE ARTHROSCOPY Left    TONSILLECTOMY      OB History     Gravida  0   Para  0   Term  0   Preterm  0   AB  0   Living  0      SAB  0   IAB  0   Ectopic  0   Multiple  0   Live Births               Home Medications    Prior to Admission medications   Medication Sig Start Date End Date Taking? Authorizing Provider  sulfamethoxazole-trimethoprim (BACTRIM DS) 800-160 MG tablet Take 1 tablet by mouth 2 (two) times daily for 3 days. 05/31/23 06/03/23 Yes Meliton Rattan, PA  albuterol (VENTOLIN HFA) 108 (90 Base) MCG/ACT inhaler Inhale 1-2 puffs into the lungs every 6 (six) hours as needed for wheezing or shortness of breath. 04/19/23   Radford Pax, NP  Etonogestrel [facility]) Inject into the skin.  [provider]  fluticasone (FLOVENT HFA) 110 MCG/ACT inhaler Inhale 1 puff into the lungs in the morning and at bedtime. 04/19/23   Radford Pax, NP  promethazine-dextromethorphan (PROMETHAZINE-DM) 6.25-15 MG/5ML syrup Take 5 mLs by mouth 3 (three) times daily as needed for cough. 08/21/22   Wallis Bamberg, PA-C  pseudoephedrine (SUDAFED) 60 MG tablet Take 1 tablet (60 mg total) by mouth every 8 (eight) hours as needed for congestion. 08/21/22   Wallis Bamberg, PA-C  SUMAtriptan (IMITREX) 50 MG tablet Take 1 tablet (50 mg total) by mouth as needed for headache (take as needed for your headaches, try to take near onset of a severe headache to keep it from progressing). May repeat in 2 hours if headache persists or recurs. 04/17/22    Sharon Seller, NP    Family History Family History  Problem Relation Age of Onset   Arthritis Mother    Diabetes Father    Alzheimer's disease Father    Alzheimer's disease Maternal Grandfather    Stroke Paternal Grandmother    Dementia Paternal Grandfather     Social History Social History   Tobacco Use   Smoking status: Former    Current packs/day: 0.00    Types: Cigarettes    Quit date: 12/07/2015    Years since quitting: 7.4   Smokeless tobacco: Never  Vaping Use   Vaping status: Never Used  Substance Use Topics   Alcohol use: Not Currently    Comment: occ   Drug use: No     Allergies   Lavender oil   Review of Systems Review of Systems  Constitutional:  Positive for appetite change, chills, fatigue and fever. Negative for diaphoresis.  HENT:  Positive for congestion, ear pain (right), rhinorrhea and sore throat. Negative for trouble swallowing.   Respiratory:  Positive for cough and shortness of breath.   Cardiovascular:  Negative for chest pain.  Gastrointestinal:  Positive for diarrhea and vomiting. Negative for blood in stool.  Genitourinary:  Positive for dysuria and frequency.     Physical Exam Triage Vital Signs ED Triage Vitals  Encounter Vitals Group     BP      Systolic BP Percentile      Diastolic BP Percentile      Pulse      Resp      Temp      Temp src      SpO2      Weight      Height      Head Circumference      Peak Flow      Pain Score      Pain Loc      Pain Education      Exclude from Growth Chart    No data found.  Updated Vital Signs BP 112/80 (BP Location: Right Arm)   Pulse 95   Temp (!) 101.5 F (38.6 C) (Oral)   Resp 18   LMP 05/27/2023 (Exact Date)   SpO2 95%   Visual Acuity Right Eye Distance:   Left Eye Distance:   Bilateral Distance:    Right Eye Near:   Left Eye Near:    Bilateral Near:     Physical Exam Vitals and nursing note reviewed.  Constitutional:      Appearance: She is obese.  She is not ill-appearing.  HENT:     Head: Normocephalic and atraumatic.     Right Ear: Tympanic membrane and ear canal normal.  Left Ear: Tympanic membrane and ear canal normal.     Nose: Congestion present. No rhinorrhea.     Mouth/Throat:     Mouth: Mucous membranes are moist.     Pharynx: No oropharyngeal exudate or posterior oropharyngeal erythema.  Eyes:     Conjunctiva/sclera: Conjunctivae normal.  Cardiovascular:     Rate and Rhythm: Normal rate and regular rhythm.     Heart sounds: Normal heart sounds.  Pulmonary:     Effort: No respiratory distress.     Breath sounds: Normal breath sounds. No wheezing, rhonchi or rales.  Abdominal:     Palpations: Abdomen is soft.     Tenderness: There is no abdominal tenderness. There is no right CVA tenderness or left CVA tenderness.  Musculoskeletal:     Cervical back: Neck supple.  Lymphadenopathy:     Cervical: No cervical adenopathy.  Skin:    General: Skin is warm and dry.  Neurological:     Mental Status: She is alert and oriented to person, place, and time.  Psychiatric:        Mood and Affect: Mood normal.      UC Treatments / Results  Labs (all labs ordered are listed, but only abnormal results are displayed) Labs Reviewed  POCT URINALYSIS DIP (MANUAL ENTRY) - Abnormal; Notable for the following components:      Result Value   Spec Grav, UA >=1.030 (*)    Blood, UA trace-lysed (*)    All other components within normal limits  POCT URINE PREGNANCY - Normal  POC COVID19/FLU A&B COMBO - Normal    EKG   Radiology No results found.  Procedures Procedures (including critical care time)  Medications Ordered in UC Medications - No data to display  Initial Impression / Assessment and Plan / UC Course  I have reviewed the triage vital signs and the nursing notes.  Pertinent labs & imaging results that were available during my care of the patient were reviewed by me and considered in my medical decision  making (see chart for details).     33 year old female with abrupt onset of illness x 1 day symptoms include fever, body aches, fatigue, rhinorrhea, nasal congestion, cough.  Confirmed exposures.  She is febrile at 101.5 Lungs are clear to auscultation, no tachycardia Planing of some urinary symptoms.  Had an irregular period recently. Point-of-care pregnancy test is negative. Point-of-care urinalysis evaded specific gravity at 1.030 and trace blood otherwise normal Point-of-care COVID negative point-of-care flu negative Discussed with patient viral syndrome recommend OTC meds for symptomatic relief, rest, increase fluids, no work until fever free for 24 hours, consider repeat home COVID and flu test in 24 hours Will treat for UTI based on symptoms. Final Clinical Impressions(s) / UC Diagnoses   Final diagnoses:  Acute febrile illness  Acute viral syndrome  Urinary tract infection without hematuria, site unspecified     Discharge Instructions      Over-the-counter medicines for symptom relief      ED Prescriptions     Medication Sig Dispense Auth. Provider   sulfamethoxazole-trimethoprim (BACTRIM DS) 800-160 MG tablet Take 1 tablet by mouth 2 (two) times daily for 3 days. 6 tablet Meliton Rattan, [place]      PDMP not reviewed this encounter.   Meliton Rattan, [place] 05/31/23 2006

## 2023-06-01 ENCOUNTER — Encounter (HOSPITAL_BASED_OUTPATIENT_CLINIC_OR_DEPARTMENT_OTHER): Payer: Self-pay | Admitting: Emergency Medicine

## 2023-06-01 ENCOUNTER — Emergency Department (HOSPITAL_BASED_OUTPATIENT_CLINIC_OR_DEPARTMENT_OTHER)
Admission: EM | Admit: 2023-06-01 | Discharge: 2023-06-01 | Disposition: A | Discharge status: Home / Self Care | Payer: Self-pay | Attending: Emergency Medicine | Admitting: Emergency Medicine

## 2023-06-01 DIAGNOSIS — Z20822 Contact with and (suspected) exposure to covid-19: Secondary | ICD-10-CM | POA: Insufficient documentation

## 2023-06-01 DIAGNOSIS — J101 Influenza due to other identified influenza virus with other respiratory manifestations: Secondary | ICD-10-CM | POA: Insufficient documentation

## 2023-06-01 DIAGNOSIS — J45909 Unspecified asthma, uncomplicated: Secondary | ICD-10-CM | POA: Diagnosis not present

## 2023-06-01 DIAGNOSIS — R509 Fever, unspecified: Secondary | ICD-10-CM | POA: Diagnosis present

## 2023-06-01 LAB — RESP PANEL BY RT-PCR (RSV, FLU A&B, COVID)  RVPGX2
Influenza A by PCR: POSITIVE — AB
Influenza B by PCR: NEGATIVE
Resp Syncytial Virus by PCR: NEGATIVE
SARS Coronavirus 2 by RT PCR: NEGATIVE

## 2023-06-01 MED ORDER — ALBUTEROL SULFATE HFA 108 (90 BASE) MCG/ACT IN AERS
2.0000 | INHALATION_SPRAY | Freq: Once | RESPIRATORY_TRACT | Status: AC
  Administered 2023-06-01: 2 via RESPIRATORY_TRACT
  Filled 2023-06-01: qty 6.7

## 2023-06-01 MED ORDER — XOFLUZA (80 MG DOSE) 1 X 80 MG PO TBPK: 80.0000 mg | ORAL_TABLET | Freq: Once | ORAL | 0 refills | Status: AC

## 2023-06-01 MED ORDER — ONDANSETRON 4 MG PO TBDP
4.0000 mg | ORAL_TABLET | Freq: Once | ORAL | Status: AC
  Administered 2023-06-01: 4 mg via ORAL
  Filled 2023-06-01: qty 1

## 2023-06-01 MED ORDER — ONDANSETRON 4 MG PO TBDP: 4.0000 mg | ORAL_TABLET | Freq: Three times a day (TID) | ORAL | 0 refills | Status: DC | PRN

## 2023-06-01 MED ORDER — IBUPROFEN 800 MG PO TABS
800.0000 mg | ORAL_TABLET | Freq: Once | ORAL | Status: AC
  Administered 2023-06-01: 800 mg via ORAL
  Filled 2023-06-01: qty 1

## 2023-06-01 NOTE — ED Triage Notes (Signed)
C/o cough w/ green mucus, fever, and body aches x1 week. Seen at [facility] yesterday. Taking OTC meds but no relief.

## 2023-06-01 NOTE — Discharge Instructions (Signed)
You were seen today and tested positive for the flu.  Continue Tylenol and ibuprofen at home.  Make sure that you are drinking plenty of fluids.  Use your inhaler as needed for cough.

## 2023-06-01 NOTE — ED Provider Notes (Signed)
[place] [facility] AT [facility] Provider Note   CSN: [id number] Arrival date & time: 06/01/23  1601     History  Chief Complaint  Patient presents with   Cough    Kaitlyn Luna is a 33 y.o. female.  HPI     This is a 33 year old female who presents with fever, cough, body aches.  Patient reports onset of symptoms on Sunday.  She states that the cough is keeping her up at night.  She has a history of asthma but does not have an inhaler.  She states that every time she coughs she pees on herself.  Reports nausea without vomiting.  Also has had a headache.  Was seen and evaluated urgent care yesterday and had negative COVID and influenza testing.  She states that she has used "everything over-the-counter."  Home Medications Prior to Admission medications   Medication Sig Start Date End Date Taking? Authorizing Provider  Baloxavir Marboxil,80 MG Dose, (XOFLUZA, 80 MG DOSE,) 1 x 80 MG TBPK Take 80 mg by mouth once for 1 dose. 06/01/23 06/01/23 Yes Varnell Orvis, Mayer Masker, MD  ondansetron (ZOFRAN-ODT) 4 MG disintegrating tablet Take 1 tablet (4 mg total) by mouth every 8 (eight) hours as needed. 06/01/23  Yes Aldora Perman, Mayer Masker, MD  albuterol (VENTOLIN HFA) 108 (90 Base) MCG/ACT inhaler Inhale 1-2 puffs into the lungs every 6 (six) hours as needed for wheezing or shortness of breath. 04/19/23   Radford Pax, NP  Etonogestrel [facility]) Inject into the skin.    [provider]  fluticasone (FLOVENT HFA) 110 MCG/ACT inhaler Inhale 1 puff into the lungs in the morning and at bedtime. 04/19/23   Radford Pax, NP  promethazine-dextromethorphan (PROMETHAZINE-DM) 6.25-15 MG/5ML syrup Take 5 mLs by mouth 3 (three) times daily as needed for cough. 08/21/22   Wallis Bamberg, PA-C  pseudoephedrine (SUDAFED) 60 MG tablet Take 1 tablet (60 mg total) by mouth every 8 (eight) hours as needed for congestion. 08/21/22   Wallis Bamberg, PA-C  sulfamethoxazole-trimethoprim (BACTRIM DS)  800-160 MG tablet Take 1 tablet by mouth 2 (two) times daily for 3 days. 05/31/23 06/03/23  Meliton Rattan, PA  SUMAtriptan (IMITREX) 50 MG tablet Take 1 tablet (50 mg total) by mouth as needed for headache (take as needed for your headaches, try to take near onset of a severe headache to keep it from progressing). May repeat in 2 hours if headache persists or recurs. 04/17/22   Sharon Seller, NP      Allergies    Lavender oil    Review of Systems   Review of Systems  Constitutional:  Positive for fever.  Respiratory:  Positive for cough and shortness of breath. Negative for chest tightness.   Cardiovascular:  Negative for chest pain.  Gastrointestinal:  Positive for nausea. Negative for vomiting.  Genitourinary:  Negative for dysuria.  Musculoskeletal:  Positive for myalgias.  Skin:  Negative for wound.  All other systems reviewed and are negative.   Physical Exam Updated Vital Signs BP (!) 130/90   Pulse 85   Temp 99.2 F (37.3 C) (Oral)   Resp 18   Ht 1.702 m (5\' 7" )   Wt (!) 162 kg   LMP 05/27/2023 (Exact Date)   SpO2 99%   BMI 55.94 kg/m  Physical Exam Vitals and nursing note reviewed.  Constitutional:      Appearance: She is well-developed. She is obese. She is not toxic-appearing.  HENT:     Head: Normocephalic  and atraumatic.     Nose: Congestion present.  Eyes:     Pupils: Pupils are equal, round, and reactive to light.  Cardiovascular:     Rate and Rhythm: Normal rate and regular rhythm.     Heart sounds: Normal heart sounds.  Pulmonary:     Effort: Pulmonary effort is normal. No respiratory distress.     Breath sounds: No wheezing.  Abdominal:     Palpations: Abdomen is soft.     Tenderness: There is no abdominal tenderness. There is no guarding or rebound.  Musculoskeletal:     Cervical back: Neck supple.  Skin:    General: Skin is warm and dry.  Neurological:     Mental Status: She is alert and oriented to person, place, and time.  Psychiatric:         Mood and Affect: Mood normal.     ED Results / Procedures / Treatments   Labs (all labs ordered are listed, but only abnormal results are displayed) Labs Reviewed  RESP PANEL BY RT-PCR (RSV, FLU A&B, COVID)  RVPGX2 - Abnormal; Notable for the following components:      Result Value   Influenza A by PCR POSITIVE (*)    All other components within normal limits    EKG None  Radiology No results found.  Procedures Procedures    Medications Ordered in ED Medications  ondansetron (ZOFRAN-ODT) disintegrating tablet 4 mg (4 mg Oral Given 06/01/23 1925)  ibuprofen (ADVIL) tablet 800 mg (800 mg Oral Given 06/01/23 1925)  albuterol (VENTOLIN HFA) 108 (90 Base) MCG/ACT inhaler 2 puff (2 puffs Inhalation Given 06/01/23 1827)    ED Course/ Medical Decision Making/ A&P                                 Medical Decision Making Risk Prescription drug management.   This patient presents to the ED for concern of URI symptoms, this involves an extensive number of treatment options, and is a complaint that carries with it a high risk of complications and morbidity.  I considered the following differential and admission for this acute, potentially life threatening condition.  The differential diagnosis includes viral illness such as COVID or influenza, pneumonia  MDM:    This is a 33 year old female who presents with persistent URI symptoms.  Reportedly tested negative for COVID and flu.  She is nontoxic.  She is afebrile.  She is not distressed about her symptoms.  She does have a history of asthma.  No active wheezing on exam.  However she was given an inhaler for her cough.  Influenza testing here is positive.  This is likely the culprit of her symptoms.  Given her history of asthma, she was offered [place].  Otherwise recommend ongoing supportive measures.  (Labs, imaging, consults)  Labs: I Ordered, and personally interpreted labs.  The pertinent results include: COVID,  influenza  Imaging Studies ordered: I ordered imaging studies including none I independently visualized and interpreted imaging. I agree with the radiologist interpretation  Additional history obtained from chart review.  External records from outside source obtained and reviewed including urgent care notes, prior evaluations  Cardiac Monitoring: The patient was not maintained on a cardiac monitor.  If on the cardiac monitor, I personally viewed and interpreted the cardiac monitored which showed an underlying rhythm of: N/A  Reevaluation: After the interventions noted above, I reevaluated the patient and found that they have :  stayed the same  Social Determinants of Health:  lives independently  Disposition: Discharge  Co morbidities that complicate the patient evaluation  Past Medical History:  Diagnosis Date   Anemia    Anxiety    Asthma    Bipolar depression (HCC)    Depression    Headaches, cluster 04/2022   Kidney stone    Pneumonia 2017   PTSD (post-traumatic stress disorder)    Recurrent UTI    Sleep apnea    not using c-pap     Medicines Meds ordered this encounter  Medications   ondansetron (ZOFRAN-ODT) disintegrating tablet 4 mg   ibuprofen (ADVIL) tablet 800 mg   albuterol (VENTOLIN HFA) 108 (90 Base) MCG/ACT inhaler 2 puff   ondansetron (ZOFRAN-ODT) 4 MG disintegrating tablet    Sig: Take 1 tablet (4 mg total) by mouth every 8 (eight) hours as needed.    Dispense:  20 tablet    Refill:  0   Baloxavir Marboxil,80 MG Dose, (XOFLUZA, 80 MG DOSE,) 1 x 80 MG TBPK    Sig: Take 80 mg by mouth once for 1 dose.    Dispense:  1 each    Refill:  0    I have reviewed the patients home medicines and have made adjustments as needed  Problem List / ED Course: Problem List Items Addressed This Visit   None Visit Diagnoses       Influenza A    -  Primary   Relevant Medications   Baloxavir Marboxil,80 MG Dose, (XOFLUZA, 80 MG DOSE,) 1 x 80 MG TBPK                    Final Clinical Impression(s) / ED Diagnoses Final diagnoses:  Influenza A    Rx / DC Orders ED Discharge Orders          Ordered    ondansetron (ZOFRAN-ODT) 4 MG disintegrating tablet  Every 8 hours PRN        06/01/23 1925    Baloxavir Marboxil,80 MG Dose, (XOFLUZA, 80 MG DOSE,) 1 x 80 MG TBPK   Once        06/01/23 1925              Shon Baton, MD 06/01/23 2150

## 2023-06-04 ENCOUNTER — Telehealth: Payer: Self-pay

## 2023-06-04 NOTE — Transitions of Care (Post Inpatient/ED Visit) (Signed)
   06/04/2023  Name: Kaitlyn Luna MRN: [id number] DOB: 02-May-1991  Today's TOC FU Call Status: Today's TOC FU Call Status:: Successful TOC FU Call Completed TOC FU Call Complete Date: 06/04/23 Patient's Name and Date of Birth confirmed.  Transition Care Management Follow-up Telephone Call Date of Discharge: 06/01/23 Discharge Facility: [facility] ([facility]) Type of Discharge: Emergency Department Reason for ED Visit: Respiratory Respiratory Diagnosis:  (Influenza)  Items Reviewed:    Medications Reviewed Today: Medications Reviewed Today   Medications were not reviewed in this encounter     Home Care and Equipment/Supplies:    Functional Questionnaire:    Follow up appointments reviewed:   PATIENT REFUSED APPOINTMENT    SIGNATURE: Annalisse Minkoff.D/RMA

## 2023-09-16 ENCOUNTER — Encounter: Payer: Self-pay | Admitting: Obstetrics and Gynecology

## 2023-09-16 ENCOUNTER — Telehealth: Payer: Self-pay | Admitting: Obstetrics and Gynecology

## 2023-09-16 NOTE — Telephone Encounter (Signed)
 Needed ages of the  Would need 3 releatives on the same side or under the age of 74 to meet the criteria   Spoke with Dr Redgie Cancer (sp?): states that based on the information regarding which family members and ages provided, does not meet criteria for coverage  Would need to document the ages of diagnosis for the family members with cancer  30 days from May 2 to submit information for appeal

## 2023-12-17 ENCOUNTER — Ambulatory Visit: Admitting: Adult Health

## 2023-12-24 ENCOUNTER — Encounter: Payer: Self-pay | Admitting: Orthopedic Surgery

## 2023-12-24 ENCOUNTER — Other Ambulatory Visit: Payer: Self-pay

## 2023-12-24 ENCOUNTER — Ambulatory Visit (INDEPENDENT_AMBULATORY_CARE_PROVIDER_SITE_OTHER): Admitting: Orthopedic Surgery

## 2023-12-24 DIAGNOSIS — G5601 Carpal tunnel syndrome, right upper limb: Secondary | ICD-10-CM

## 2023-12-24 DIAGNOSIS — M79602 Pain in left arm: Secondary | ICD-10-CM | POA: Diagnosis not present

## 2023-12-24 NOTE — Progress Notes (Signed)
 Office Visit Note   Patient: Kaitlyn Luna           Date of Birth: 04-Sep-1990           MRN: [id number] Visit Date: 12/24/2023 Requested by: [name], NP [address] [place],  [place] [address] PCP: [name], NP  Subjective: Chief Complaint  Patient presents with   Left Shoulder - Pain    HPI: Jagger Beahm is a 33 y.o. female who presents to the office reporting left shoulder and scapular pain.  She did have ultrasound-guided trigger point injection about 6 months ago which helped her for 3 to 4 months.  Describes some radicular arm pain.  She has tried physical therapy without relief.  Describes neck pain scapular pain as well as some occasional left-sided face numbness.  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  She describes carpal tunnel syndrome on the right-hand side.  She states her right arm is no good because of prior fracture.  She has had left carpal tunnel release but not right carpal tunnel release.  Having some residual symptoms on that left-hand side as well..                ROS: All systems reviewed are negative as they relate to the chief complaint within the history of present illness.  Patient denies fevers or chills.  Assessment & Plan: Visit Diagnoses:  1. Left arm pain   2. Carpal tunnel syndrome, right upper limb     Plan: Impression is continued left scapular pain.  Pain is very focal.  Injection helped last clinic visit.  Repeat injection performed today under ultrasound guidance.  She is also having some carpal tunnel syndrome symptoms in both hands.  Has had carpal tunnel release on the left but not the right.  Symptoms are rather severe.  EMG nerve study indicated to evaluate the severity of the carpal tunnel follow-up after that study.  Follow-Up Instructions: No follow-ups on file.   Orders:  Orders Placed This Encounter  Procedures   US  Guided Needle Placement - No Linked Charges   Ambulatory referral to Physical  Medicine Rehab   No orders of the defined types were placed in this encounter.     Procedures: No procedures performed   Clinical Data: No additional findings.  Objective: Vital Signs: There were no vitals taken for this visit.  Physical Exam:  Constitutional: Patient appears well-developed HEENT:  Head: Normocephalic Eyes:EOM are normal Neck: Normal range of motion Cardiovascular: Normal rate Pulmonary/chest: Effort normal Neurologic: Patient is alert Skin: Skin is warm Psychiatric: Patient has normal mood and affect  Ortho Exam: Ortho exam demonstrates good cervical spine range of motion.  Patient has 5 out of 5 grip EPL FPL interosseous resection extension bicep tricep deltoid strength.  A lot of tenderness but no crepitus around that left scapular region.  Has good abductor pollicis brevis strength bilaterally.  Positive carpal tunnel compression testing on the right negative on the left.  Negative Tinel's cubital tunnel bilaterally with no subluxation of the ulnar nerve.  Specialty Comments:  No specialty comments available.  Imaging: No results found.   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   Past Medical History:  Diagnosis Date   Anemia    Anxiety    Asthma    Bipolar depression (HCC)    Depression    Headaches, cluster 04/2022   Kidney stone    Pneumonia 2017   PTSD (post-traumatic stress disorder)    Recurrent UTI    Sleep apnea    not using c-pap    Family History  Problem  Relation Age of Onset   Arthritis Mother    Diabetes Father    Alzheimer's disease Father    Alzheimer's disease Maternal Grandfather    Stroke Paternal Grandmother    Dementia Paternal Grandfather     Past Surgical History:  Procedure Laterality Date   BREAST SURGERY  2013   [id number]   CARPAL TUNNEL RELEASE Left    CERVICAL CONIZATION W/BX N/A 05/27/2017   Procedure: CONIZATION CERVIX WITH BIOPSY - COLD KNIFE;  Surgeon: [name], MD;  Location: [facility];  Service: Gynecology;  Laterality: N/A;   KNEE ARTHROSCOPY Left    TONSILLECTOMY     Social History   Occupational History   Not on file  Tobacco Use   Smoking status: Former    Current packs/day: 0.00    Types: Cigarettes    Quit date: 12/07/2015    Years since quitting: 8.0   Smokeless tobacco: Never  Vaping Use   Vaping status: Never Used  Substance and Sexual Activity   Alcohol use: Not Currently    Comment: occ   Drug use: No   Sexual activity: Not Currently    Birth control/protection: Implant, Condom    Comment: last nexplanon placed 07/28/22

## 2024-01-14 ENCOUNTER — Ambulatory Visit: Admitting: Physical Medicine and Rehabilitation

## 2024-01-14 DIAGNOSIS — R202 Paresthesia of skin: Secondary | ICD-10-CM

## 2024-01-14 NOTE — Progress Notes (Signed)
 Pain Scale   Average Pain 5 Patient advising she has pain in her Right hand with numbness tingling and weakness. Patient advising she is Left hand dominate. Patient also advising she has had surgery on her left hand ans she has no issues with that hand.        +Driver, -BT, -Dye Allergies.

## 2024-01-14 NOTE — Progress Notes (Unsigned)
 Kaitlyn Luna - 33 y.o. female MRN [id number]  Date of birth: 1990-10-29  Office Visit Note: Visit Date: 01/14/2024 PCP: [name], NP Referred by: Addie Cordella Hamilton, MD  Subjective: Chief Complaint  Patient presents with   Right Hand - Pain, Numbness, Weakness   HPI: Kaitlyn Luna is a 33 y.o. female who comes in todayHPI   ROS Otherwise per HPI.  Assessment & Plan: Visit Diagnoses:    ICD-10-CM   1. Paresthesia of skin  R20.2 NCV with EMG (electromyography)       Plan: Impression: The above electrodiagnostic study is ABNORMAL and reveals evidence of a severe right median nerve entrapment at the wrist (carpal tunnel syndrome) affecting sensory and motor components.   There is no significant electrodiagnostic evidence of any other focal nerve entrapment, brachial plexopathy or cervical radiculopathy.   Recommendations: 1.  Follow-up with referring physician. 2.  Continue current management of symptoms. 3.  Suggest surgical evaluation.  Meds & Orders: No orders of the defined types were placed in this encounter.   Orders Placed This Encounter  Procedures   NCV with EMG (electromyography)    Follow-up: No follow-ups on file.   Procedures: No procedures performed  EMG & NCV Findings: Evaluation of the right median motor nerve showed prolonged distal onset latency (5.4 ms) and decreased conduction velocity (Elbow-Wrist, 43 m/s).  The right median (across palm) sensory nerve showed prolonged distal peak latency (Wrist, 5.0 ms), reduced amplitude (9.3 V), and prolonged distal peak latency (Palm, 2.2 ms).  All remaining nerves (as indicated in the following tables) were within normal limits.    Needle evaluation of the right abductor pollicis brevis muscle showed slightly increased spontaneous activity.  All remaining muscles (as indicated in the following table) showed no evidence of electrical instability.    Impression: The above electrodiagnostic study  is ABNORMAL and reveals evidence of a severe right median nerve entrapment at the wrist (carpal tunnel syndrome) affecting sensory and motor components.   There is no significant electrodiagnostic evidence of any other focal nerve entrapment, brachial plexopathy or cervical radiculopathy.   Recommendations: 1.  Follow-up with referring physician. 2.  Continue current management of symptoms. 3.  Suggest surgical evaluation.  ___________________________ Prentice Masters FAAPMR Board Certified, American Board of Physical Medicine and Rehabilitation    Nerve Conduction Studies Anti Sensory Summary Table   Stim Site NR Peak (ms) Norm Peak (ms) P-T Amp (V) Norm P-T Amp Site1 Site2 Delta-P (ms) Dist (cm) Vel (m/s) Norm Vel (m/s)  Right Median Acr Palm Anti Sensory (2nd Digit)  30.6C  Wrist    *5.0 <3.6 *9.3 >10 Wrist Palm 2.8 0.0    Palm    *2.2 <2.0 1.3         Right Radial Anti Sensory (Base 1st Digit)  31C  Wrist    2.1 <3.1 12.3  Wrist Base 1st Digit 2.1 0.0    Right Ulnar Anti Sensory (5th Digit)  31.2C  Wrist    3.0 <3.7 22.5 >15.0 Wrist 5th Digit 3.0 14.0 47 >38   Motor Summary Table   Stim Site NR Onset (ms) Norm Onset (ms) O-P Amp (mV) Norm O-P Amp Site1 Site2 Delta-0 (ms) Dist (cm) Vel (m/s) Norm Vel (m/s)  Right Median Motor (Abd Poll Brev)  31.4C    Martin-Gruber  Wrist    *5.4 <4.2 7.7 >5 Elbow Wrist 5.2 22.5 *43 >50  Elbow    10.6  8.3  Right Ulnar Motor (Abd Dig Min)  31.5C  Wrist    2.5 <4.2 6.9 >3 B Elbow Wrist 3.6 22.0 61 >53  B Elbow    6.1  6.5  A Elbow B Elbow 1.2 12.0 100 >53  A Elbow    7.3  6.7          EMG   Side Muscle Nerve Root Ins Act Fibs Psw Amp Dur Poly Recrt Int Bruna Comment  Right Abd Poll Brev Median C8-T1 Nml *1+ *1+ Nml Nml 0 Nml Nml   Right 1stDorInt Ulnar C8-T1 Nml Nml Nml Nml Nml 0 Nml Nml   Right PronatorTeres Median C6-7 Nml Nml Nml Nml Nml 0 Nml Nml   Right Biceps Musculocut C5-6 Nml Nml Nml Nml Nml 0 Nml Nml     Nerve  Conduction Studies Anti Sensory Left/Right Comparison   Stim Site L Lat (ms) R Lat (ms) L-R Lat (ms) L Amp (V) R Amp (V) L-R Amp (%) Site1 Site2 L Vel (m/s) R Vel (m/s) L-R Vel (m/s)  Median Acr Palm Anti Sensory (2nd Digit)  30.6C  Wrist  *5.0   *9.3  Wrist Palm     Palm  *2.2   1.3        Radial Anti Sensory (Base 1st Digit)  31C  Wrist  2.1   12.3  Wrist Base 1st Digit     Ulnar Anti Sensory (5th Digit)  31.2C  Wrist  3.0   22.5  Wrist 5th Digit  47    Motor Left/Right Comparison   Stim Site L Lat (ms) R Lat (ms) L-R Lat (ms) L Amp (mV) R Amp (mV) L-R Amp (%) Site1 Site2 L Vel (m/s) R Vel (m/s) L-R Vel (m/s)  Median Motor (Abd Poll Brev)  31.4C    Martin-Gruber  Wrist  *5.4   7.7  Elbow Wrist  *43   Elbow  10.6   8.3        Ulnar Motor (Abd Dig Min)  31.5C  Wrist  2.5   6.9  B Elbow Wrist  61   B Elbow  6.1   6.5  A Elbow B Elbow  100   A Elbow  7.3   6.7           Waveforms:            Clinical History: MRI cervical spine without contrast:  INDICATION:  Neck pain for 3 months  TECHNIQUE: Sagittal T1, T2, and STIR images were performed. Axial T2-weighted images.  COMPARISON:  Cervical spine x-rays 09/04/2019  FINDINGS: #  Vertebral bodies: Normal height and alignment. #  Marrow signal: No significant abnormality. #  Cervical spinal cord: No mass or abnormal signal intensity.  No spinal cord compression. #  Craniovertebral junction: Normal. #    #  C2-3: Normal. #  C3-4: Normal. #  C4-5: Normal. #  C5-6: Normal. #  C6-7: Normal. #  C7-T1: Normal.   IMPRESSION: Normal MRI of the cervical spine.  Electronically Signed by: Debby Chess Exam End: 10/19/19 17:27   She reports that she quit smoking about 8 years ago. Her smoking use included cigarettes. She has never used smokeless tobacco. No results for input(s): HGBA1C, LABURIC in the last 8760 hours.  Objective:  VS:  HT:    WT:   BMI:     BP:   HR: bpm  TEMP: ( )  RESP:  Physical Exam   Ortho Exam  Imaging: No results found.  Past Medical/Family/Surgical/Social History: Medications & Allergies reviewed per EMR, new medications updated. 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   Past Medical History:  Diagnosis Date   Anemia    Anxiety    Asthma    Bipolar depression (HCC)    Depression    Headaches, cluster 04/2022   Kidney stone    Pneumonia 2017   PTSD (post-traumatic stress disorder)    Recurrent UTI    Sleep apnea    not using c-pap   Family History  Problem Relation Age of Onset   Arthritis Mother    Diabetes Father    Alzheimer's disease Father    Alzheimer's disease Maternal Grandfather    Stroke Paternal Grandmother    Dementia Paternal Grandfather    Past Surgical History:  Procedure Laterality Date   BREAST SURGERY  2013   [id number]   CARPAL TUNNEL RELEASE Left    CERVICAL CONIZATION W/BX N/A 05/27/2017   Procedure: CONIZATION CERVIX WITH BIOPSY - COLD KNIFE;  Surgeon: [name], MD;  Location: [facility];  Service: Gynecology;  Laterality: N/A;   KNEE ARTHROSCOPY Left    TONSILLECTOMY     Social History   Occupational History   Not on file  Tobacco Use   Smoking status: Former    Current packs/day: 0.00    Types: Cigarettes    Quit date: 12/07/2015    Years since quitting: 8.1   Smokeless tobacco: Never  Vaping Use   Vaping status: Never Used  Substance and Sexual Activity   Alcohol use: Not Currently    Comment: occ   Drug use: No   Sexual activity: Not Currently     Birth control/protection: Implant, Condom    Comment: last nexplanon  placed 07/28/22

## 2024-01-17 ENCOUNTER — Ambulatory Visit (INDEPENDENT_AMBULATORY_CARE_PROVIDER_SITE_OTHER): Admitting: Orthopedic Surgery

## 2024-01-17 DIAGNOSIS — G5601 Carpal tunnel syndrome, right upper limb: Secondary | ICD-10-CM | POA: Diagnosis not present

## 2024-01-17 NOTE — Procedures (Unsigned)
 EMG & NCV Findings: Evaluation of the right median motor nerve showed prolonged distal onset latency (5.4 ms) and decreased conduction velocity (Elbow-Wrist, 43 m/s).  The right median (across palm) sensory nerve showed prolonged distal peak latency (Wrist, 5.0 ms), reduced amplitude (9.3 V), and prolonged distal peak latency (Palm, 2.2 ms).  All remaining nerves (as indicated in the following tables) were within normal limits.    Needle evaluation of the right abductor pollicis brevis muscle showed slightly increased spontaneous activity.  All remaining muscles (as indicated in the following table) showed no evidence of electrical instability.    Impression: The above electrodiagnostic study is ABNORMAL and reveals evidence of a severe right median nerve entrapment at the wrist (carpal tunnel syndrome) affecting sensory and motor components.   There is no significant electrodiagnostic evidence of any other focal nerve entrapment, brachial plexopathy or cervical radiculopathy.   Recommendations: 1.  Follow-up with referring physician. 2.  Continue current management of symptoms. 3.  Suggest surgical evaluation.  ___________________________ Prentice Masters FAAPMR Board Certified, American Board of Physical Medicine and Rehabilitation    Nerve Conduction Studies Anti Sensory Summary Table   Stim Site NR Peak (ms) Norm Peak (ms) P-T Amp (V) Norm P-T Amp Site1 Site2 Delta-P (ms) Dist (cm) Vel (m/s) Norm Vel (m/s)  Right Median Acr Palm Anti Sensory (2nd Digit)  30.6C  Wrist    *5.0 <3.6 *9.3 >10 Wrist Palm 2.8 0.0    Palm    *2.2 <2.0 1.3         Right Radial Anti Sensory (Base 1st Digit)  31C  Wrist    2.1 <3.1 12.3  Wrist Base 1st Digit 2.1 0.0    Right Ulnar Anti Sensory (5th Digit)  31.2C  Wrist    3.0 <3.7 22.5 >15.0 Wrist 5th Digit 3.0 14.0 47 >38   Motor Summary Table   Stim Site NR Onset (ms) Norm Onset (ms) O-P Amp (mV) Norm O-P Amp Site1 Site2 Delta-0 (ms) Dist (cm) Vel  (m/s) Norm Vel (m/s)  Right Median Motor (Abd Poll Brev)  31.4C    Martin-Gruber  Wrist    *5.4 <4.2 7.7 >5 Elbow Wrist 5.2 22.5 *43 >50  Elbow    10.6  8.3         Right Ulnar Motor (Abd Dig Min)  31.5C  Wrist    2.5 <4.2 6.9 >3 B Elbow Wrist 3.6 22.0 61 >53  B Elbow    6.1  6.5  A Elbow B Elbow 1.2 12.0 100 >53  A Elbow    7.3  6.7          EMG   Side Muscle Nerve Root Ins Act Fibs Psw Amp Dur Poly Recrt Int Bruna Comment  Right Abd Poll Brev Median C8-T1 Nml *1+ *1+ Nml Nml 0 Nml Nml   Right 1stDorInt Ulnar C8-T1 Nml Nml Nml Nml Nml 0 Nml Nml   Right PronatorTeres Median C6-7 Nml Nml Nml Nml Nml 0 Nml Nml   Right Biceps Musculocut C5-6 Nml Nml Nml Nml Nml 0 Nml Nml     Nerve Conduction Studies Anti Sensory Left/Right Comparison   Stim Site L Lat (ms) R Lat (ms) L-R Lat (ms) L Amp (V) R Amp (V) L-R Amp (%) Site1 Site2 L Vel (m/s) R Vel (m/s) L-R Vel (m/s)  Median Acr Palm Anti Sensory (2nd Digit)  30.6C  Wrist  *5.0   *9.3  Wrist Palm     Palm  *2.2  1.3        Radial Anti Sensory (Base 1st Digit)  31C  Wrist  2.1   12.3  Wrist Base 1st Digit     Ulnar Anti Sensory (5th Digit)  31.2C  Wrist  3.0   22.5  Wrist 5th Digit  47    Motor Left/Right Comparison   Stim Site L Lat (ms) R Lat (ms) L-R Lat (ms) L Amp (mV) R Amp (mV) L-R Amp (%) Site1 Site2 L Vel (m/s) R Vel (m/s) L-R Vel (m/s)  Median Motor (Abd Poll Brev)  31.4C    Martin-Gruber  Wrist  *5.4   7.7  Elbow Wrist  *43   Elbow  10.6   8.3        Ulnar Motor (Abd Dig Min)  31.5C  Wrist  2.5   6.9  B Elbow Wrist  61   B Elbow  6.1   6.5  A Elbow B Elbow  100   A Elbow  7.3   6.7           Waveforms:

## 2024-01-17 NOTE — Progress Notes (Signed)
 Kaitlyn Luna - 33 y.o. female MRN [id number]  Date of birth: 1990/07/23  Office Visit Note: Visit Date: 01/17/2024 PCP: [name], NP Referred by: [name], NP  Subjective: No chief complaint on file.  HPI: Kaitlyn Luna is a pleasant 33 y.o. female who presents today for evaluation of right hand carpal tunnel syndrome.  Patient had both clinical and electrodiagnostic evidence of right sided carpal tunnel syndrome refractory conservative treatment in the form of activity modification, bracing and nonsteroidal anti-inflammatory medication.  She has a history notable [place]sided [place]  At this juncture, she is interested in moving forward with right sided carpal tunnel release given her significant symptoms.  She is describing ongoing nocturnal symptoms as well.  She is here today for specific hand surgical evaluation  Pertinent ROS were reviewed with the patient and found to be negative unless otherwise specified above in HPI.   Visit Reason: Right hand carpal tunnel syndrome Duration of symptoms: Multiple years, estimates 10 years Hand dominance: right Diabetic: No Smoking: No Heart/Lung History: None Blood Thinners: None  Prior Testing/EMG: Right sided EMG with severe carpal tunnel syndrome Injections (Date): None Treatments: Bracing, activity modification, NSAIDs Prior Surgery: None, history of left-sided carpal tunnel syndrome status post release at outside facility   Assessment & Plan: Visit Diagnoses:  1. Carpal tunnel syndrome, right upper limb     Plan: Extensive discussion was had with the patient today about her ongoing right sided carpal tunnel syndrome that is refractory to conservative care.  Patient has both clinical and electrodiagnostic evidence to confirm severe carpal tunnel diagnosis.  At this juncture, she is indicated for right open versus endoscopic carpal tunnel  release.  Risks and benefits of both operations were discussed in detail today.  Understanding all risks and benefits, patient would like to have surgery done in the form of right open carpal tunnel release under IV sedation.  Risks include but not limited to infection, bleeding, scarring, stiffness, nerve injury or vascular, tendon injury, risk of recurrence and need for subsequent operation were all discussed in detail.  Patient consented understanding the above.  Will move forward surgical scheduling.   Follow-up: No follow-ups on file.   Meds & Orders: No orders of the defined types were placed in this encounter.  No orders of the defined types were placed in this encounter.    Procedures: No procedures performed      Clinical History: MRI cervical spine without contrast:  INDICATION:  Neck pain for 3 months  TECHNIQUE: Sagittal T1, T2, and STIR images were performed. Axial T2-weighted images.  COMPARISON:  Cervical spine x-rays 09/04/2019  FINDINGS: #  Vertebral bodies: Normal height and alignment. #  Marrow signal: No significant abnormality. #  Cervical spinal cord: No mass or abnormal signal intensity.  No spinal cord compression. #  Craniovertebral junction: Normal. #    #  C2-3: Normal. #  C3-4: Normal. #  C4-5: Normal. #  C5-6: Normal. #  C6-7: Normal. #  C7-T1: Normal.   IMPRESSION: Normal MRI of the cervical spine.  Electronically Signed by: Debby Chess Exam End: 10/19/19 17:27  She reports that she quit smoking about 8 years ago. Her smoking use included cigarettes. She has never used smokeless tobacco. No results for input(s): HGBA1C, LABURIC in the last 8760 hours.  Objective:   Vital Signs: There were no vitals taken for this visit.  Physical Exam  Gen: Well-appearing, in no  acute distress; non-toxic CV: Regular Rate. Well-perfused. Warm.  Resp: Breathing unlabored on room air; no wheezing. Psych: Fluid speech in conversation; appropriate  affect; normal thought process  Ortho Exam PHYSICAL EXAM:  General: Patient is well appearing and in no distress.  Skin and Muscle: Prior well-healed left palmar incision from previous carpal tunnel release.  No other skin changes are apparent to upper extremities.   Range of Motion and Palpation Tests: Mobility is full about the elbows with flexion and extension.  Forearm supination and pronation are 85/85 bilaterally.  Wrist flexion/extension is 75/65 bilaterally.  Digital flexion and extension are full.  Thumb opposition is full to the base of the small fingers bilaterally.    No cords or nodules are palpated.  No triggering is observed.     Neurologic, Vascular, Motor: Sensation is diminished to light touch in the right median nerve distribution.    Thenar atrophy: Negative right Tinel sign: Positive right carpal tunnel Carpal tunnel compression: Positive right Phalen test: Positive right  Motor right hand FPL: 5/5 Index FDP: 5/5 APB: 4/5  Fingers pink and well perfused.  Capillary refill is brisk.     Lab Results  Component Value Date   HGBA1C 5.5 05/28/2021     Imaging: No results found.  Past Medical/Family/Surgical/Social History: Medications & Allergies reviewed per EMR, new medications updated. 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   Past Medical History:  Diagnosis Date   Anemia    Anxiety    Asthma    Bipolar depression (HCC)    Depression    Headaches, cluster 04/2022   Kidney stone     Pneumonia 2017   PTSD (post-traumatic stress disorder)    Recurrent UTI    Sleep apnea    not using c-pap   Family History  Problem Relation Age of Onset   Arthritis Mother    Diabetes Father    Alzheimer's disease Father    Alzheimer's disease Maternal Grandfather    Stroke Paternal Grandmother    Dementia Paternal Grandfather    Past Surgical History:  Procedure Laterality Date   BREAST SURGERY  2013   [id number]   CARPAL TUNNEL RELEASE Left    CERVICAL CONIZATION W/BX N/A 05/27/2017   Procedure: CONIZATION CERVIX WITH BIOPSY - COLD KNIFE;  Surgeon: [name], MD;  Location: [facility];  Service: Gynecology;  Laterality: N/A;   KNEE ARTHROSCOPY Left    TONSILLECTOMY     Social History   Occupational History   Not on file  Tobacco Use   Smoking status: Former    Current packs/day: 0.00    Types: Cigarettes    Quit date: 12/07/2015    Years since quitting: 8.1   Smokeless tobacco: Never  Vaping Use   Vaping status: Never Used  Substance and Sexual Activity   Alcohol use: Not Currently    Comment: occ   Drug use: No   Sexual activity: Not Currently    Birth control/protection: Implant, Condom    Comment: last nexplanon  placed 07/28/22    Journei Thomassen Estela) Arlinda, M.D.  [facility], Hand Surgery

## 2024-01-18 ENCOUNTER — Telehealth: Payer: Self-pay | Admitting: Orthopedic Surgery

## 2024-01-18 NOTE — Telephone Encounter (Signed)
 Ok for this?

## 2024-01-18 NOTE — Telephone Encounter (Signed)
 Note sent to mychart.

## 2024-01-18 NOTE — Telephone Encounter (Signed)
 Called and advised pt.

## 2024-01-18 NOTE — Telephone Encounter (Signed)
 Pt had an appt with Ash yesterday and need a out of work note for yesterday visit. Please call pt when ready for pick up. Pt phone number is [phone].

## 2024-01-19 ENCOUNTER — Telehealth: Payer: Self-pay | Admitting: Orthopedic Surgery

## 2024-01-19 NOTE — Telephone Encounter (Signed)
 I spoke with the patient and scheduled her surgery for 9/18. Patient is requesting a work note to excuse her from work 9/20 thru 9/23. (Days she is scheduled to work post surgery.) She requests for you to put note in My Chart once completed and she can get it from there. Please advise patient.

## 2024-01-20 ENCOUNTER — Other Ambulatory Visit: Payer: Self-pay

## 2024-01-20 DIAGNOSIS — G5601 Carpal tunnel syndrome, right upper limb: Secondary | ICD-10-CM

## 2024-01-20 NOTE — Telephone Encounter (Signed)
 Sent!

## 2024-01-26 ENCOUNTER — Ambulatory Visit: Admitting: Orthopedic Surgery

## 2024-01-26 ENCOUNTER — Other Ambulatory Visit: Payer: Self-pay | Admitting: Orthopedic Surgery

## 2024-01-26 MED ORDER — ACETAMINOPHEN-CODEINE 300-30 MG PO TABS: 1.0000 | ORAL_TABLET | Freq: Four times a day (QID) | ORAL | 0 refills | Status: DC | PRN

## 2024-01-27 DIAGNOSIS — G5601 Carpal tunnel syndrome, right upper limb: Secondary | ICD-10-CM | POA: Diagnosis not present

## 2024-01-28 ENCOUNTER — Telehealth: Payer: Self-pay

## 2024-01-28 ENCOUNTER — Other Ambulatory Visit: Payer: Self-pay | Admitting: Orthopedic Surgery

## 2024-01-28 ENCOUNTER — Telehealth: Payer: Self-pay | Admitting: Radiology

## 2024-01-28 MED ORDER — OXYCODONE HCL 5 MG PO CAPS: 5.0000 mg | ORAL_CAPSULE | Freq: Four times a day (QID) | ORAL | 0 refills | Status: DC | PRN

## 2024-01-28 NOTE — Telephone Encounter (Signed)
 Patient called triage line,states that her pharmacy cannot get the Capsules for pain meds, they need the script changed to tablets.  Please call her to advise.

## 2024-01-28 NOTE — Telephone Encounter (Signed)
 Called patient

## 2024-01-28 NOTE — Telephone Encounter (Signed)
 Patient called stating her han is in severe pain. States pain medication and antiinflammatory is not working. I told her she could loosen the wrap a bit but she said it just hurts too bad and is burning

## 2024-02-01 ENCOUNTER — Telehealth: Payer: Self-pay

## 2024-02-01 NOTE — Telephone Encounter (Signed)
 Called and spoke with patient. Informed her to keep the incision covered and dry

## 2024-02-01 NOTE — Telephone Encounter (Signed)
 Patient would like to know if she is able to get the stitches wet.  Patient stated she had surgery on Thursday, 01/27/2024.  CB# [phone].  Please advise.  Thank you.

## 2024-02-10 ENCOUNTER — Encounter: Payer: Self-pay | Admitting: Occupational Therapy

## 2024-02-10 ENCOUNTER — Ambulatory Visit: Attending: Orthopedic Surgery | Admitting: Occupational Therapy

## 2024-02-10 ENCOUNTER — Ambulatory Visit: Admitting: Orthopedic Surgery

## 2024-02-10 DIAGNOSIS — M6281 Muscle weakness (generalized): Secondary | ICD-10-CM | POA: Diagnosis present

## 2024-02-10 DIAGNOSIS — R29898 Other symptoms and signs involving the musculoskeletal system: Secondary | ICD-10-CM | POA: Diagnosis present

## 2024-02-10 DIAGNOSIS — R29818 Other symptoms and signs involving the nervous system: Secondary | ICD-10-CM | POA: Insufficient documentation

## 2024-02-10 DIAGNOSIS — R278 Other lack of coordination: Secondary | ICD-10-CM | POA: Insufficient documentation

## 2024-02-10 DIAGNOSIS — M79641 Pain in right hand: Secondary | ICD-10-CM | POA: Diagnosis present

## 2024-02-10 DIAGNOSIS — G5601 Carpal tunnel syndrome, right upper limb: Secondary | ICD-10-CM | POA: Diagnosis not present

## 2024-02-10 DIAGNOSIS — R208 Other disturbances of skin sensation: Secondary | ICD-10-CM | POA: Insufficient documentation

## 2024-02-10 DIAGNOSIS — Z9889 Other specified postprocedural states: Secondary | ICD-10-CM

## 2024-02-10 NOTE — Progress Notes (Signed)
   Neyah Ellerman - 33 y.o. female MRN [id number]  Date of birth: 03/04/1991  Office Visit Note: Visit Date: 02/10/2024 PCP: [name], NP Referred by: [name], NP  Subjective:  HPI: Shanzay Hepworth is a 33 y.o. female who presents today for follow up 2 weeks status post right wrist open carpal tunnel release.  Doing well overall, does have some ongoing hypersensitivity at the incisional site.  Pertinent ROS were reviewed with the patient and found to be negative unless otherwise specified above in HPI.   Assessment & Plan: Visit Diagnoses:  1. S/P carpal tunnel release     Plan: Sutures removed today.  She will be seen by occupational therapy to begin range of motion exercises and transition home exercise program when appropriate.  Follow-up myself in approximate 4 weeks.  Follow-up: No follow-ups on file.   Meds & Orders: No orders of the defined types were placed in this encounter.  No orders of the defined types were placed in this encounter.    Procedures: No procedures performed       Objective:   Vital Signs: There were no vitals taken for this visit.  Ortho Exam Right hand: - Well-healing palmar incision, sutures removed, skin edges well-approximated without erythema or drainage - Composite fist without restriction - Sensation remains slightly diminished in the median nerve distribution - 4+/5 APB no thenar atrophy   Imaging: No results found.   Timberly Yott Afton Alderton, M.D. [facility], Hand Surgery

## 2024-02-10 NOTE — Therapy (Unsigned)
 OUTPATIENT OCCUPATIONAL THERAPY ORTHO EVALUATION  Patient Name: Kaitlyn Luna MRN: [id number] DOB:1990/10/06, 33 y.o., female Today's Date: 02/10/2024  PCP: [name], NP  REFERRING PROVIDER: [name], MD  END OF SESSION:  OT End of Session - 02/10/24 1655     Visit Number 1    Number of Visits 11    Date for Recertification  04/21/24    Authorization Type [employer] - auth required    OT Start Time 1019    OT Stop Time 1101    OT Time Calculation (min) 42 min    Activity Tolerance Patient tolerated treatment well    Behavior During Therapy [facility] for tasks assessed/performed         Past Medical History:  Diagnosis Date   Anemia    Anxiety    Asthma    Bipolar depression (HCC)    Depression    Headaches, cluster 04/2022   Kidney stone    Pneumonia 2017   PTSD (post-traumatic stress disorder)    Recurrent UTI    Sleep apnea    not using c-pap   Past Surgical History:  Procedure Laterality Date   BREAST SURGERY  2013   REDUCTI0N   CARPAL TUNNEL RELEASE Left    CERVICAL CONIZATION W/BX N/A 05/27/2017   Procedure: CONIZATION CERVIX WITH BIOPSY - COLD KNIFE;  Surgeon: [name], MD;  Location: [facility];  Service: Gynecology;  Laterality: N/A;   KNEE ARTHROSCOPY Left    TONSILLECTOMY     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   ONSET DATE: 01/20/2024 (Date of referral)  DOS - 01/27/2024   REFERRING DIAG: G56.01 (ICD-10-CM) - Carpal tunnel syndrome, right upper limb   THERAPY  DIAG:  Other lack of coordination  Muscle weakness (generalized)  Other disturbances of skin sensation  Other symptoms and signs involving the musculoskeletal system  Other symptoms and signs involving the nervous system  Pain in right hand  Rationale for Evaluation and Treatment: Rehabilitation  SUBJECTIVE:   SUBJECTIVE STATEMENT: She had her L CTR a few years ago and did not have therapy. She is concerned about the hole in my hand.  Pt accompanied by: self  PERTINENT HISTORY: PMH: PTSD, major depressive disorder, morbid obesity, L CTR, L knee arthroplasty, hx of smoking, sleep apnea, and migraines.   Per referring provider:  Ayan Yankey is a 33 y.o. female who presents today for follow up 2 weeks status post right wrist open carpal tunnel release.  Doing well overall, does have some ongoing hypersensitivity at the incisional site.   Pertinent ROS were reviewed with the patient and found to be negative unless otherwise specified above in HPI.   Sutures removed today. She will be seen by occupational therapy to begin range of motion exercises and transition home exercise program when appropriate. Follow-up myself in approximate 4 weeks.   PRECAUTIONS: Follow IHP page 382  WEIGHT BEARING RESTRICTIONS: No  PAIN:  Are you having pain? Yes: NPRS scale: 5/10 Pain location: R wrist Pain description: burning, zap Aggravating factors: night and with weather  changes Relieving factors: medication and ice  FALLS: Has patient fallen in last 6 months? 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 03/06/2024 at 8:45  OBJECTIVE:  Note: Objective measures were completed at Evaluation unless otherwise noted.  HAND DOMINANCE: Left  ADLs: Mod I  FUNCTIONAL OUTCOME  MEASURES: Quick Dash: 59.1 % disability with use of RUE   UPPER EXTREMITY ROM:    RUE: WFL  HAND FUNCTION: Grip strength: Right: TBD lbs; Left: 54.2 lbs  COORDINATION: 9 Hole Peg test: Right: 25 sec; Left: 21 sec  SENSATION: Paresthesias in R hand reported  EDEMA: mild reported and observed  COGNITION: Overall cognitive status: Within functional limits for tasks assessed  OBSERVATIONS: Pt ambulates without use of AD. No loss of balance. The pt appears well kept. R palmar incision not fully approximated with trace clear drainage. No erythema or signs of infection.   TREATMENT:   OT educated pt on rehabilitation process and results of objective measures in relation to pt specific goals.    Pt issued tendon gliding exercises/handout with review of motions to isolate DIP, PIP and MCP joints for straight finger position, hook (DIP/PIP flexion), fist (DIP/PIP/MCP flexion), taco/duck (MCP flexion only) and flat fist (MCP and PIP flexion). Education provided on purpose of tendon glide exercises ie) to increase the circulation to the hand and wrist, reduce swelling and promote healthier soft tissue for increased AROM and not to build hand or wrist strength at this time.                                                                                                                            PATIENT EDUCATION: Education details: OT Role and POC; tendon glides Person educated: Patient Education method: Programmer, multimedia, Demonstration, Verbal cues, and Handouts Education comprehension: verbalized understanding, returned demonstration, verbal cues required, and needs further education  HOME EXERCISE PROGRAM: 02/10/2024: tendon glides  GOALS:  SHORT TERM GOALS: Target date: 03/09/2024    Patient will demonstrate initial R UE HEP with 25% verbal cues or less for proper execution. Baseline: initiated tendon glides Goal status: INITIAL  2.  Pt will independently recall the 5 main sensory  precautions (cold, heat, sharp, chemical, and heavy) as needed to prevent injury/harm secondary to impairments.   Baseline:  Goal status: INITIAL  3.  Pt will independently recall at least 3 joint protection, ergonomics, and body mechanic principles as noted in pt instructions.   Baseline:  Goal status: INITIAL   LONG TERM GOALS: Target date: 04/21/2024  Patient will demonstrate updated R UE HEP with visual handouts only for proper execution. Baseline:  Goal status: INITIAL  2.  Patient will demonstrate at least 16% improvement with quick Dash score (reporting 43.1% disability or less) indicating improved functional use  of affected extremity. Baseline: 59.1 % disability with use of RUE Goal status: INITIAL  3.  Pt to complete grip testing to determine appropriate goal once medically appropriate.  Baseline: Incision not fully approximated at time of eval Goal status: INITIAL  ASSESSMENT:  CLINICAL IMPRESSION: Patient is a 33 y.o. female who was seen today for occupational therapy evaluation for R CTR. Hx includes PTSD, major depressive disorder, morbid obesity, L CTR, L knee arthroplasty, hx of smoking, sleep apnea, and migraines. Patient currently presents below baseline level of functioning demonstrating functional deficits and impairments as noted below. Pt would benefit from skilled OT services in the outpatient setting to work on impairments as noted below to help pt return to PLOF as able.    PERFORMANCE DEFICITS: in functional skills including ADLs, IADLs, coordination, sensation, edema, ROM, strength, pain, Fine motor control, decreased knowledge of precautions, wound, skin integrity, and UE functional use.   IMPAIRMENTS: are limiting patient from ADLs, IADLs, work, and leisure.   COMORBIDITIES: may have co-morbidities  that affects occupational performance. Patient will benefit from skilled OT to address above impairments and improve overall function.  MODIFICATION OR  ASSISTANCE TO COMPLETE EVALUATION: Min-Moderate modification of tasks or assist with assess necessary to complete an evaluation.  OT OCCUPATIONAL PROFILE AND HISTORY: Detailed assessment: Review of records and additional review of physical, cognitive, psychosocial history related to current functional performance.  CLINICAL DECISION MAKING: Moderate - several treatment options, min-mod task modification necessary  REHAB POTENTIAL: Good  EVALUATION COMPLEXITY: Moderate      PLAN:  OT FREQUENCY: 1x/week  OT DURATION: 10 weeks  PLANNED INTERVENTIONS: 97168 OT Re-evaluation, 97535 self care/ADL training, 02889 therapeutic exercise, 97530 therapeutic activity, 97112 neuromuscular re-education, 97140 manual therapy, 97035 ultrasound, 97018 paraffin, 02960 fluidotherapy, 97010 moist heat, 97032 electrical stimulation (manual), 97750 Physical Performance Testing, 02239 Orthotic Initial, [id number] Orthotic/Prosthetic subsequent, scar mobilization, passive range of motion, coping strategies training, patient/family education, and DME and/or AE instructions  RECOMMENDED OTHER SERVICES: N/A for this visit  CONSULTED AND AGREED WITH PLAN OF CARE: Patient  PLAN FOR NEXT SESSION: review tendon glides Assess healing of surgical incision ROM hand and wrist  Jocelyn CHRISTELLA Bottom, OT 02/10/2024, 5:10 PM   For all possible CPT codes, reference the Planned Interventions line above.     Check all conditions that are expected to impact treatment: {Conditions expected to impact treatment:Morbid obesity, Diabetes mellitus, and Psychological or psychiatric disorders   If treatment provided at initial evaluation, no treatment charged due to lack of authorization.

## 2024-02-16 ENCOUNTER — Ambulatory Visit: Admitting: Occupational Therapy

## 2024-02-24 ENCOUNTER — Ambulatory Visit

## 2024-02-24 DIAGNOSIS — R278 Other lack of coordination: Secondary | ICD-10-CM

## 2024-02-24 DIAGNOSIS — R29898 Other symptoms and signs involving the musculoskeletal system: Secondary | ICD-10-CM

## 2024-02-24 DIAGNOSIS — M79641 Pain in right hand: Secondary | ICD-10-CM

## 2024-02-24 DIAGNOSIS — M6281 Muscle weakness (generalized): Secondary | ICD-10-CM

## 2024-02-24 NOTE — Patient Instructions (Addendum)
    Joint Protection  Respect pain/Develop pain awareness Monitor activities - stop to rest when discomfort or fatigue develops Awareness of pain for activities prior to 12-24 hrs Pain lingering 2 hrs after activity is a warning sign - modify or eliminate  Use larger joints and muscles Protect the delicate joints by using larger joints - use shoulder to carry bags not hands More proximal muscles and joints to perform ADL's (forearm/shoulder to open door-protect fingers) Groceries in paper bags Let the hip, elbow and arm support the grocery bag/push cart's weight Push up from seat with palms of hands, not back of fingers  Avoid tight/strong and prolonged grasp Build up handles (foam or cloth) on utensils, tools and pens Modify handles with levers Use AE for assistance for opening jars, pens and books, turning knobs and keys Change grasp to avoid static positioning Work with fingers extended over a large sponge rather than squeezing it Use open, flat palm to open jars, pump bottles for shampoo/soaps  Avoid carrying, lifting and using heavy objects Use a cart to move heavy objects Push heavy objects with hips instead of pulling with hands Distribute weight over many joints - use 2 hands to lift an item Use light weight tools and objects Use smaller containers of liquid or distribute liquids to smaller containers to lighten loads  Balance rest and activity Plan rest breaks ahead of time Avoid activities that cannot be stopped if needed Energy conservation techniques (sitting when able, planning ahead, organizing workspace and storage for accessibility, keeping most common used items within easy access, resting during activities, use timesavers like prepared foods) AE-Labor saving devices (modified cooking writing or cutting utensils, zipper pulls, button hooks, built up handle items, large grip pens, jar openers, key holder/turner, garden tools, and spring loaded scissors)  Use  splints as prescribes to protect joints

## 2024-02-24 NOTE — Therapy (Signed)
 OUTPATIENT OCCUPATIONAL THERAPY ORTHO TREATMENT  Patient Name: Kaitlyn Luna MRN: [id number] DOB:1991-01-30, 33 y.o., female Today's Date: 02/24/2024  PCP: [name], NP  REFERRING PROVIDER: [name], MD  END OF SESSION:  OT End of Session - 02/24/24 1317     Visit Number 2    Number of Visits 11    Date for Recertification  04/21/24    Authorization Type [employer] - auth required per appt note    OT Start Time 1227    OT Stop Time 1308    OT Time Calculation (min) 41 min    Equipment Utilized During Treatment dynamometer, red theraputty    Activity Tolerance Patient tolerated treatment well    Behavior During Therapy WFL for tasks assessed/performed          Past Medical History:  Diagnosis Date   Anemia    Anxiety    Asthma    Bipolar depression (HCC)    Depression    Headaches, cluster 04/2022   Kidney stone    Pneumonia 2017   PTSD (post-traumatic stress disorder)    Recurrent UTI    Sleep apnea    not using c-pap   Past Surgical History:  Procedure Laterality Date   BREAST SURGERY  2013   REDUCTI0N   CARPAL TUNNEL RELEASE Left    CERVICAL CONIZATION W/BX N/A 05/27/2017   Procedure: CONIZATION CERVIX WITH BIOPSY - COLD KNIFE;  Surgeon: [name], MD;  Location: [facility];  Service: Gynecology;  Laterality: N/A;   KNEE ARTHROSCOPY Left    TONSILLECTOMY     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   ONSET DATE: 01/20/2024 (Date of referral)  DOS - 01/27/2024   REFERRING  DIAG: G56.01 (ICD-10-CM) - Carpal tunnel syndrome, right upper limb   THERAPY DIAG:  Other lack of coordination  Muscle weakness (generalized)  Pain in right hand  Other symptoms and signs involving the musculoskeletal system  Rationale for Evaluation and Treatment: Rehabilitation  SUBJECTIVE:   SUBJECTIVE STATEMENT: Pt reports having increased pain in R thumb when completing driving tasks and digital opposition.  Pt accompanied by: self  PERTINENT HISTORY: PMH: PTSD, major depressive disorder, morbid obesity, L CTR, L knee arthroplasty, hx of smoking, sleep apnea, and migraines.   Per referring provider:  [name] is a 33 y.o. female who presents today for follow up 2 weeks status post right wrist open carpal tunnel release.  Doing well overall, does have some ongoing hypersensitivity at the incisional site.   Pertinent ROS were reviewed with the patient and found to be negative unless otherwise specified above in HPI.   Sutures removed today. She will be seen by occupational therapy to begin range of motion exercises and transition home exercise program when appropriate. Follow-up myself in approximate 4 weeks.   PRECAUTIONS: Follow IHP page 382  WEIGHT BEARING RESTRICTIONS: No  PAIN:  Are you having pain? Yes: NPRS scale: 4/10 Pain location: R wrist Pain description: throbbing Aggravating factors: night and with weather changes Relieving factors: medication and ice  FALLS: Has patient  fallen in last 6 months? 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 03/06/2024 at 8:45  OBJECTIVE:  Note: Objective measures were completed at Evaluation unless otherwise noted.  HAND DOMINANCE: Left  ADLs: Mod I  FUNCTIONAL OUTCOME MEASURES: Quick Dash: 59.1  % disability with use of RUE   UPPER EXTREMITY ROM:    RUE: WFL  HAND FUNCTION: Grip strength: Right: 16.3 lbs; Left: 54.2 lbs  COORDINATION: 9 Hole Peg test: Right: 25 sec; Left: 21 sec  SENSATION: Paresthesias in R hand reported  EDEMA: mild reported and observed  COGNITION: Overall cognitive status: Within functional limits for tasks assessed  OBSERVATIONS: Pt ambulates without use of AD. No loss of balance. The pt appears well kept. R palmar incision not fully approximated with trace clear drainage. No erythema or signs of infection.   TREATMENT: 02/24/24 Pt educated in scar tissue massage and purpose and importance of this.  See pt instructions for detailed handout that was provided to pt.  Pt has a small lump near scar area, did not feel like edema or scar tissue upon palpation. Pt reported it appeared not long after surgery, encouraged pt to mention this to [name] at followup on 8/27.  Obtained grip strength and modified goal addressing grip strength; see Objectives or goals for updated measure.  Pt also educated in joint protection and thumb CMC splints and urged to not obtain anything with less than a 4.2 star rating on [employer] and to avoid compression on palm.  Educated pt in theraputty HEP, see pt instructions for detailed handout. Pt also urged to stop if any causes undue pain, pt verbalized understanding.                                                                         PATIENT EDUCATION: Education details: Scar tissue massage, joint protection , theraputty HEP and care for putty Person educated: Patient Education method: Explanation, Demonstration, Verbal cues, and Handouts Education comprehension: verbalized understanding, returned demonstration, verbal cues required, and needs further education  HOME EXERCISE PROGRAM: 02/10/2024: tendon glides 02/24/24: Theraputty with red putty  GOALS:  SHORT TERM GOALS: Target date: 03/09/2024    Patient will  demonstrate initial R UE HEP with 25% verbal cues or less for proper execution. Baseline: initiated tendon glides Goal status: INITIAL  2.  Pt will independently recall the 5 main sensory precautions (cold, heat, sharp, chemical, and heavy) as needed to prevent injury/harm secondary to impairments.   Baseline:  Goal status: INITIAL  3.  Pt will independently recall at least 3 joint protection, ergonomics, and body mechanic principles as noted in pt instructions.   Baseline:  Goal status: INITIAL   LONG TERM GOALS: Target date: 04/21/2024  Patient will demonstrate updated R UE HEP with visual handouts only for proper execution. Baseline:  Goal status: INITIAL  2.  Patient will demonstrate at least 16% improvement with quick Dash score (reporting 43.1% disability or less) indicating improved functional use of affected extremity. Baseline: 59.1 % disability with use of  RUE Goal status: INITIAL  3.  Pt to increase R grip strength by at least 30 pounds for improved functional use in dominant hand. Baseline: 16.3 lbs Goal status: MODIFIED  ASSESSMENT:  CLINICAL IMPRESSION: Patient is a 33 y.o. female who was seen today for occupational therapy tx for R CTR. Hx includes PTSD, major depressive disorder, morbid obesity, L CTR, L knee arthroplasty, hx of smoking, sleep apnea, and migraines. Patient currently presents below baseline level of functioning demonstrating functional deficits and impairments d/t pain in R hand affecting R grpi strength and dexterity. Pt demonstrated good understanding of scar tissue massage and joint protection strategies, and motivated to improve grip strength in R hand. Pt at this time would benefit from continued skilled services to continue to educate in recovery process post CTR and continue to address impaired grip strength and educate in AE for reducing undue pressure on affected joints and median nerve.     PERFORMANCE DEFICITS: in functional skills  including ADLs, IADLs, coordination, sensation, edema, ROM, strength, pain, Fine motor control, decreased knowledge of precautions, wound, skin integrity, and UE functional use.   IMPAIRMENTS: are limiting patient from ADLs, IADLs, work, and leisure.   COMORBIDITIES: may have co-morbidities  that affects occupational performance. Patient will benefit from skilled OT to address above impairments and improve overall function.  MODIFICATION OR ASSISTANCE TO COMPLETE EVALUATION: Min-Moderate modification of tasks or assist with assess necessary to complete an evaluation.  OT OCCUPATIONAL PROFILE AND HISTORY: Detailed assessment: Review of records and additional review of physical, cognitive, psychosocial history related to current functional performance.  CLINICAL DECISION MAKING: Moderate - several treatment options, min-mod task modification necessary  REHAB POTENTIAL: Good  EVALUATION COMPLEXITY: Moderate      PLAN:  OT FREQUENCY: 1x/week  OT DURATION: 10 weeks  PLANNED INTERVENTIONS: 97168 OT Re-evaluation, 97535 self care/ADL training, 02889 therapeutic exercise, 97530 therapeutic activity, 97112 neuromuscular re-education, 97140 manual therapy, 97035 ultrasound, 97018 paraffin, 02960 fluidotherapy, 97010 moist heat, 97032 electrical stimulation (manual), 97750 Physical Performance Testing, 02239 Orthotic Initial, [id number] Orthotic/Prosthetic subsequent, scar mobilization, passive range of motion, coping strategies training, patient/family education, and DME and/or AE instructions  RECOMMENDED OTHER SERVICES: N/A for this visit  CONSULTED AND AGREED WITH PLAN OF CARE: Patient  PLAN FOR NEXT SESSION: modalities as appropriate F/u on putty and CMC orthotic Grip strengthening activities AE recommendations  Rocky Dutch, OT 02/24/2024, 1:19 PM   For all possible CPT codes, reference the Planned Interventions line above.     Check all conditions that are expected to impact  treatment: {Conditions expected to impact treatment:Morbid obesity, Diabetes mellitus, and Psychological or psychiatric disorders   If treatment provided at initial evaluation, no treatment charged due to lack of authorization.

## 2024-03-02 ENCOUNTER — Ambulatory Visit

## 2024-03-06 ENCOUNTER — Ambulatory Visit: Admitting: Orthopedic Surgery

## 2024-03-07 ENCOUNTER — Other Ambulatory Visit: Payer: Self-pay | Admitting: Medical Genetics

## 2024-03-07 DIAGNOSIS — Z006 Encounter for examination for normal comparison and control in clinical research program: Secondary | ICD-10-CM

## 2024-03-09 ENCOUNTER — Ambulatory Visit: Admitting: Occupational Therapy

## 2024-03-09 DIAGNOSIS — M79641 Pain in right hand: Secondary | ICD-10-CM

## 2024-03-09 DIAGNOSIS — M6281 Muscle weakness (generalized): Secondary | ICD-10-CM

## 2024-03-09 DIAGNOSIS — R29818 Other symptoms and signs involving the nervous system: Secondary | ICD-10-CM

## 2024-03-09 DIAGNOSIS — R278 Other lack of coordination: Secondary | ICD-10-CM | POA: Diagnosis not present

## 2024-03-09 DIAGNOSIS — R208 Other disturbances of skin sensation: Secondary | ICD-10-CM

## 2024-03-09 DIAGNOSIS — R29898 Other symptoms and signs involving the musculoskeletal system: Secondary | ICD-10-CM

## 2024-03-09 NOTE — Therapy (Signed)
 OUTPATIENT OCCUPATIONAL THERAPY ORTHO TREATMENT  Patient Name: Kaitlyn Luna MRN: [id number] DOB:[birth year], 33 y.o., female Today's Date: 03/09/2024  PCP: [name], NP  REFERRING PROVIDER: [name], MD  END OF SESSION:  OT End of Session - 03/09/24 0936     Visit Number 3    Number of Visits 11    Date for Recertification  04/21/24    Authorization Type [employer] - auth required per appt note    OT Start Time 0935    OT Stop Time 1013    OT Time Calculation (min) 38 min    Activity Tolerance Patient tolerated treatment well    Behavior During Therapy [facility] for tasks assessed/performed         Past Medical History:  Diagnosis Date   Anemia    Anxiety    Asthma    Bipolar depression (HCC)    Depression    Headaches, cluster 04/2022   Kidney stone    Pneumonia 2017   PTSD (post-traumatic stress disorder)    Recurrent UTI    Sleep apnea    not using c-pap   Past Surgical History:  Procedure Laterality Date   BREAST SURGERY  2013   REDUCTI0N   CARPAL TUNNEL RELEASE Left    CERVICAL CONIZATION W/BX N/A 05/27/2017   Procedure: CONIZATION CERVIX WITH BIOPSY - COLD KNIFE;  Surgeon: [name], MD;  Location: [facility];  Service: Gynecology;  Laterality: N/A;   KNEE ARTHROSCOPY Left    TONSILLECTOMY     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   ONSET DATE: 01/20/2024 (Date of referral)  DOS - 01/27/2024   REFERRING DIAG: G56.01 (ICD-10-CM) - Carpal tunnel syndrome, right upper limb    THERAPY DIAG:  Other lack of coordination  Muscle weakness (generalized)  Pain in right hand  Other symptoms and signs involving the musculoskeletal system  Other disturbances of skin sensation  Other symptoms and signs involving the nervous system  Rationale for Evaluation and Treatment: Rehabilitation  SUBJECTIVE:   SUBJECTIVE STATEMENT: Pt reports she had a tooth pulled last week.  She forgot her putty today.   Pt accompanied by: self  PERTINENT HISTORY: PMH: PTSD, major depressive disorder, morbid obesity, L CTR, L knee arthroplasty, hx of smoking, sleep apnea, and migraines.   Per referring provider:  Sharanda Shinault is a 33 y.o. female who presents today for follow up 2 weeks status post right wrist open carpal tunnel release.  Doing well overall, does have some ongoing hypersensitivity at the incisional site.   Pertinent ROS were reviewed with the patient and found to be negative unless otherwise specified above in HPI.   Sutures removed today. She will be seen by occupational therapy to begin range of motion exercises and transition home exercise program when appropriate. Follow-up myself in approximate 4 weeks.   PRECAUTIONS: Follow IHP page 382  WEIGHT BEARING RESTRICTIONS: No  PAIN:  Are you having pain? Yes: NPRS scale: 0/10 Pain location: R wrist Pain description: throbbing Aggravating factors: night and with weather changes; driving Relieving factors: medication  and ice  Worst pain in last 24 hours: 4/10  FALLS: Has patient fallen in last 6 months? 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 03/06/2024 at 8:45  OBJECTIVE:  Note: Objective measures were completed at Evaluation unless otherwise noted.  HAND DOMINANCE:  Left  ADLs: Mod I  FUNCTIONAL OUTCOME MEASURES: Quick Dash: 59.1 % disability with use of RUE   UPPER EXTREMITY ROM:    RUE: WFL  HAND FUNCTION: Grip strength: Right: 16.3 lbs; Left: 54.2 lbs  COORDINATION: 9 Hole Peg test: Right: 25 sec; Left: 21 sec  SENSATION: Paresthesias in R hand reported  EDEMA: mild reported and observed  COGNITION: Overall cognitive status: Within functional limits for tasks assessed  OBSERVATIONS: Pt ambulates without use of AD. No loss of balance. The pt appears well kept. R palmar incision not fully approximated with trace clear drainage. No erythema or signs of infection.   TREATMENT:  - Self-care/home management completed for duration as noted below including: OT reviewed scar tissue massage with pt requiring mod cues for proper completion. Pt provided with piece of Dycem for home completion.  OT reviewed tendon glides, which pt independently recalled as needed to reduce pain and stiffness in hand. Pt was encouraged to complete as frequently as possible - Manual therapy completed for duration as noted below including:  Performed dynamic cupping at site of incision and surrounding area to decrease scar tissue adhesions by mobilizing the soft- tissues such as skin, fascia, neural tissues, muscles, ligaments and tendons and to promote local circulation necessary for optimal functional movement by lifting and separating the tissue underneath the cup. Improved appearance to incision noted upon completion with less palpable adhesions.  Therapist completed IASTM using edge mobility tool at site of incision using free up lotion as emollient for reduction of scar tissue and to promote improved AROM and pain reduction of affected extremity. Improved appearance to incision noted upon completion with less palpable adhesions.  - Ultrasound completed for duration as noted below including:  Ultrasound applied to palmar right hand for 8 minutes, frequency of 3  MHz, 20% duty cycle, and 1.1 W/cm with pt's arm placed on soft towel for promotion of ROM, edema reduction, and pain reduction in affected extremity.                                                                     PATIENT EDUCATION: Education details: Scar tissue massage; US  Person educated: Patient Education method: Explanation, Demonstration, and Verbal cues Education comprehension: verbalized understanding, returned demonstration, verbal cues required, and needs further education  HOME EXERCISE PROGRAM: 02/10/2024: tendon glides 02/24/24: Theraputty with red putty  GOALS:  SHORT TERM GOALS: Target date: 03/09/2024    Patient will demonstrate initial R UE HEP with 25% verbal cues or less for proper execution. Baseline: initiated tendon glides Goal status: MET  2.  Pt will independently recall the 5 main sensory precautions (cold, heat, sharp, chemical, and heavy) as needed to prevent injury/harm secondary to impairments.   Baseline:  Goal status: INITIAL  3.  Pt will independently recall at least 3 joint protection, ergonomics, and body mechanic principles as noted in pt instructions.   Baseline:  Goal status: INITIAL   LONG TERM GOALS: Target date: 04/21/2024  Patient will demonstrate updated R UE HEP with visual handouts only for proper execution. Baseline:  Goal status: INITIAL  2.  Patient will demonstrate at least 16% improvement with quick Dash score (reporting 43.1% disability or less) indicating improved functional use of affected extremity. Baseline: 59.1 % disability with use of RUE Goal status: INITIAL  3.  Pt to increase R grip strength by at least 30 pounds for improved functional use in dominant hand. Baseline: 16.3 lbs Goal status: MODIFIED  ASSESSMENT:  CLINICAL IMPRESSION: Pt demonstrates good understanding of HEP though lacks consistent carryover outside of clinic. Overall, pt progressing towards goals as expected but will benefit from further  scar management, desensitization, and strengthening.   PERFORMANCE DEFICITS: in functional skills including ADLs, IADLs, coordination, sensation, edema, ROM, strength, pain, Fine motor control, decreased knowledge of precautions, wound, skin integrity, and UE functional use.   IMPAIRMENTS: are limiting patient from ADLs, IADLs, work, and leisure.   COMORBIDITIES: may have co-morbidities  that affects occupational performance. Patient will benefit from skilled OT to address above impairments and improve overall function.  REHAB POTENTIAL: Good  PLAN:  OT FREQUENCY: 1x/week  OT DURATION: 10 weeks  PLANNED INTERVENTIONS: 97168 OT Re-evaluation, 97535 self care/ADL training, 02889 therapeutic exercise, 97530 therapeutic activity, 97112 neuromuscular re-education, 97140 manual therapy, 97035 ultrasound, 97018 paraffin, 02960 fluidotherapy, 97010 moist heat, 97032 electrical stimulation (manual), 97750 Physical Performance Testing, 02239 Orthotic Initial, [id number] Orthotic/Prosthetic subsequent, scar mobilization, passive range of motion, coping strategies training, patient/family education, and DME and/or AE instructions  RECOMMENDED OTHER SERVICES: N/A for this visit  CONSULTED AND AGREED WITH PLAN OF CARE: Patient  PLAN FOR NEXT SESSION: modalities as appropriate F/u on putty and CMC orthotic Grip strengthening activities AE recommendations; sensory precautions, joint protection  Jocelyn CHRISTELLA Bottom, OT 03/09/2024, 10:23 AM   For all possible CPT codes, reference the Planned Interventions line above.     Check all conditions that are expected to impact treatment: {Conditions expected to impact treatment:Morbid obesity, Diabetes mellitus, and Psychological or psychiatric disorders   If treatment provided at initial evaluation, no treatment charged due to lack of authorization.

## 2024-03-13 ENCOUNTER — Encounter: Payer: Self-pay | Admitting: Radiology

## 2024-03-16 ENCOUNTER — Ambulatory Visit: Attending: Orthopedic Surgery | Admitting: Occupational Therapy

## 2024-03-16 DIAGNOSIS — M79641 Pain in right hand: Secondary | ICD-10-CM | POA: Insufficient documentation

## 2024-03-16 DIAGNOSIS — R29818 Other symptoms and signs involving the nervous system: Secondary | ICD-10-CM | POA: Diagnosis present

## 2024-03-16 DIAGNOSIS — R278 Other lack of coordination: Secondary | ICD-10-CM | POA: Insufficient documentation

## 2024-03-16 DIAGNOSIS — R208 Other disturbances of skin sensation: Secondary | ICD-10-CM | POA: Diagnosis present

## 2024-03-16 DIAGNOSIS — R29898 Other symptoms and signs involving the musculoskeletal system: Secondary | ICD-10-CM | POA: Diagnosis present

## 2024-03-16 DIAGNOSIS — M6281 Muscle weakness (generalized): Secondary | ICD-10-CM | POA: Insufficient documentation

## 2024-03-16 NOTE — Therapy (Signed)
 OUTPATIENT OCCUPATIONAL THERAPY ORTHO TREATMENT  Patient Name: Kaitlyn Luna MRN: [id number] DOB:1990/12/11, 33 y.o., female Today's Date: 03/16/2024  PCP: [name], NP  REFERRING PROVIDER: [name], MD  END OF SESSION:  OT End of Session - 03/16/24 1027     Visit Number 4    Number of Visits 11    Date for Recertification  04/21/24    Authorization Type [employer] - auth required per appt note    OT Start Time 1020    OT Stop Time 1100    OT Time Calculation (min) 40 min    Activity Tolerance Patient tolerated treatment well    Behavior During Therapy [facility] for tasks assessed/performed         Past Medical History:  Diagnosis Date   Anemia    Anxiety    Asthma    Bipolar depression (HCC)    Depression    Headaches, cluster 04/2022   Kidney stone    Pneumonia 2017   PTSD (post-traumatic stress disorder)    Recurrent UTI    Sleep apnea    not using c-pap   Past Surgical History:  Procedure Laterality Date   BREAST SURGERY  2013   REDUCTI0N   CARPAL TUNNEL RELEASE Left    CERVICAL CONIZATION W/BX N/A 05/27/2017   Procedure: CONIZATION CERVIX WITH BIOPSY - COLD KNIFE;  Surgeon: [name], MD;  Location: [facility];  Service: Gynecology;  Laterality: N/A;   KNEE ARTHROSCOPY Left    TONSILLECTOMY     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   ONSET DATE: 01/20/2024 (Date of referral)  DOS - 01/27/2024   REFERRING DIAG: G56.01 (ICD-10-CM) - Carpal tunnel syndrome, right upper limb    THERAPY DIAG:  Other lack of coordination  Muscle weakness (generalized)  Pain in right hand  Other symptoms and signs involving the musculoskeletal system  Other disturbances of skin sensation  Other symptoms and signs involving the nervous system  Rationale for Evaluation and Treatment: Rehabilitation  SUBJECTIVE:   SUBJECTIVE STATEMENT: Pt reports she is having a lot more feeling today.   Pt accompanied by: self  PERTINENT HISTORY: PMH: PTSD, major depressive disorder, morbid obesity, L CTR, L knee arthroplasty, hx of smoking, sleep apnea, and migraines.   Per referring provider:  Mozetta Murfin is a 33 y.o. female who presents today for follow up 2 weeks status post right wrist open carpal tunnel release.  Doing well overall, does have some ongoing hypersensitivity at the incisional site.   Pertinent ROS were reviewed with the patient and found to be negative unless otherwise specified above in HPI.   Sutures removed today. She will be seen by occupational therapy to begin range of motion exercises and transition home exercise program when appropriate. Follow-up myself in approximate 4 weeks.   PRECAUTIONS: Follow IHP page 382  WEIGHT BEARING RESTRICTIONS: No  PAIN:  Are you having pain? Yes: NPRS scale: 0/10 Pain location: R wrist Pain description: throbbing Aggravating factors: night and with weather changes; driving Relieving factors: medication and ice  Worst pain  in last 24 hours: 4/10  FALLS: Has patient fallen in last 6 months? 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 03/06/2024 at 8:45  OBJECTIVE:  Note: Objective measures were completed at Evaluation unless otherwise noted.  HAND DOMINANCE: Left  ADLs: Mod I  FUNCTIONAL  OUTCOME MEASURES: Quick Dash: 59.1 % disability with use of RUE   UPPER EXTREMITY ROM:    RUE: WFL  HAND FUNCTION: Grip strength: Right: 16.3 lbs; Left: 54.2 lbs  COORDINATION: 9 Hole Peg test: Right: 25 sec; Left: 21 sec  SENSATION: Paresthesias in R hand reported  EDEMA: mild reported and observed  COGNITION: Overall cognitive status: Within functional limits for tasks assessed  OBSERVATIONS: Pt ambulates without use of AD. No loss of balance. The pt appears well kept. R palmar incision not fully approximated with trace clear drainage. No erythema or signs of infection.   TREATMENT:  - Self-care/home management completed for duration as noted below including: OT reviewed scar tissue massage with pt requiring min cues for proper completion. Pt provided with additional piece of Dycem for home completion and fabricated with elastomer insert for brace of stockinet.  OT reviewed brace options for R wrist to be worn at rest for additional symptom management and to promote proper sleep positioning.  - Manual therapy completed for duration as noted below including:  Performed dynamic cupping at site of incision and surrounding area to decrease scar tissue adhesions by mobilizing the soft- tissues such as skin, fascia, neural tissues, muscles, ligaments and tendons and to promote local circulation necessary for optimal functional movement by lifting and separating the tissue underneath the cup. Improved appearance to incision noted upon completion with less palpable adhesions.  Therapist completed IASTM using edge mobility tool at site of incision using free up lotion as emollient for reduction of scar tissue and to promote improved AROM and pain reduction of affected extremity. Improved appearance to incision noted upon completion with less palpable adhesions.                                                             PATIENT EDUCATION: Education details: Scar tissue massage; brace  options Person educated: Patient Education method: Explanation, Demonstration, and Verbal cues Education comprehension: verbalized understanding, returned demonstration, verbal cues required, and needs further education  HOME EXERCISE PROGRAM: 02/10/2024: tendon glides 02/24/24: Theraputty with red putty  GOALS:  SHORT TERM GOALS: Target date: 03/09/2024    Patient will demonstrate initial R UE HEP with 25% verbal cues or less for proper execution. Baseline: initiated tendon glides Goal status: MET  2.  Pt will independently recall the 5 main sensory precautions (cold, heat, sharp, chemical, and heavy) as needed to prevent injury/harm secondary to impairments.   Baseline:  Goal status: INITIAL  3.  Pt will independently recall at least 3 joint protection, ergonomics, and body mechanic principles as noted in pt instructions.   Baseline:  Goal status: INITIAL   LONG TERM GOALS: Target date: 04/21/2024  Patient will demonstrate updated R UE HEP with visual handouts only for proper execution. Baseline:  Goal status:  INITIAL  2.  Patient will demonstrate at least 16% improvement with quick Dash score (reporting 43.1% disability or less) indicating improved functional use of affected extremity. Baseline: 59.1 % disability with use of RUE Goal status: INITIAL  3.  Pt to increase R grip strength by at least 30 pounds for improved functional use in dominant hand. Baseline: 16.3 lbs Goal status: MODIFIED  ASSESSMENT:  CLINICAL IMPRESSION: Pt demonstrates good tolerance to manual therapy with improved sensation over area of incision. Pt could benefit from L wrist brace for night time wear to further promote improvement to symptoms and proper positioning of wrist. Recommend focusing on proprioception, ROM, and tolerance to vibration during upcoming visits.   PERFORMANCE DEFICITS: in functional skills including ADLs, IADLs, coordination, sensation, edema, ROM, strength, pain, Fine  motor control, decreased knowledge of precautions, wound, skin integrity, and UE functional use.   IMPAIRMENTS: are limiting patient from ADLs, IADLs, work, and leisure.   COMORBIDITIES: may have co-morbidities  that affects occupational performance. Patient will benefit from skilled OT to address above impairments and improve overall function.  REHAB POTENTIAL: Good  PLAN:  OT FREQUENCY: 1x/week  OT DURATION: 10 weeks  PLANNED INTERVENTIONS: 97168 OT Re-evaluation, 97535 self care/ADL training, 02889 therapeutic exercise, 97530 therapeutic activity, 97112 neuromuscular re-education, 97140 manual therapy, 97035 ultrasound, 97018 paraffin, 02960 fluidotherapy, 97010 moist heat, 97032 electrical stimulation (manual), 97750 Physical Performance Testing, 02239 Orthotic Initial, [id number] Orthotic/Prosthetic subsequent, scar mobilization, passive range of motion, coping strategies training, patient/family education, and DME and/or AE instructions  RECOMMENDED OTHER SERVICES: N/A for this visit  CONSULTED AND AGREED WITH PLAN OF CARE: Patient  PLAN FOR NEXT SESSION: modalities as appropriate - fluido True balance, Looper, Mindful Maze, Jux-a-cisor, power web push downs F/u on putty and CMC orthotic Grip strengthening activities AE recommendations; sensory precautions, joint protection  Jocelyn CHRISTELLA Bottom, OT 03/16/2024, 11:34 AM   For all possible CPT codes, reference the Planned Interventions line above.     Check all conditions that are expected to impact treatment: {Conditions expected to impact treatment:Morbid obesity, Diabetes mellitus, and Psychological or psychiatric disorders   If treatment provided at initial evaluation, no treatment charged due to lack of authorization.

## 2024-03-23 ENCOUNTER — Ambulatory Visit: Admitting: Occupational Therapy

## 2024-03-29 ENCOUNTER — Ambulatory Visit: Admitting: Orthopedic Surgery

## 2024-03-30 ENCOUNTER — Telehealth: Payer: Self-pay | Admitting: Occupational Therapy

## 2024-03-30 ENCOUNTER — Ambulatory Visit: Admitting: Occupational Therapy

## 2024-03-30 NOTE — Telephone Encounter (Signed)
 This is to document my attempt to call patient after no-show for OT appt this AM.  This is patient's # 4th missed appt, although 1st No-show without telephone contact.   Primary phone number(s) was used in efforts to contact the patient.   Spoke to patient to remind them of upcoming therapy visit with pt confirming appt 04/05/24 at 10:15 AM .  Pt reports she is not hurting like she was and will discuss with primary therapist progress to date and DC plans.

## 2024-04-03 ENCOUNTER — Telehealth: Payer: Self-pay

## 2024-04-03 ENCOUNTER — Ambulatory Visit
Admission: RE | Admit: 2024-04-03 | Discharge: 2024-04-03 | Disposition: A | Discharge status: Home / Self Care | Attending: Student | Admitting: Student

## 2024-04-03 ENCOUNTER — Other Ambulatory Visit: Payer: Self-pay

## 2024-04-03 VITALS — BP 109/75 | HR 74 | Temp 98.3°F | Resp 18

## 2024-04-03 DIAGNOSIS — G44019 Episodic cluster headache, not intractable: Secondary | ICD-10-CM | POA: Diagnosis not present

## 2024-04-03 DIAGNOSIS — J4521 Mild intermittent asthma with (acute) exacerbation: Secondary | ICD-10-CM | POA: Diagnosis not present

## 2024-04-03 MED ORDER — IPRATROPIUM-ALBUTEROL 0.5-2.5 (3) MG/3ML IN SOLN
3.0000 mL | Freq: Once | RESPIRATORY_TRACT | Status: AC
  Administered 2024-04-03: 3 mL via RESPIRATORY_TRACT

## 2024-04-03 MED ORDER — PREDNISONE 10 MG (21) PO TBPK: ORAL_TABLET | Freq: Every day | ORAL | 0 refills | Status: AC

## 2024-04-03 MED ORDER — KETOROLAC TROMETHAMINE 30 MG/ML IJ SOLN
30.0000 mg | Freq: Once | INTRAMUSCULAR | Status: AC
  Administered 2024-04-03: 30 mg via INTRAMUSCULAR

## 2024-04-03 NOTE — ED Provider Notes (Signed)
 [facility]    CSN: [id number] Arrival date & time: 04/03/24  0941      History   Chief Complaint Chief Complaint  Patient presents with   Dizziness    Entered by patient   Shortness of Breath    HPI Kaitlyn Luna is a 33 y.o. female presenting with dizziness and shortness of breath. History asthma. Pt presents with complaints of lightheadedness and DOE. Symptoms began yesterday at 4 PM (19 hours ago). States she has been very congested since Friday 11/21, with thick nasal congestion, PND, and sinus pressure. Sore throat. Cough productive of green and clear sputum. Denies SOB at rest, but endorses DOE. Denies lightheadedness at rest, but endorses with walking. Has been using her albuterol  inhaler, but minimal improvement with 4 puffs. Reports she works in [employer], very dusty. Triggers her asthma. Tylenol  taken too, little improvement. No fevers. History cluster headaches. Endorses current headache, consistent with typical cluster headache. She is out of her sumatriptan . Denies worst HA of life, vision changes, weakness, unilateral weakness.     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    Past Surgical History:  Procedure  Laterality Date   BREAST SURGERY  2013   [id number]   CARPAL TUNNEL RELEASE Left    CERVICAL CONIZATION W/BX N/A 05/27/2017   Procedure: CONIZATION CERVIX WITH BIOPSY - COLD KNIFE;  Surgeon: [name], MD;  Location: [facility];  Service: Gynecology;  Laterality: N/A;   KNEE ARTHROSCOPY Left    TONSILLECTOMY      OB History     Gravida  0   Para  0   Term  0   Preterm  0   AB  0   Living  0      SAB  0   IAB  0   Ectopic  0   Multiple  0   Live Births               Home Medications    Prior to Admission medications   Medication Sig Start Date End Date Taking? Authorizing Provider  predniSONE  (STERAPRED UNI-PAK 21 TAB) 10 MG (21) TBPK tablet Take by mouth daily. Take 6 tabs by mouth daily  for 2 days, then 5 tabs for 2 days, then 4 tabs for 2 days, then 3 tabs for 2 days, 2 tabs for 2 days, then 1 tab by mouth daily for 2 days 04/03/24  Yes Arlyss Leita BRAVO, PA-C  albuterol  (VENTOLIN  HFA) 108 (90 Base) MCG/ACT inhaler Inhale 1-2 puffs into the lungs every 6 (six) hours as needed for wheezing or shortness of breath. 04/19/23   Mayer, Jodi R, NP  Etonogestrel  (NEXPLANON  [place]) Inject into the skin.    [provider]  fluticasone  (FLOVENT  HFA) 110 MCG/ACT inhaler Inhale 1 puff into the lungs in the morning and at bedtime. 04/19/23   Mayer, Jodi R, NP  ondansetron  (ZOFRAN -ODT) 4 MG disintegrating tablet Take 1 tablet (4 mg total) by mouth every 8 (eight) hours as needed. 06/01/23   Horton, Charmaine FALCON, MD  oxycodone  (OXY-IR) 5 MG capsule Take 1 capsule (5 mg total) by mouth every 6 (six) hours as needed. 01/28/24   Agarwala, Gildardo, MD  promethazine -dextromethorphan (PROMETHAZINE -DM) 6.25-15 MG/5ML syrup Take 5 mLs by mouth 3 (three) times daily as needed for cough. 08/21/22   Christopher Savannah, PA-C  pseudoephedrine  (SUDAFED) 60 MG tablet Take 1 tablet (60 mg total) by mouth every 8 (eight) hours as needed for congestion. 08/21/22   Christopher Savannah, PA-C  SUMAtriptan  (IMITREX ) 50  MG tablet Take 1 tablet (50 mg total) by mouth as needed for headache (take as needed for your headaches, try to take near onset of a severe headache to keep it from progressing). May repeat in 2 hours if headache persists or recurs. 04/17/22   [name], NP    Family History Family History  Problem Relation Age of Onset   Arthritis Mother    Diabetes Father    Alzheimer's disease Father    Alzheimer's disease Maternal Grandfather    Stroke Paternal Grandmother    Dementia Paternal Grandfather     Social History Social History   Tobacco Use   Smoking status: Former    Current packs/day: 0.00    Types: Cigarettes    Quit date: 12/07/2015    Years since quitting: 8.3   Smokeless tobacco: Never  Vaping Use   Vaping status: Never Used  Substance Use Topics   Alcohol use: Not Currently    Comment: occ   Drug use: No     Allergies   Lavender oil   Review of Systems Review of Systems  Constitutional:  Negative for appetite change, chills and fever.  HENT:  Positive for congestion and sore throat. Negative for ear pain, rhinorrhea, sinus pressure and sinus pain.   Eyes:  Negative for redness and visual disturbance.  Respiratory:  Positive for cough and shortness of breath. Negative for chest tightness and wheezing.   Cardiovascular:  Negative for chest pain and palpitations.  Gastrointestinal:  Negative for abdominal pain, constipation, diarrhea, nausea and vomiting.  Genitourinary:  Negative for dysuria, frequency and urgency.  Musculoskeletal:  Negative for myalgias.  Neurological:  Positive for headaches. Negative for dizziness and weakness.  Psychiatric/Behavioral:  Negative for confusion.   All other systems reviewed and are negative.    Physical Exam Triage Vital Signs ED Triage Vitals  Encounter Vitals Group     BP 04/03/24 0955 109/75     Girls Systolic BP Percentile --      Girls Diastolic BP Percentile --      Boys Systolic BP Percentile --       Boys Diastolic BP Percentile --      Pulse Rate 04/03/24 0955 74     Resp 04/03/24 0955 18     Temp 04/03/24 0955 98.3 F (36.8 C)     Temp Source 04/03/24 0955 Oral     SpO2 04/03/24 0955 98 %     Weight --      Height --      Head Circumference --      Peak Flow --  Pain Score 04/03/24 1003 5     Pain Loc --      Pain Education --      Exclude from Growth Chart --    No data found.  Updated Vital Signs BP 109/75 (BP Location: Right Arm)   Pulse 74   Temp 98.3 F (36.8 C) (Oral)   Resp 18   LMP 02/09/2024 (Exact Date)   SpO2 98%   Visual Acuity Right Eye Distance:   Left Eye Distance:   Bilateral Distance:    Right Eye Near:   Left Eye Near:    Bilateral Near:     Physical Exam Vitals reviewed.  Constitutional:      General: She is not in acute distress.    Appearance: Normal appearance. She is not ill-appearing.  HENT:     Head: Normocephalic and atraumatic.     Right Ear: Tympanic membrane, ear canal and external ear normal. No tenderness. No middle ear effusion. There is no impacted cerumen. Tympanic membrane is not perforated, erythematous, retracted or bulging.     Left Ear: Tympanic membrane, ear canal and external ear normal. No tenderness.  No middle ear effusion. There is no impacted cerumen. Tympanic membrane is not perforated, erythematous, retracted or bulging.     Nose: Nose normal. No congestion.     Mouth/Throat:     Mouth: Mucous membranes are moist.     Pharynx: Uvula midline. No oropharyngeal exudate or posterior oropharyngeal erythema.     Tonsils: No tonsillar exudate.     Comments: Tonsils surgically absent. Eyes:     General: No visual field deficit.    Extraocular Movements: Extraocular movements intact.     Pupils: Pupils are equal, round, and reactive to light.  Cardiovascular:     Rate and Rhythm: Normal rate and regular rhythm.     Heart sounds: Normal heart sounds.  Pulmonary:     Effort: Pulmonary effort is normal.      Breath sounds: Normal breath sounds. No decreased breath sounds, wheezing, rhonchi or rales.     Comments: Lungs clear to auscultation  Abdominal:     Palpations: Abdomen is soft.     Tenderness: There is no abdominal tenderness. There is no guarding or rebound.  Lymphadenopathy:     Cervical: No cervical adenopathy.     Right cervical: No superficial, deep or posterior cervical adenopathy.    Left cervical: No superficial, deep or posterior cervical adenopathy.  Skin:    Comments: No rash   Neurological:     General: No focal deficit present.     Mental Status: She is alert and oriented to person, place, and time.     Cranial Nerves: Cranial nerves 2-12 are intact. No facial asymmetry.     Comments: CN 2-12 intact.   Psychiatric:        Mood and Affect: Mood normal.        Behavior: Behavior normal.        Thought Content: Thought content normal.        Judgment: Judgment normal.      UC Treatments / Results  Labs (all labs ordered are listed, but only abnormal results are displayed) Labs Reviewed - No data to display  EKG   Radiology No results found.  Procedures Procedures (including critical care time)  Medications Ordered in UC Medications  ipratropium-albuterol  (DUONEB) 0.5-2.5 (3) MG/3ML nebulizer solution 3 mL (3 mLs Nebulization Given 04/03/24 1034)  ketorolac  (TORADOL ) 30 MG/ML injection 30 mg (  30 mg Intramuscular Given 04/03/24 1032)    Initial Impression / Assessment and Plan / UC Course  I have reviewed the triage vital signs and the nursing notes.  Pertinent labs & imaging results that were available during my care of the patient were reviewed by me and considered in my medical decision making (see chart for details).     Patient is a 33 year old female presenting with asthma exacerbation.  She is afebrile and nontachycardic.  Antipyretic has not been administered. Implant for contraception.  At initial exam, decreased breath sounds throughout.   Following DuoNeb, lungs are clear to auscultation, and the patient notes improvement in breathing.  I have a low suspicion for pneumonia, considering normal vitals, breath sounds clear throughout, cough is not productive of dark or purulent sputum.  Current cluster headache consistent with her typical cluster headache. Reassuring neuro exam. No vision changes or weakness. Has not had NSAIDs today. Following Toradol  IM, patient notes significant improvement in pain.   Will manage asthma exacerbation with prednisone  taper.  She is not a diabetic.  Continue albuterol  as needed.  She will follow-up with her primary care provider to discuss possibility of daily inhaler, or pulmonology referral.  Strict return precautions as below.  Final Clinical Impressions(s) / UC Diagnoses   Final diagnoses:  Mild intermittent asthma with acute exacerbation  Episodic cluster headache, not intractable     Discharge Instructions      -Prednisone  taper for cough/asthma. I recommend taking this in the morning as it could give you energy.  Avoid NSAIDs like ibuprofen  and alleve while taking this medication as they can increase your risk of stomach upset and even GI bleeding when in combination with a steroid. You can continue tylenol  (acetaminophen ) up to 1000mg  3x daily. -Albuterol  inhaler that you have at home already as needed for cough, wheezing, shortness of breath, 1 to 2 puffs every 6 hours as needed. -Follow-up with PCP to discuss daily steroid inhaler or pulm referral - We administered a shot of Toradol  today.  This is an NSAID pain medication, similar to ibuprofen . - Because we administered the Toradol  today, please avoid NSAIDs like ibuprofen  or Advil  for the rest of the day. -You can still take Tylenol  1000mg  up to 3x daily -Follow-up in 2 days if your symptoms have not improved, or have worsened. Your cough should slowly get better instead of worse. If you develop a cough productive of dark or red  sputum, new shortness of breath, new chest tightness, new fevers, etc - seek additional care. -Seek additional care if worsening headache, new vision changes, worsening/changing lightheadedness or dizziness, etc.      ED Prescriptions     Medication Sig Dispense Auth. Provider   predniSONE  (STERAPRED UNI-PAK 21 TAB) 10 MG (21) TBPK tablet Take by mouth daily. Take 6 tabs by mouth daily  for 2 days, then 5 tabs for 2 days, then 4 tabs for 2 days, then 3 tabs for 2 days, 2 tabs for 2 days, then 1 tab by mouth daily for 2 days 42 tablet Ritu Gagliardo E, PA-C      PDMP not reviewed this encounter.   Arlyss Leita BRAVO, PA-C 04/03/24 1104

## 2024-04-03 NOTE — Telephone Encounter (Signed)
 She needs to set up follow up- has not been seen in office in over a year

## 2024-04-03 NOTE — Discharge Instructions (Addendum)
-  Prednisone  taper for cough/asthma. I recommend taking this in the morning as it could give you energy.  Avoid NSAIDs like ibuprofen  and alleve while taking this medication as they can increase your risk of stomach upset and even GI bleeding when in combination with a steroid. You can continue tylenol  (acetaminophen ) up to 1000mg  3x daily. -Albuterol  inhaler that you have at home already as needed for cough, wheezing, shortness of breath, 1 to 2 puffs every 6 hours as needed. -Follow-up with PCP to discuss daily steroid inhaler or pulm referral - We administered a shot of Toradol  today.  This is an NSAID pain medication, similar to ibuprofen . - Because we administered the Toradol  today, please avoid NSAIDs like ibuprofen  or Advil  for the rest of the day. -You can still take Tylenol  1000mg  up to 3x daily -Follow-up in 2 days if your symptoms have not improved, or have worsened. Your cough should slowly get better instead of worse. If you develop a cough productive of dark or red sputum, new shortness of breath, new chest tightness, new fevers, etc - seek additional care. -Seek additional care if worsening headache, new vision changes, worsening/changing lightheadedness or dizziness, etc.

## 2024-04-03 NOTE — Telephone Encounter (Signed)
 Copied from CRM #[id number]. Topic: Clinical - Medication Question >> Apr 03, 2024 10:29 AM Zane F wrote: (This message is an [employer] for the attached patient):   Concern: Repeated asthma attacks (3rd one this week)   (Specialist confirmed with patient she is actively in the Urgent Care being treated but needs to be called back to schedule follow up for nebulizer prescription. Patient had to go due to physician coming in to discuss treatment while at the urgent care. Patient was informed to call the office back when she is out of the Urgent care)   Symptoms:  shortness of breath  When did the symptoms start?: a month ago  What have you done to aid in the concern ? Have you taken anything to assist with the matter?: Yes  If so, what did you take?: Rescue inhaler ( looking to be scheduled for an appointment to be prescribed a regular nebulizer medication)  Callback Number: [phone]

## 2024-04-03 NOTE — ED Triage Notes (Signed)
 Pt presents with complaints of dizziness and SOB. Symptoms began yesterday at 4 PM. States she has been very congested since Friday 11/21. Took 4-5 puffs of albuterol  inhaler prior to the dizziness/SOB starting. Reports she works in [employer], very dusty. Triggers her asthma. Tylenol  taken too, little improvement. No fevers.

## 2024-04-03 NOTE — Telephone Encounter (Signed)
 Patient is being seen today at Urgent Care

## 2024-04-05 ENCOUNTER — Ambulatory Visit: Admitting: Occupational Therapy

## 2024-04-05 DIAGNOSIS — M6281 Muscle weakness (generalized): Secondary | ICD-10-CM

## 2024-04-05 DIAGNOSIS — R29818 Other symptoms and signs involving the nervous system: Secondary | ICD-10-CM

## 2024-04-05 DIAGNOSIS — R29898 Other symptoms and signs involving the musculoskeletal system: Secondary | ICD-10-CM

## 2024-04-05 DIAGNOSIS — R278 Other lack of coordination: Secondary | ICD-10-CM | POA: Diagnosis not present

## 2024-04-05 DIAGNOSIS — M79641 Pain in right hand: Secondary | ICD-10-CM

## 2024-04-05 DIAGNOSIS — R208 Other disturbances of skin sensation: Secondary | ICD-10-CM

## 2024-04-05 NOTE — Therapy (Signed)
 OUTPATIENT OCCUPATIONAL THERAPY ORTHO TREATMENT  Patient Name: Kaitlyn Luna MRN: [id number] DOB:17-Dec-1990, 33 y.o., female Today's Date: 04/05/2024  PCP: [name], NP  REFERRING PROVIDER: [name], MD  END OF SESSION:  OT End of Session - 04/05/24 1024     Visit Number 5    Number of Visits 11    Date for Recertification  04/21/24    Authorization Type [employer] - auth required per appt note    OT Start Time 1022    OT Stop Time 1103    OT Time Calculation (min) 41 min    Activity Tolerance Patient tolerated treatment well    Behavior During Therapy [facility] for tasks assessed/performed         Past Medical History:  Diagnosis Date   Anemia    Anxiety    Asthma    Bipolar depression (HCC)    Depression    Headaches, cluster 04/2022   Kidney stone    Pneumonia 2017   PTSD (post-traumatic stress disorder)    Recurrent UTI    Sleep apnea    not using c-pap   Past Surgical History:  Procedure Laterality Date   BREAST SURGERY  2013   REDUCTI0N   CARPAL TUNNEL RELEASE Left    CERVICAL CONIZATION W/BX N/A 05/27/2017   Procedure: CONIZATION CERVIX WITH BIOPSY - COLD KNIFE;  Surgeon: [name], MD;  Location: [facility];  Service: Gynecology;  Laterality: N/A;   KNEE ARTHROSCOPY Left    TONSILLECTOMY     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   ONSET DATE: 01/20/2024 (Date of referral)  DOS - 01/27/2024   REFERRING DIAG: G56.01 (ICD-10-CM) - Carpal tunnel syndrome, right upper limb    THERAPY DIAG:  Other lack of coordination  Muscle weakness (generalized)  Pain in right hand  Other symptoms and signs involving the musculoskeletal system  Other disturbances of skin sensation  Other symptoms and signs involving the nervous system  Rationale for Evaluation and Treatment: Rehabilitation  SUBJECTIVE:   SUBJECTIVE STATEMENT: Pt reports she is having a lot more feeling today. She reports decreased sensitivity with weight bearing through R palm.  Pt accompanied by: self  PERTINENT HISTORY: PMH: PTSD, major depressive disorder, morbid obesity, L CTR, L knee arthroplasty, hx of smoking, sleep apnea, and migraines.   Per referring provider:  Shifra Luna is a 33 y.o. female who presents today for follow up 2 weeks status post right wrist open carpal tunnel release.  Doing well overall, does have some ongoing hypersensitivity at the incisional site.   Pertinent ROS were reviewed with the patient and found to be negative unless otherwise specified above in HPI.   Sutures removed today. She will be seen by occupational therapy to begin range of motion exercises and transition home exercise program when appropriate. Follow-up myself in approximate 4 weeks.   PRECAUTIONS: Follow IHP page 382  WEIGHT BEARING RESTRICTIONS: No  PAIN:  Are you having pain? Yes: NPRS scale: 2/10 Pain location: R thumb Pain description: throbbing Aggravating factors: night and with weather changes;  driving Relieving factors: medication and ice  Worst pain in last 24 hours: 4/10  FALLS: Has patient fallen in last 6 months? 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 03/06/2024 at 8:45  OBJECTIVE:  Note: Objective measures were completed at Evaluation unless  otherwise noted.  HAND DOMINANCE: Left  ADLs: Mod I  FUNCTIONAL OUTCOME MEASURES: Quick Dash: 59.1 % disability with use of RUE   UPPER EXTREMITY ROM:    RUE: WFL  HAND FUNCTION: Grip strength: Right: 16.3 lbs; Left: 54.2 lbs  COORDINATION: 9 Hole Peg test: Right: 25 sec; Left: 21 sec  SENSATION: Paresthesias in R hand reported  EDEMA: mild reported and observed  COGNITION: Overall cognitive status: Within functional limits for tasks assessed  OBSERVATIONS: Pt ambulates without use of AD. No loss of balance. The pt appears well kept. R palmar incision not fully approximated with trace clear drainage. No erythema or signs of infection.   TREATMENT:  - Self-care/home management completed for duration as noted below including: OT educated patient in Safety considerations for loss of sensation as noted in patient instructions to reduce risk of injury to affected hand.  Pt encouraged to be careful of sharp, hot/cold (check temperatures of water), breakable, heavy objects and chemicals. Patient verbalized understanding. Handout provided.   OT educated pt on joint protection, ergonomics, and optometrist principles as noted in pt instructions as needed to improve UE pain.  - Manual therapy completed for duration as noted below including:  Performed dynamic and static cupping at site of incision and surrounding area to decrease scar tissue adhesions by mobilizing the soft- tissues such as skin, fascia, neural tissues, muscles, ligaments and tendons and to promote local circulation necessary for optimal functional movement by lifting and separating the tissue underneath the cup. Improved appearance to incision noted upon completion with less palpable adhesions.  Therapist completed IASTM using tissue mover tool at site of incision using free up lotion as emollient for reduction of scar tissue and to promote improved AROM and pain reduction of affected extremity. Improved appearance to  incision noted upon completion with less palpable adhesions.                                                             PATIENT EDUCATION: Education details: joint protection; sensory precautions; scar management Person educated: Patient Education method: Explanation, Demonstration, Verbal cues, and Handouts Education comprehension: verbalized understanding, returned demonstration, verbal cues required, and needs further education  HOME EXERCISE PROGRAM: 02/10/2024: tendon glides 02/24/24: Theraputty with red putty; joint protection 04/05/2024: joint protection; sensory precautions  GOALS:  SHORT TERM GOALS: Target date: 03/09/2024    Patient will demonstrate initial R UE HEP with 25% verbal cues or less for proper execution. Baseline: initiated tendon glides Goal status: MET  2.  Pt will independently recall the 5 main sensory precautions (cold, heat, sharp, chemical, and heavy) as needed to prevent injury/harm secondary to impairments.   Baseline:  Goal status: IN PROGRESS  3.  Pt will independently recall at least 3 joint protection, ergonomics, and body mechanic principles as noted in  pt instructions.   Baseline:  Goal status: IN PROGRESS   LONG TERM GOALS: Target date: 04/21/2024  Patient will demonstrate updated R UE HEP with visual handouts only for proper execution. Baseline:  Goal status: INITIAL  2.  Patient will demonstrate at least 16% improvement with quick Dash score (reporting 43.1% disability or less) indicating improved functional use of affected extremity. Baseline: 59.1 % disability with use of RUE Goal status: INITIAL  3.  Pt to increase R grip strength by at least 30 pounds for improved functional use in dominant hand. Baseline: 16.3 lbs Goal status: MODIFIED  ASSESSMENT:  CLINICAL IMPRESSION: Pt demonstrates good tolerance to manual therapy in efforts to promote sensory improvement in R hand following CTR. She verbalizes good understanding of  precautions and joint protection recommendations to further reduce s/s of CTS. Will focus on strengthening and endurance with functional use.   PERFORMANCE DEFICITS: in functional skills including ADLs, IADLs, coordination, sensation, edema, ROM, strength, pain, Fine motor control, decreased knowledge of precautions, wound, skin integrity, and UE functional use.   IMPAIRMENTS: are limiting patient from ADLs, IADLs, work, and leisure.   COMORBIDITIES: may have co-morbidities  that affects occupational performance. Patient will benefit from skilled OT to address above impairments and improve overall function.  REHAB POTENTIAL: Good  PLAN:  OT FREQUENCY: 1x/week  OT DURATION: 10 weeks  PLANNED INTERVENTIONS: 97168 OT Re-evaluation, 97535 self care/ADL training, 02889 therapeutic exercise, 97530 therapeutic activity, 97112 neuromuscular re-education, 97140 manual therapy, 97035 ultrasound, 97018 paraffin, 02960 fluidotherapy, 97010 moist heat, 97032 electrical stimulation (manual), 97750 Physical Performance Testing, 02239 Orthotic Initial, [id number] Orthotic/Prosthetic subsequent, scar mobilization, passive range of motion, coping strategies training, patient/family education, and DME and/or AE instructions  RECOMMENDED OTHER SERVICES: N/A for this visit  CONSULTED AND AGREED WITH PLAN OF CARE: Patient  PLAN FOR NEXT SESSION: assess remaining STGs True balance, Looper, Mindful Maze, Jux-a-cisor, power web push downs  Grip strengthening activities AE recommendations; sensory precautions, joint protection  Jocelyn CHRISTELLA Bottom, OT 04/05/2024, 11:31 AM   For all possible CPT codes, reference the Planned Interventions line above.     Check all conditions that are expected to impact treatment: {Conditions expected to impact treatment:Morbid obesity, Diabetes mellitus, and Psychological or psychiatric disorders   If treatment provided at initial evaluation, no treatment charged due to lack of  authorization.

## 2024-04-05 NOTE — Patient Instructions (Addendum)
 SABRA

## 2024-04-10 ENCOUNTER — Ambulatory Visit: Admitting: Adult Health

## 2024-04-10 ENCOUNTER — Encounter: Payer: Self-pay | Admitting: Adult Health

## 2024-04-10 VITALS — BP 116/72 | HR 79 | Temp 97.5°F | Ht 67.0 in | Wt 376.8 lb

## 2024-04-10 DIAGNOSIS — J4541 Moderate persistent asthma with (acute) exacerbation: Secondary | ICD-10-CM | POA: Diagnosis not present

## 2024-04-10 DIAGNOSIS — G44029 Chronic cluster headache, not intractable: Secondary | ICD-10-CM

## 2024-04-10 MED ORDER — BUDESONIDE-FORMOTEROL FUMARATE 160-4.5 MCG/ACT IN AERO: 2.0000 | INHALATION_SPRAY | Freq: Two times a day (BID) | RESPIRATORY_TRACT | 5 refills | Status: AC

## 2024-04-10 MED ORDER — SUMATRIPTAN SUCCINATE 50 MG PO TABS: 50.0000 mg | ORAL_TABLET | ORAL | 3 refills | Status: AC | PRN

## 2024-04-10 NOTE — Progress Notes (Signed)
 [facility] clinic  Provider:  Jereld Serum DNP  Code Status:  Full Code  Goals of Care:     04/10/2024   10:31 AM  Advanced Directives  Does Patient Have a Medical Advance Directive? No  Would patient like information on creating a medical advance directive? No - Patient declined     Chief Complaint  Patient presents with   Medical Management of Chronic Issues    Reoccurring asthma attacks. Monday she did go to urgent care and get a nebulizer treatment   Discussed the use of AI scribe software for clinical note transcription with the patient, who gave verbal consent to proceed.  HPI: Patient is a 33 y.o. female seen today for an acute visit for re occurring asthma attacks.  She has been experiencing worsening asthma symptoms, using her albuterol  inhaler as needed, but it has not been effective. A severe asthma attack occurred a week ago, requiring four puffs of her inhaler without relief, leading her to leave work early. She visited urgent care the following Monday, where she received a nebulizer treatment, which provided significant relief.  Today, she feels 'really sick' with nasal congestion but denies having a fever. She experiences wheezing, shortness of breath, and a mild cough, with no fever or swelling in her feet.  She also reports experiencing more frequent migraines, which she attributes to difficulty breathing. She uses Imitrex  as needed for her migraines and received a pain medication shot at urgent care for a migraine.  Her family history includes lung cancer and COPD. She works at [employer], which she describes as 'super dusty, crusty,' potentially exacerbating her symptoms.  She has a history of carpal tunnel surgery and is currently undergoing physical therapy for her hands, which she reports are improving.    Past Medical History:  Diagnosis Date   Anemia    Anxiety    Asthma    Bipolar depression (HCC)    Depression    Headaches, cluster 04/2022    Kidney stone    Pneumonia 2017   PTSD (post-traumatic stress disorder)    Recurrent UTI    Sleep apnea    not using c-pap    Past Surgical History:  Procedure Laterality Date   BREAST SURGERY  2013   REDUCTI0N   CARPAL TUNNEL RELEASE Left    CERVICAL CONIZATION W/BX N/A 05/27/2017   Procedure: CONIZATION CERVIX WITH BIOPSY - COLD KNIFE;  Surgeon: [name], MD;  Location: [facility];  Service: Gynecology;  Laterality: N/A;   KNEE ARTHROSCOPY Left    TONSILLECTOMY      Allergies  Allergen Reactions   Lavender Oil Hives    Outpatient Encounter Medications as of 04/10/2024  Medication Sig   albuterol  (VENTOLIN  HFA) 108 (90 Base) MCG/ACT inhaler Inhale 1-2 puffs into the lungs every 6 (six) hours as needed for wheezing or shortness of breath.   budesonide-formoterol (SYMBICORT) 160-4.5 MCG/ACT inhaler Inhale 2 puffs into the lungs 2 (two) times daily.   Etonogestrel  (NEXPLANON  Henry) Inject into the skin.   ondansetron  (ZOFRAN -ODT) 4 MG disintegrating tablet Take 1 tablet (4 mg total) by mouth every 8 (eight) hours as needed.   pseudoephedrine  (SUDAFED) 60 MG tablet Take 1 tablet (60 mg total) by mouth every 8 (eight) hours as needed for congestion.   [DISCONTINUED] fluticasone  (FLOVENT  HFA) 110 MCG/ACT inhaler Inhale 1 puff into the lungs in the morning and at bedtime.   oxycodone  (OXY-IR) 5 MG capsule Take 1 capsule (5 mg total)  by mouth every 6 (six) hours as needed. (Patient not taking: Reported on 04/10/2024)   predniSONE  (STERAPRED UNI-PAK 21 TAB) 10 MG (21) TBPK tablet Take by mouth daily. Take 6 tabs by mouth daily  for 2 days, then 5 tabs for 2 days, then 4 tabs for 2 days, then 3 tabs for 2 days, 2 tabs for 2 days, then 1 tab by mouth daily for 2 days (Patient not taking: Reported on 04/10/2024)   promethazine -dextromethorphan (PROMETHAZINE -DM) 6.25-15 MG/5ML syrup Take 5 mLs by mouth 3 (three) times daily as needed for cough. (Patient not taking: Reported on 04/10/2024)    SUMAtriptan  (IMITREX ) 50 MG tablet Take 1 tablet (50 mg total) by mouth as needed for headache (take as needed for your headaches, try to take near onset of a severe headache to keep it from progressing). May repeat in 2 hours if headache persists or recurs.   [DISCONTINUED] SUMAtriptan  (IMITREX ) 50 MG tablet Take 1 tablet (50 mg total) by mouth as needed for headache (take as needed for your headaches, try to take near onset of a severe headache to keep it from progressing). May repeat in 2 hours if headache persists or recurs.   No facility-administered encounter medications on file as of 04/10/2024.    Review of Systems:  Review of Systems  Constitutional:  Negative for appetite change, chills, fatigue and fever.  HENT:  Negative for congestion, hearing loss, rhinorrhea and sore throat.   Eyes: Negative.   Respiratory:  Positive for cough, shortness of breath and wheezing.   Cardiovascular:  Negative for chest pain, palpitations and leg swelling.  Gastrointestinal:  Negative for abdominal pain, constipation, diarrhea, nausea and vomiting.  Genitourinary:  Negative for dysuria.  Musculoskeletal:  Negative for arthralgias, back pain and myalgias.  Skin:  Negative for color change, rash and wound.  Neurological:  Negative for dizziness, weakness and headaches.  Psychiatric/Behavioral:  Negative for behavioral problems. The patient is not nervous/anxious.     Health Maintenance  Topic Date Due   COVID-19 Vaccine (3 - 2025-26 season) 04/26/2024 (Originally 01/10/2024)   Influenza Vaccine  08/08/2024 (Originally 12/10/2023)   Pneumococcal Vaccine (1 of 2 - PCV) 04/10/2025 (Originally 11/21/2008)   Hepatitis B Vaccines 19-59 Average Risk (1 of 3 - 19+ 3-dose series) 04/10/2025 (Originally 11/21/2008)   HPV VACCINES (2 - Risk 3-dose series) 04/10/2025 (Originally 03/24/2017)   Hepatitis C Screening  04/10/2025 (Originally 11/22/2007)   Cervical Cancer Screening (HPV/Pap Cotest)  05/28/2026    DTaP/Tdap/Td (2 - Td or Tdap) 01/09/2028   HIV Screening  Completed   Meningococcal B Vaccine  Aged Out    Physical Exam: Vitals:   04/10/24 1034  BP: 116/72  Pulse: 79  Temp: (!) 97.5 F (36.4 C)  TempSrc: Temporal  SpO2: 95%  Weight: (!) 376 lb 12.8 oz (170.9 kg)  Height: 5' 7 (1.702 m)   Body mass index is 59.02 kg/m. Physical Exam Constitutional:      General: She is not in acute distress.    Appearance: She is obese.  HENT:     Head: Normocephalic and atraumatic.     Nose: Nose normal.     Mouth/Throat:     Mouth: Mucous membranes are moist.  Eyes:     Conjunctiva/sclera: Conjunctivae normal.  Cardiovascular:     Rate and Rhythm: Normal rate and regular rhythm.  Pulmonary:     Effort: Pulmonary effort is normal.     Breath sounds: Normal breath sounds.  Abdominal:  General: Bowel sounds are normal.     Palpations: Abdomen is soft.  Musculoskeletal:        General: Normal range of motion.     Cervical back: Normal range of motion.  Skin:    General: Skin is warm and dry.  Neurological:     General: No focal deficit present.     Mental Status: She is alert and oriented to person, place, and time.  Psychiatric:        Mood and Affect: Mood normal.        Behavior: Behavior normal.        Thought Content: Thought content normal.        Judgment: Judgment normal.     Labs reviewed: Basic Metabolic Panel: No results for input(s): NA, K, CL, CO2, GLUCOSE, BUN, CREATININE, CALCIUM, MG, PHOS, TSH in the last 8760 hours. Liver Function Tests: No results for input(s): AST, ALT, ALKPHOS, BILITOT, PROT, ALBUMIN in the last 8760 hours. No results for input(s): LIPASE, AMYLASE in the last 8760 hours. No results for input(s): AMMONIA in the last 8760 hours. CBC: No results for input(s): WBC, NEUTROABS, HGB, HCT, MCV, PLT in the last 8760 hours. Lipid Panel: No results for input(s): CHOL, HDL, LDLCALC,  TRIG, CHOLHDL, LDLDIRECT in the last 8760 hours. Lab Results  Component Value Date   HGBA1C 5.5 05/28/2021    Procedures since last visit: No results found.  Assessment/Plan  1. Moderate persistent asthma with acute exacerbation (Primary) -  with increased frequency and severity. Current albuterol  use insufficient. Work environment may contribute.  - Started Symbicort inhaler, two puffs twice daily. - Continue albuterol  inhaler as rescue, one to two puffs every six hours as needed. - Discontinued Flovent . - Advised to return if symptoms do not improve. - budesonide-formoterol (SYMBICORT) 160-4.5 MCG/ACT inhaler; Inhale 2 puffs into the lungs 2 (two) times daily.  Dispense: 1 each; Refill: 5  2. Chronic cluster headache, not intractable -  Increased frequency, possibly exacerbated by asthma. - continue Imitrex  as needed for relief.  - SUMAtriptan  (IMITREX ) 50 MG tablet; Take 1 tablet (50 mg total) by mouth as needed for headache (take as needed for your headaches, try to take near onset of a severe headache to keep it from progressing). May repeat in 2 hours if headache persists or recurs.  Dispense: 30 tablet; Refill: 3       Labs/tests ordered:  None   Return if symptoms worsen or fail to improve.  Marcin Holte Medina-Vargas, NP

## 2024-04-13 ENCOUNTER — Ambulatory Visit: Attending: Orthopedic Surgery | Admitting: Occupational Therapy

## 2024-04-13 DIAGNOSIS — R29898 Other symptoms and signs involving the musculoskeletal system: Secondary | ICD-10-CM | POA: Insufficient documentation

## 2024-04-13 DIAGNOSIS — R278 Other lack of coordination: Secondary | ICD-10-CM | POA: Insufficient documentation

## 2024-04-13 DIAGNOSIS — M6281 Muscle weakness (generalized): Secondary | ICD-10-CM | POA: Insufficient documentation

## 2024-04-13 NOTE — Therapy (Signed)
 OUTPATIENT OCCUPATIONAL THERAPY ORTHO TREATMENT  Patient Name: Kaitlyn Luna MRN: [id number] DOB:[birth year], 33 y.o., female Today's Date: 04/13/2024  PCP: [name], NP  REFERRING PROVIDER: [name], MD  END OF SESSION:  OT End of Session - 04/13/24 1019     Visit Number 6    Number of Visits 11    Date for Recertification  04/21/24    Authorization Type [employer] - auth required per appt note    OT Start Time 1019    OT Stop Time 1057    OT Time Calculation (min) 38 min    Equipment Utilized During Treatment Testing material    Activity Tolerance Patient tolerated treatment well    Behavior During Therapy WFL for tasks assessed/performed         Past Medical History:  Diagnosis Date   Anemia    Anxiety    Asthma    Bipolar depression (HCC)    Depression    Headaches, cluster 04/2022   Kidney stone    Pneumonia 2017   PTSD (post-traumatic stress disorder)    Recurrent UTI    Sleep apnea    not using c-pap   Past Surgical History:  Procedure Laterality Date   BREAST SURGERY  2013   REDUCTI0N   CARPAL TUNNEL RELEASE Left    CERVICAL CONIZATION W/BX N/A 05/27/2017   Procedure: CONIZATION CERVIX WITH BIOPSY - COLD KNIFE;  Surgeon: [name], MD;  Location: [facility];  Service: Gynecology;  Laterality: N/A;   KNEE ARTHROSCOPY Left    TONSILLECTOMY     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 She does not have any further numbness or sensitivity with weight bearing through R palm and can push up from a chair well.  She does not have pain today and had not in the past week.  Her follow up appt with [name] has been R/S to 12/22.  Upon review of OT goals, pt is in agreement with DC from OT today.   Pt accompanied by: self  PERTINENT HISTORY: PMH: PTSD, major depressive disorder, morbid obesity, L CTR, L knee arthroplasty, hx of smoking, sleep apnea, and migraines.   Per referring provider:  Lakesia Dahle is a 33 y.o. female who presents today for follow up 2 weeks status post right wrist open carpal tunnel release.  Doing well overall, does have some ongoing hypersensitivity at the incisional site.   Pertinent ROS were reviewed with the patient and found to be negative unless otherwise specified above in HPI.   Sutures removed today. She will be seen by occupational therapy to begin range of motion exercises and transition home exercise program when appropriate.  Follow-up myself in approximate 4 weeks.   PRECAUTIONS: Follow IHP page 382  WEIGHT BEARING RESTRICTIONS: No  PAIN:  Are you having pain? No and none in past week  FALLS: Has patient fallen in last 6 months? 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 05/01/2024 at 8:45  OBJECTIVE:  Note: Objective measures were completed at Evaluation unless otherwise noted.  HAND  DOMINANCE: Left  ADLs: Mod I  FUNCTIONAL OUTCOME MEASURES: Quick Dash: 59.1 % disability with use of RUE DC: Quick Dash 4.5 % - only difficulty with containers but pt has AE   UPPER EXTREMITY ROM:    RUE: WFL  HAND FUNCTION: Grip strength: Right: 16.3 lbs; Left: 54.2 lbs  04/13/24 Right 36.1, 30.6, 40.5 Left 50.0, 69.4, 63.4 Average: Right: 35.7 lbs; Left: 60.9 lbs  COORDINATION: 9 Hole Peg test: Right: 25 sec; Left: 21 sec  SENSATION: Paresthesias in R hand reported 04/13/24 Numbness is gone  EDEMA: mild reported and observed  COGNITION: Overall cognitive status: Within functional limits for tasks assessed  OBSERVATIONS: Pt ambulates without use of AD. No loss of balance. The pt appears well kept. R palmar incision not fully approximated with trace clear drainage. No erythema or signs of infection.   TREATMENT:  - Self-care/home management completed for duration as noted below including: Therapist reviewed goals with patient and updated patient progression.  No additional functional limitations identified. Pt's grip strength up by 20 lbs on affected RUE 36 lbs from 16 lbs and Quick Dash score down from 59% to 4.5% limitations. OT reiterated various joint protection principles and AE considerations to pt as this was her 2nd carpal tunnel release and she is at risk for further issues if she does not change techniques for ADLs/IADLS.  Pt instructed in nighttime use of splints occasionally as needed to prevent AM sensory changes as well as with daytime use for activities that may affect her wrist ie) vacuuming etc.  Pt encouraged to consider AE options via online search for individuals with arthritis or stoke as these items increase ease of tasks.  She reports having some of the devices discussed due to her mother having a disability. Educated in grasp of pot or laundry basket with neutral wrist as well as modified techniques to slide pot across counter top rather than carrying it.    - Manual therapy completed for duration as noted below including: Therapist completed IASTM using tissue mover tool at site of incision using free up lotion as emollient for reduction of scar tissue and to promote improved AROM and pain reduction of affected extremity. Improved appearance to incision noted upon completion with less palpable adhesions.     - Therapeutic exercises completed for duration as noted below including: Pt engaged in resistance bar exercises with beige flex bar x 5-10 reps each for strength and endurance of affected extremity for the following: -wrist flex and extension - twisting bar forward and backwards to flex and extend wrist/s -supination/pronation - bending the bar on table top side to side to rotate forearm - palm up and down -radial/ulnar deviation - pushing the bar back on forth on table top to tip wrist - from pinkie back to thumb Pt able to perform all motions without difficulty or increased discomfort.  PATIENT EDUCATION: Education details: joint protection;  scar management; DC instructions Person educated: Patient Education method: Explanation, Demonstration, and Verbal cues Education comprehension: verbalized understanding, returned demonstration, and verbal cues required  HOME EXERCISE PROGRAM: 02/10/2024: tendon glides 02/24/24: Theraputty with red putty; joint protection 04/05/2024: joint protection; sensory precautions  GOALS:  SHORT TERM GOALS: Target date: 03/09/2024    Patient will demonstrate initial R UE HEP with 25% verbal cues or less for proper execution. Baseline: initiated tendon glides Goal status: MET  2.  Pt will independently recall the 5 main sensory precautions (cold, heat, sharp, chemical, and heavy) as needed to prevent injury/harm secondary to impairments.   Baseline:  Goal status: MET  3.  Pt will independently recall at least 3 joint protection, ergonomics, and body mechanic principles as noted in pt instructions.    Baseline:  Goal status: MET Positioning while driving use, has wrist support for typing, minimize pinching, adaptive equipment, neutral position    LONG TERM GOALS: Target date: 04/21/2024  Patient will demonstrate updated R UE HEP with visual handouts only for proper execution. Baseline:  Goal status: MET  2.  Patient will demonstrate at least 16% improvement with quick Dash score (reporting 43.1% disability or less) indicating improved functional use of affected extremity. Baseline: 59.1 % disability with use of RUE Goal status: MET 04/13/24: 4.5%  3.  Pt to increase R grip strength by at least 30 pounds for improved functional use in dominant hand. Baseline: 16.3 lbs Goal status: MET 04/13/24: Average: Right: 35.7 lbs; Left: 60.9 lbs  ASSESSMENT:  CLINICAL IMPRESSION: Therapist reviewed goals with patient and updated patient progression.  No additional functional limitations identified.  Patient is appropriate for discharge and no longer demonstrates medical necessity for continued skilled occupational therapy services. PERFORMANCE DEFICITS: in functional skills including ADLs, IADLs, coordination, sensation, edema, ROM, strength, pain, Fine motor control, decreased knowledge of precautions, wound, skin integrity, and UE functional use.   IMPAIRMENTS: are limiting patient from ADLs, IADLs, work, and leisure.   COMORBIDITIES: may have co-morbidities  that affects occupational performance. Patient will benefit from skilled OT to address above impairments and improve overall function.  REHAB POTENTIAL: Good  PLAN:  OCCUPATIONAL THERAPY DISCHARGE SUMMARY  Visits from Start of Care: 6  Current functional level related to goals / functional outcomes: Pt has met all goals to satisfactory levels and is pleased with outcomes.   Remaining deficits: Pt has no more significant functional deficits or pain.   Education / Equipment: Pt has all needed materials and education. Pt  understands how to continue on with self-management. See tx notes for more details.   Patient agrees to discharge due to max benefits received from outpatient occupational therapy / hand therapy at this time.    Kaitlyn Luna, OT 04/13/2024, 2:30 PM

## 2024-04-20 ENCOUNTER — Ambulatory Visit: Admitting: Occupational Therapy

## 2024-04-28 NOTE — Progress Notes (Deleted)
" ° °  Anyia Gierke - 33 y.o. female MRN [id number]  Date of birth: Aug 04, 1990  Office Visit Note: Visit Date: 05/01/2024 PCP: [name], NP Referred by: [name], NP  Subjective:  HPI: Berry Gallacher is a 33 y.o. female who presents today for follow up *** status post ***.  Pertinent ROS were reviewed with the patient and found to be negative unless otherwise specified above in HPI.   Assessment & Plan: Visit Diagnoses: No diagnosis found.  Plan: ***  Follow-up: No follow-ups on file.   Meds & Orders: No orders of the defined types were placed in this encounter.  No orders of the defined types were placed in this encounter.    Procedures: No procedures performed       Objective:   Vital Signs: LMP 02/09/2024 (Exact Date)   Ortho Exam ***  Imaging: No results found.   Anshul Afton Alderton, M.D. [facility], Hand Surgery  "

## 2024-05-01 ENCOUNTER — Ambulatory Visit: Admitting: Orthopedic Surgery

## 2024-05-15 ENCOUNTER — Ambulatory Visit: Payer: Self-pay

## 2024-05-15 NOTE — Telephone Encounter (Signed)
 FYI Only or Action Required?: FYI only for provider: appointment scheduled on 05/16/24.  Patient was last seen in primary care on 04/10/2024 by Medina-Vargas, Jereld BROCKS, NP.  Called Nurse Triage reporting Ankle Injury.  Symptoms began yesterday.  Interventions attempted: OTC medications: Tylenol .  Symptoms are: gradually worsening.  Triage Disposition: See Physician Within 24 Hours  Patient/caregiver understands and will follow disposition?: Yes    Accidentally twisted left ankle while walking on some rocks at work yesterday. This morning with throbbing 4-6/10 pain and mild swelling. No bruising. Still able to walk around. No numbness. Scheduled appt with different provider at home office tomorrow d/t no PCP availability within timeframe. Advised UC or ED for worsening symptoms.     Copied from CRM [id number]. Topic: Clinical - Red Word Triage >> May 15, 2024  4:27 PM Alfonso ORN wrote: Red Word that prompted transfer to Nurse Triage: may have sprain left ankle hurts when walk on it and have swelling . twisted ankle at work Reason for Disposition  [1] Limp when walking AND [2] due to twisting injury  Answer Assessment - Initial Assessment Questions 1. MECHANISM: How did the injury happen? (e.g., twisting injury, direct blow)      Accidentally twisted ankle while walking on some rocks at work yesterday  2. ONSET: When did the injury happen? (e.g., minutes or hours ago)      Yesterday  3. LOCATION: Where is the injury located?      Left ankle  4. APPEARANCE of INJURY: What does the injury look like?      Mild swelling, otherwise appears normal. Nor bruising or discoloration.  5. WEIGHT-BEARING: Can you put weight on that foot? Can you walk (four steps or more)?       Yes  6. SIZE: For cuts, bruises, or swelling, ask: How large is it? (e.g., inches or centimeters; entire joint)      Mild swelling  7. PAIN: Is there pain? If Yes, ask: How bad is the pain?  What  does it keep you from doing? (Scale 0-10; or none, mild, moderate, severe)     4-6/10 sharp pain  8. TETANUS: For any breaks in the skin, ask: When was your last tetanus booster?     N/a  9. OTHER SYMPTOMS: Do you have any other symptoms?      Denies  10. PREGNANCY: Is there any chance you are pregnant? When was your last menstrual period?       Denies, LMP end of December.  Protocols used: Ankle Injury-A-AH

## 2024-05-15 NOTE — Telephone Encounter (Signed)
 Carlo Credit, RN      05/15/24  4:44 PM Appt scheduled tomorrow. See triage note.

## 2024-05-16 ENCOUNTER — Encounter: Payer: Self-pay | Admitting: Adult Health

## 2024-05-16 ENCOUNTER — Ambulatory Visit
Admission: RE | Admit: 2024-05-16 | Discharge: 2024-05-16 | Disposition: A | Source: Ambulatory Visit | Attending: Adult Health | Admitting: Adult Health

## 2024-05-16 ENCOUNTER — Ambulatory Visit (INDEPENDENT_AMBULATORY_CARE_PROVIDER_SITE_OTHER): Admitting: Adult Health

## 2024-05-16 VITALS — BP 118/72 | HR 71 | Temp 97.7°F | Ht 67.0 in | Wt 380.0 lb

## 2024-05-16 DIAGNOSIS — H9202 Otalgia, left ear: Secondary | ICD-10-CM | POA: Diagnosis not present

## 2024-05-16 DIAGNOSIS — J45909 Unspecified asthma, uncomplicated: Secondary | ICD-10-CM | POA: Diagnosis not present

## 2024-05-16 DIAGNOSIS — M25572 Pain in left ankle and joints of left foot: Secondary | ICD-10-CM | POA: Diagnosis not present

## 2024-05-16 NOTE — Progress Notes (Signed)
 "  [facility]  Provider:  Jereld Serum DNP  Code Status:  Full Code  Goals of Care:     04/10/2024   10:31 AM  Advanced Directives  Does Patient Have a Medical Advance Directive? No  Would patient like information on creating a medical advance directive? No - Patient declined     Chief Complaint  Patient presents with   Ankle Injury    Twisted ankle walking on rocks. Occurred on Sunday. Painful when she walks on it   Ear Fullness    Left ear. Describes it dry and crusty and brown stuff comes out    Discussed the use of AI scribe software for clinical note transcription with the patient, who gave verbal consent to proceed.  HPI: Patient is a 34 y.o. female seen today for an acute visit for ankle injury left ear fullness.  She twisted her left ankle yesterday after tripping on rocks at work, where she frequently walks. The pain is rated as 6 out of 10 today and is accompanied by swelling. The pain worsens with walking, making it difficult for her to stand and walk, but driving does not exacerbate it. She has not yet had an x-ray.  She also reports issues with her left ear, which has been dry and crusty for about a week. She has been using peroxide to clean it, resulting in brown discharge. Her hearing is 'sort of' okay.  She has a history of asthma, which is currently stable. She uses Symbicort , inhaling two puffs twice a day, and has albuterol  for as-needed use.    Past Medical History:  Diagnosis Date   Anemia    Anxiety    Asthma    Bipolar depression (HCC)    Depression    Headaches, cluster 04/2022   Kidney stone    Pneumonia 2017   PTSD (post-traumatic stress disorder)    Recurrent UTI    Sleep apnea    not using c-pap    Past Surgical History:  Procedure Laterality Date   BREAST SURGERY  2013   REDUCTI0N   CARPAL TUNNEL RELEASE Left    CERVICAL CONIZATION W/BX N/A 05/27/2017   Procedure: CONIZATION CERVIX WITH BIOPSY - COLD KNIFE;  Surgeon: [name], MD;  Location: [facility];  Service: Gynecology;  Laterality: N/A;   KNEE ARTHROSCOPY Left    TONSILLECTOMY      Allergies[1]  Outpatient Encounter Medications as of 05/16/2024  Medication Sig   albuterol  (VENTOLIN  HFA) 108 (90 Base) MCG/ACT inhaler Inhale 1-2 puffs into the lungs every 6 (six) hours as needed for wheezing or shortness of breath.   budesonide -formoterol  (SYMBICORT ) 160-4.5 MCG/ACT inhaler Inhale 2 puffs into the lungs 2 (two) times daily.   Etonogestrel  (NEXPLANON  [place]) Inject into the skin.   SUMAtriptan  (IMITREX ) 50 MG tablet Take 1 tablet (50 mg total) by mouth as needed for headache (take as needed for your headaches, try to take near onset of a severe headache to keep it from progressing). May repeat in 2 hours if headache persists or recurs.   predniSONE  (STERAPRED UNI-PAK 21 TAB) 10 MG (21) TBPK tablet Take by mouth daily. Take 6 tabs by mouth daily  for 2 days, then 5 tabs for 2 days, then 4 tabs for 2 days, then 3 tabs for 2 days, 2 tabs for 2 days, then 1 tab by mouth daily for 2 days (Patient not taking: Reported on 05/16/2024)   [DISCONTINUED] ondansetron  (ZOFRAN -ODT) 4 MG disintegrating tablet Take  1 tablet (4 mg total) by mouth every 8 (eight) hours as needed. (Patient not taking: Reported on 05/16/2024)   [DISCONTINUED] oxycodone  (OXY-IR) 5 MG capsule Take 1 capsule (5 mg total) by mouth every 6 (six) hours as needed. (Patient not taking: Reported on 05/16/2024)   [DISCONTINUED] promethazine -dextromethorphan (PROMETHAZINE -DM) 6.25-15 MG/5ML syrup Take 5 mLs by mouth 3 (three) times daily as needed for cough. (Patient not taking: Reported on 05/16/2024)   [DISCONTINUED] pseudoephedrine  (SUDAFED) 60 MG tablet Take 1 tablet (60 mg total) by mouth every 8 (eight) hours as needed for congestion. (Patient not taking: Reported on 05/16/2024)   No facility-administered encounter medications on file as of 05/16/2024.    Review of Systems:  Review of Systems  Constitutional:   Negative for appetite change, chills, fatigue and fever.  HENT:  Positive for ear pain. Negative for congestion, hearing loss, rhinorrhea and sore throat.        Left ear pain  Eyes: Negative.   Respiratory:  Negative for cough, shortness of breath and wheezing.   Cardiovascular:  Negative for chest pain, palpitations and leg swelling.  Gastrointestinal:  Negative for abdominal pain, constipation, diarrhea, nausea and vomiting.  Genitourinary:  Negative for dysuria.  Musculoskeletal:  Negative for arthralgias, back pain and myalgias.       Left ankle pain  Skin:  Negative for color change, rash and wound.  Neurological:  Negative for dizziness, weakness and headaches.  Psychiatric/Behavioral:  Negative for behavioral problems. The patient is not nervous/anxious.     Health Maintenance  Topic Date Due   COVID-19 Vaccine (3 - 2025-26 season) 06/01/2024 (Originally 01/10/2024)   Influenza Vaccine  08/08/2024 (Originally 12/10/2023)   Pneumococcal Vaccine (1 of 2 - PCV) 04/10/2025 (Originally 11/21/2008)   Hepatitis B Vaccines 19-59 Average Risk (1 of 3 - 19+ 3-dose series) 04/10/2025 (Originally 11/21/2008)   HPV VACCINES (2 - Risk 3-dose series) 04/10/2025 (Originally 03/24/2017)   Hepatitis C Screening  04/10/2025 (Originally 11/22/2007)   Cervical Cancer Screening (HPV/Pap Cotest)  05/28/2026   DTaP/Tdap/Td (2 - Td or Tdap) 01/09/2028   HIV Screening  Completed   Meningococcal B Vaccine  Aged Out    Physical Exam: Vitals:   05/16/24 0932  BP: 118/72  Pulse: 71  Temp: 97.7 F (36.5 C)  TempSrc: Temporal  SpO2: 97%  Weight: (!) 380 lb (172.4 kg)  Height: 5' 7 (1.702 m)   Body mass index is 59.52 kg/m. Physical Exam Constitutional:      Appearance: She is obese.  HENT:     Head: Normocephalic and atraumatic.     Ears:     Comments: Right ear with mild cerumen, right ear is clean    Nose: Nose normal.     Mouth/Throat:     Mouth: Mucous membranes are moist.  Eyes:      Conjunctiva/sclera: Conjunctivae normal.  Cardiovascular:     Rate and Rhythm: Normal rate and regular rhythm.  Pulmonary:     Effort: Pulmonary effort is normal.     Breath sounds: Normal breath sounds.  Abdominal:     General: Bowel sounds are normal.     Palpations: Abdomen is soft.  Musculoskeletal:        General: Normal range of motion.     Cervical back: Normal range of motion.  Skin:    General: Skin is warm and dry.  Neurological:     General: No focal deficit present.     Mental Status: She is alert and oriented  to person, place, and time.  Psychiatric:        Mood and Affect: Mood normal.        Behavior: Behavior normal.        Thought Content: Thought content normal.        Judgment: Judgment normal.     Labs reviewed: Basic Metabolic Panel: No results for input(s): NA, K, CL, CO2, GLUCOSE, BUN, CREATININE, CALCIUM, MG, PHOS, TSH in the last 8760 hours. Liver Function Tests: No results for input(s): AST, ALT, ALKPHOS, BILITOT, PROT, ALBUMIN in the last 8760 hours. No results for input(s): LIPASE, AMYLASE in the last 8760 hours. No results for input(s): AMMONIA in the last 8760 hours. CBC: No results for input(s): WBC, NEUTROABS, HGB, HCT, MCV, PLT in the last 8760 hours. Lipid Panel: No results for input(s): CHOL, HDL, LDLCALC, TRIG, CHOLHDL, LDLDIRECT in the last 8760 hours. Lab Results  Component Value Date   HGBA1C 5.5 05/28/2021    Procedures since last visit: No results found.  Assessment/Plan  1. Acute left ankle pain (Primary) -  Pain and swelling following twisting injury. Possible fracture or muscle tear. - Ordered left ankle x-ray at [facility]. - Advised rest and avoidance of work until Friday. - Recommended purchasing a brace for support. - Instructed to wrap the ankle with a bandage. - Will consider referral to orthopedics if fracture is confirmed. - DG Ankle Complete  Left  2. Left ear pain -  Dry, crusty appearance with earwax present. No significant hearing impairment. - Advised against using peroxide in the ear. - Recommended cleaning ears with a towel after showering.  3. Moderate asthma, unspecified whether complicated, unspecified whether persistent -  Moderate persistent asthma, well-controlled with Symbicort . - Continue Symbicort , two puffs twice daily. - Use albuterol  as needed for shortness of breath.      Labs/tests ordered:  x-ray of left ankle   Return if symptoms worsen or fail to improve.  Niaomi Cartaya Medina-Vargas, NP      [1]  Allergies Allergen Reactions   Lavender Oil Hives   "

## 2024-05-20 ENCOUNTER — Ambulatory Visit: Payer: Self-pay | Admitting: Adult Health

## 2024-05-20 NOTE — Progress Notes (Signed)
-    no fracture or dislocation, has small bone spur -  may use Tyenol 1,000 mg PO Q 8 hours as needed for pain -  ice pack to left ankle -  wear ankle brace for support

## 2024-05-24 ENCOUNTER — Encounter: Admitting: Physician Assistant

## 2024-05-25 ENCOUNTER — Other Ambulatory Visit (INDEPENDENT_AMBULATORY_CARE_PROVIDER_SITE_OTHER): Payer: Self-pay

## 2024-05-25 ENCOUNTER — Ambulatory Visit: Admitting: Physician Assistant

## 2024-05-25 ENCOUNTER — Encounter: Payer: Self-pay | Admitting: Physician Assistant

## 2024-05-25 DIAGNOSIS — M25562 Pain in left knee: Secondary | ICD-10-CM

## 2024-05-25 DIAGNOSIS — G8929 Other chronic pain: Secondary | ICD-10-CM | POA: Diagnosis not present

## 2024-05-25 MED ORDER — BUPIVACAINE HCL 0.25 % IJ SOLN
2.0000 mL | INTRAMUSCULAR | Status: AC | PRN
  Administered 2024-05-25: 2 mL via INTRA_ARTICULAR

## 2024-05-25 MED ORDER — LIDOCAINE HCL 1 % IJ SOLN
2.0000 mL | INTRAMUSCULAR | Status: AC | PRN
  Administered 2024-05-25: 2 mL

## 2024-05-25 MED ORDER — MELOXICAM 15 MG PO TABS: 15.0000 mg | ORAL_TABLET | Freq: Every day | ORAL | 0 refills | Status: AC

## 2024-05-25 MED ORDER — METHYLPREDNISOLONE ACETATE 40 MG/ML IJ SUSP
40.0000 mg | INTRAMUSCULAR | Status: AC | PRN
  Administered 2024-05-25: 40 mg via INTRA_ARTICULAR

## 2024-05-25 NOTE — Progress Notes (Signed)
 "  Office Visit Note   Patient: Kaitlyn Luna           Date of Birth: [birth year]           MRN: [id number] Visit Date: 05/25/2024              Requested by: [name], NP [address] [place]. [place],  [place] [address] PCP: [name], NP   Assessment & Plan: Visit Diagnoses:  1. Chronic pain of left knee     Plan: Pleasant 34 year old woman comes in today with chronic ankle and knee pain.  No particular injury.  She works on her feet at [employer] and does a lot of stepping at 1 particular gait on uneven surface.  Exam today she does have some degenerative changes of her knee.  This the medial compartment we will go forward with an injection.  Ankle findings are more consistent with posterior tibial tendon dysfunction.  She has planovalgus collapse with too many toes sign.  She is not wearing supportive shoe wear today I encouraged her that she does need to wear supportive shoe wear with arch supports.  Offered her physical therapy which she declined.  May follow-up with me as needed I also called her and said meloxicam  to take instead of the ibuprofen  she knows not to take this with the ibuprofen  to take 1 or the other  Follow-Up Instructions: Return if symptoms worsen or fail to improve.   Orders:  Orders Placed This Encounter  Procedures   XR Knee 1-2 Views Left   Meds ordered this encounter  Medications   meloxicam  (MOBIC ) 15 MG tablet    Sig: Take 1 tablet (15 mg total) by mouth daily.    Dispense:  30 tablet    Refill:  0      Procedures: Large Joint Inj: L knee on 05/25/2024 2:01 PM Indications: pain and diagnostic evaluation Details: 25 G 1.5 in needle, anteromedial approach  Arthrogram: No  Medications: 40 mg methylPREDNISolone  acetate 40 MG/ML; 2 mL lidocaine  1 %; 2 mL bupivacaine  0.25 % Outcome: tolerated well, no immediate complications Procedure, treatment alternatives, risks and benefits explained, specific risks discussed. Consent was  given by the patient.       Clinical Data: No additional findings.   Subjective: No chief complaint on file.   HPI 34 year old woman comes in today with a chief complaint of left ankle and knee pain no particular injury she said her mom has a history of knee issues and ankle issues as does she.  She does take ibuprofen  but she is taking quite a bit of it pain is on the medial side of the left ankle and global in the knee more on the medial side Review of Systems  All other systems reviewed and are negative.    Objective: Vital Signs: There were no vitals taken for this visit.  Physical Exam Constitutional:      Appearance: Normal appearance.  Pulmonary:     Effort: Pulmonary effort is normal.  Skin:    General: Skin is warm and dry.  Neurological:     General: No focal deficit present.     Mental Status: She is alert and oriented to person, place, and time.  Psychiatric:        Mood and Affect: Mood normal.     Ortho Exam Examination of her left ankle she has planovalgus collapse no lateral impingement findings.  She has too many toes sign.  She  has pain and swelling over her posterior tibial tendon but no pain and good strength with inversion.  Has good eversion for dorsiflexion and plantarflexion compartments are soft in comparison goal negative Homans' sign.  Knee she has no effusion no erythema more tender medially than laterally sensation is intact no evidence of any infective process Specialty Comments:  MRI cervical spine without contrast:  INDICATION:  Neck pain for 3 months  TECHNIQUE: Sagittal T1, T2, and STIR images were performed. Axial T2-weighted images.  COMPARISON:  Cervical spine x-rays 09/04/2019  FINDINGS: #  Vertebral bodies: Normal height and alignment. #  Marrow signal: No significant abnormality. #  Cervical spinal cord: No mass or abnormal signal intensity.  No spinal cord compression. #  Craniovertebral junction: Normal. #    #  C2-3:  Normal. #  C3-4: Normal. #  C4-5: Normal. #  C5-6: Normal. #  C6-7: Normal. #  C7-T1: Normal.   IMPRESSION: Normal MRI of the cervical spine.  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   Past Medical History:  Diagnosis Date   Anemia    Anxiety    Asthma    Bipolar depression (HCC)    Depression    Headaches, cluster 04/2022   Kidney stone    Pneumonia 2017   PTSD (post-traumatic stress disorder)    Recurrent UTI    Sleep apnea    not using c-pap    Family History  Problem Relation Age of Onset   Arthritis Mother    Diabetes Father    Alzheimer's disease Father    Alzheimer's disease Maternal Grandfather    Stroke Paternal Grandmother    Dementia Paternal Grandfather     Past Surgical History:  Procedure Laterality Date   BREAST SURGERY  2013   [id number]   CARPAL TUNNEL RELEASE Left    CERVICAL CONIZATION W/BX N/A 05/27/2017   Procedure: CONIZATION CERVIX WITH BIOPSY - COLD KNIFE;  Surgeon: [name], MD;  Location: [facility];  Service: Gynecology;  Laterality: N/A;   KNEE ARTHROSCOPY Left    TONSILLECTOMY     Social History   Occupational History   Not on file  Tobacco Use   Smoking status: Former     Current packs/day: 0.00    Types: Cigarettes    Quit date: 12/07/2015    Years since quitting: 8.4   Smokeless tobacco: Never  Vaping Use   Vaping status: Never Used  Substance and Sexual Activity   Alcohol use: Not Currently    Comment: occ   Drug use: No   Sexual activity: Not Currently    Birth control/protection: Implant, Condom    Comment: last nexplanon  placed 07/28/22        "
